# Patient Record
Sex: Female | Born: 1990
Health system: Southern US, Community
[De-identification: ages and names within clinical notes are randomized; demographics above are authoritative.]

## PROBLEM LIST (undated history)

## (undated) ENCOUNTER — Inpatient Hospital Stay (HOSPITAL_COMMUNITY): Payer: Self-pay

## (undated) DIAGNOSIS — T7801XA Anaphylactic reaction due to peanuts, initial encounter: Secondary | ICD-10-CM

## (undated) DIAGNOSIS — F329 Major depressive disorder, single episode, unspecified: Secondary | ICD-10-CM

## (undated) DIAGNOSIS — F32A Depression, unspecified: Secondary | ICD-10-CM

## (undated) DIAGNOSIS — J45909 Unspecified asthma, uncomplicated: Secondary | ICD-10-CM

## (undated) DIAGNOSIS — R51 Headache: Secondary | ICD-10-CM

## (undated) HISTORY — PX: NO PAST SURGERIES: SHX2092

---
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
## 2011-02-06 LAB — URINALYSIS, ROUTINE W REFLEX MICROSCOPIC
Bilirubin Urine: NEGATIVE
Glucose, UA: NEGATIVE mg/dL
Glucose, UA: NEGATIVE mg/dL
Hgb urine dipstick: NEGATIVE
Protein, ur: NEGATIVE mg/dL
Urobilinogen, UA: 1 mg/dL (ref 0.0–1.0)
pH: 6 (ref 5.0–8.0)

## 2011-02-06 LAB — URINE MICROSCOPIC-ADD ON

## 2011-02-06 LAB — POCT PREGNANCY, URINE: Preg Test, Ur: NEGATIVE

## 2011-11-27 NOTE — L&D Delivery Note (Signed)
Delivery Note At 6:56 AM a viable and healthy female was delivered via Vaginal, Vacuum (Extractor) due to decreased fetal heart tones not returning to baseline, no pop offs (Presentation: ; Occiput Anterior).  Spontaneous cry at delivery;  APGAR: 8, 9; weight .   Placenta status: Intact, Spontaneous.  Cord: 3 vessels with the following complications: None.  Cord pH: n/a  Anesthesia: Epidural  Episiotomy: None Lacerations: 2nd degree Suture Repair: 3.0; repaired with Monocryl Est. Blood Loss (mL): 425  Mom to postpartum.  Baby to nursery-stable.  [facility] 05/15/2012, 7:19 AM

## 2012-02-02 ENCOUNTER — Encounter (HOSPITAL_COMMUNITY): Payer: Self-pay | Admitting: *Deleted

## 2012-02-02 ENCOUNTER — Inpatient Hospital Stay (HOSPITAL_COMMUNITY)
Admission: AD | Admit: 2012-02-02 | Discharge: 2012-02-02 | Disposition: A | Discharge status: Home Health | Payer: Medicaid Other | Source: Ambulatory Visit | Attending: Obstetrics & Gynecology | Admitting: Obstetrics & Gynecology

## 2012-02-02 DIAGNOSIS — M545 Low back pain, unspecified: Secondary | ICD-10-CM

## 2012-02-02 DIAGNOSIS — R1084 Generalized abdominal pain: Secondary | ICD-10-CM

## 2012-02-02 DIAGNOSIS — O2390 Unspecified genitourinary tract infection in pregnancy, unspecified trimester: Secondary | ICD-10-CM | POA: Insufficient documentation

## 2012-02-02 DIAGNOSIS — N949 Unspecified condition associated with female genital organs and menstrual cycle: Secondary | ICD-10-CM | POA: Insufficient documentation

## 2012-02-02 DIAGNOSIS — N760 Acute vaginitis: Secondary | ICD-10-CM | POA: Insufficient documentation

## 2012-02-02 DIAGNOSIS — Z3480 Encounter for supervision of other normal pregnancy, unspecified trimester: Secondary | ICD-10-CM

## 2012-02-02 DIAGNOSIS — B9689 Other specified bacterial agents as the cause of diseases classified elsewhere: Secondary | ICD-10-CM | POA: Insufficient documentation

## 2012-02-02 DIAGNOSIS — A499 Bacterial infection, unspecified: Secondary | ICD-10-CM | POA: Insufficient documentation

## 2012-02-02 DIAGNOSIS — R109 Unspecified abdominal pain: Secondary | ICD-10-CM | POA: Insufficient documentation

## 2012-02-02 LAB — URINALYSIS, ROUTINE W REFLEX MICROSCOPIC
Glucose, UA: NEGATIVE mg/dL
Hgb urine dipstick: NEGATIVE
Leukocytes, UA: NEGATIVE
Nitrite: NEGATIVE
pH: 6 (ref 5.0–8.0)

## 2012-02-02 MED ORDER — METRONIDAZOLE 500 MG PO TABS: 500.0000 mg | ORAL_TABLET | Freq: Two times a day (BID) | ORAL | Status: AC

## 2012-02-02 NOTE — Progress Notes (Signed)
Pt states, " My last normal period was in September and I had spotting Oct, Nov, and Dec. I had a positive pregnancy test in Jan. I've been having worse low back pain and pelvic pain for the past two week."
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
## 2012-02-05 NOTE — ED Provider Notes (Signed)
Attestation of Attending Supervision of Advanced Practitioner: Evaluation and management procedures were performed by the PA/NP/CNM/OB Fellow under my supervision/collaboration. Chart reviewed, and agree with management and plan.  Jaynie Collins, M.D. 02/05/2012 10:20 AM

## 2012-03-26 ENCOUNTER — Other Ambulatory Visit (HOSPITAL_COMMUNITY): Payer: Self-pay | Admitting: Nurse Practitioner

## 2012-03-26 DIAGNOSIS — Z3689 Encounter for other specified antenatal screening: Secondary | ICD-10-CM

## 2012-03-26 LAB — OB RESULTS CONSOLE HIV ANTIBODY (ROUTINE TESTING): HIV: NONREACTIVE

## 2012-03-26 LAB — OB RESULTS CONSOLE RPR: RPR: NONREACTIVE

## 2012-03-26 LAB — OB RESULTS CONSOLE GC/CHLAMYDIA: Chlamydia: NEGATIVE

## 2012-03-27 ENCOUNTER — Ambulatory Visit (HOSPITAL_COMMUNITY)
Admission: RE | Admit: 2012-03-27 | Discharge: 2012-03-27 | Disposition: A | Discharge status: Home / Self Care | Payer: Medicaid Other | Source: Ambulatory Visit | Attending: Nurse Practitioner | Admitting: Nurse Practitioner

## 2012-03-27 DIAGNOSIS — Z363 Encounter for antenatal screening for malformations: Secondary | ICD-10-CM | POA: Insufficient documentation

## 2012-03-27 DIAGNOSIS — Z3689 Encounter for other specified antenatal screening: Secondary | ICD-10-CM

## 2012-03-27 DIAGNOSIS — O358XX0 Maternal care for other (suspected) fetal abnormality and damage, not applicable or unspecified: Secondary | ICD-10-CM | POA: Insufficient documentation

## 2012-03-27 DIAGNOSIS — Z1389 Encounter for screening for other disorder: Secondary | ICD-10-CM | POA: Insufficient documentation

## 2012-03-27 DIAGNOSIS — O0930 Supervision of pregnancy with insufficient antenatal care, unspecified trimester: Secondary | ICD-10-CM | POA: Insufficient documentation

## 2012-04-25 LAB — OB RESULTS CONSOLE GBS: GBS: NEGATIVE

## 2012-05-14 ENCOUNTER — Inpatient Hospital Stay (HOSPITAL_COMMUNITY)
Admission: AD | Admit: 2012-05-14 | Discharge: 2012-05-17 | DRG: 775 | Disposition: A | Discharge status: Home / Self Care | Payer: Medicaid Other | Source: Ambulatory Visit | Attending: Obstetrics & Gynecology | Admitting: Obstetrics & Gynecology

## 2012-05-14 ENCOUNTER — Encounter (HOSPITAL_COMMUNITY): Payer: Self-pay | Admitting: *Deleted

## 2012-05-14 DIAGNOSIS — O4290 Premature rupture of membranes, unspecified as to length of time between rupture and onset of labor, unspecified weeks of gestation: Secondary | ICD-10-CM | POA: Diagnosis present

## 2012-05-14 HISTORY — DX: Unspecified asthma, uncomplicated: J45.909

## 2012-05-14 HISTORY — DX: Headache: R51

## 2012-05-14 LAB — CBC
HCT: 36.1 % (ref 36.0–46.0)
Hemoglobin: 12 g/dL (ref 12.0–15.0)
MCHC: 33.2 g/dL (ref 30.0–36.0)
RBC: 4.4 MIL/uL (ref 3.87–5.11)

## 2012-05-14 LAB — POCT FERN TEST: Fern Test: POSITIVE

## 2012-05-14 MED ORDER — ONDANSETRON HCL 4 MG/2ML IJ SOLN
4.0000 mg | Freq: Four times a day (QID) | INTRAMUSCULAR | Status: DC | PRN
  Administered 2012-05-15: 4 mg via INTRAVENOUS
  Filled 2012-05-14: qty 2

## 2012-05-14 MED ORDER — LACTATED RINGERS IV SOLN
500.0000 mL | INTRAVENOUS | Status: DC | PRN
  Administered 2012-05-15: 300 mL via INTRAVENOUS

## 2012-05-14 MED ORDER — BUTORPHANOL TARTRATE 2 MG/ML IJ SOLN: 1.0000 mg | Freq: Once | INTRAMUSCULAR | Status: DC

## 2012-05-14 MED ORDER — LIDOCAINE HCL (PF) 1 % IJ SOLN
30.0000 mL | INTRAMUSCULAR | Status: DC | PRN
  Filled 2012-05-14: qty 30

## 2012-05-14 MED ORDER — OXYTOCIN 40 UNITS IN LACTATED RINGERS INFUSION - SIMPLE MED
62.5000 mL/h | Freq: Once | INTRAVENOUS | Status: AC
  Administered 2012-05-15: 999 mL/h via INTRAVENOUS
  Filled 2012-05-14: qty 1000

## 2012-05-14 MED ORDER — LACTATED RINGERS IV SOLN
INTRAVENOUS | Status: DC
  Administered 2012-05-14 – 2012-05-15 (×3): via INTRAVENOUS

## 2012-05-14 MED ORDER — CITRIC ACID-SODIUM CITRATE 334-500 MG/5ML PO SOLN: 30.0000 mL | ORAL | Status: DC | PRN

## 2012-05-14 MED ORDER — OXYTOCIN BOLUS FROM INFUSION
250.0000 mL | Freq: Once | INTRAVENOUS | Status: DC
  Filled 2012-05-14: qty 500

## 2012-05-14 MED ORDER — FLEET ENEMA 7-19 GM/118ML RE ENEM: 1.0000 | ENEMA | RECTAL | Status: DC | PRN

## 2012-05-14 MED ORDER — OXYCODONE-ACETAMINOPHEN 5-325 MG PO TABS
1.0000 | ORAL_TABLET | ORAL | Status: DC | PRN
Start: 2012-05-14 — End: 2012-05-15

## 2012-05-14 MED ORDER — ZOLPIDEM TARTRATE 10 MG PO TABS
10.0000 mg | ORAL_TABLET | Freq: Every evening | ORAL | Status: DC | PRN
  Administered 2012-05-14: 10 mg via ORAL
  Filled 2012-05-14: qty 1

## 2012-05-14 MED ORDER — MISOPROSTOL 25 MCG QUARTER TABLET
25.0000 ug | ORAL_TABLET | ORAL | Status: DC | PRN
  Administered 2012-05-14: 25 ug via VAGINAL
  Filled 2012-05-14: qty 0.25

## 2012-05-14 MED ORDER — BUTORPHANOL TARTRATE 2 MG/ML IJ SOLN
1.0000 mg | Freq: Once | INTRAMUSCULAR | Status: AC
  Administered 2012-05-14: 1 mg via INTRAVENOUS
  Filled 2012-05-14: qty 1

## 2012-05-14 MED ORDER — TERBUTALINE SULFATE 1 MG/ML IJ SOLN: 0.2500 mg | Freq: Once | INTRAMUSCULAR | Status: AC | PRN

## 2012-05-14 MED ORDER — ACETAMINOPHEN 325 MG PO TABS: 650.0000 mg | ORAL_TABLET | ORAL | Status: DC | PRN

## 2012-05-14 MED ORDER — IBUPROFEN 600 MG PO TABS: 600.0000 mg | ORAL_TABLET | Freq: Four times a day (QID) | ORAL | Status: DC | PRN

## 2012-05-14 NOTE — MAU Note (Signed)
Pt had large amt of pink tinged fluid @ 1445.  Has had low backache all week.

## 2012-05-14 NOTE — Progress Notes (Signed)
   Subjective: Pt requesting IV pain meds.  Objective: BP 124/86  Pulse 63  Temp 98 F (36.7 C) (Oral)  Resp 18  Ht 5\' 2"  (1.575 m)  Wt 60.328 kg (133 lb)  BMI 24.33 kg/m2  LMP 08/13/2011      FHT:  FHR: 130's bpm, variability: moderate,  accelerations:  Present,  decelerations:  Absent UC:   irregular, every 2-6 minutes SVE:   1-2  Labs: Lab Results  Component Value Date   WBC 7.2 05/14/2012   HGB 12.0 05/14/2012   HCT 36.1 05/14/2012   MCV 82.0 05/14/2012   PLT 217 05/14/2012    Assessment / Plan: Augmentation of labor, progressing well  Labor: Progressing normally Preeclampsia:  n/a Fetal Wellbeing:  Category I Pain Control:  Stadol I/D:  n/a Anticipated MOD:  NSVD  [facility] 05/14/2012, 11:39 PM

## 2012-05-14 NOTE — Progress Notes (Signed)
Patient ID: [name] [name], female   DOB: 12-Oct-1991, 21 y.o.   MRN: [id number] [name] [name] is a 21 y.o. G1P0 at [redacted]w[redacted]d admitted for PROM  Subjective: Comfortable. Unaware of UCs. Still leaking clear fluids. Hungry.  Objective: BP 127/89  Pulse 68  Temp 97.9 F (36.6 C) (Oral)  Resp 16  Ht 5\' 2"  (1.575 m)  Wt 60.328 kg (133 lb)  BMI 24.33 kg/m2  LMP 08/13/2011  Fetal Heart FHR: 125-130 bpm, variability: moderate,  accelerations:  Present,  decelerations:  Absent   Contractions: q 6 min x 60 sec  SVE:   Dilation: Closed Exam by:: Dr. Jacqulyn Bath  Assessment / Plan:  Labor: Cx unfavorable. Discussed options with pt: will eat and then proceed with cytotec for cx ripening Fetal Wellbeing: Category 1 FHR Pain Control:  N/A Expected mode of delivery: NSVD  Hamp Moreland 05/14/2012, 7:02 PM
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
## 2012-05-15 ENCOUNTER — Encounter (HOSPITAL_COMMUNITY): Payer: Self-pay | Admitting: *Deleted

## 2012-05-15 ENCOUNTER — Encounter (HOSPITAL_COMMUNITY): Payer: Self-pay | Admitting: Anesthesiology

## 2012-05-15 ENCOUNTER — Inpatient Hospital Stay (HOSPITAL_COMMUNITY): Payer: Medicaid Other | Admitting: Anesthesiology

## 2012-05-15 DIAGNOSIS — O4290 Premature rupture of membranes, unspecified as to length of time between rupture and onset of labor, unspecified weeks of gestation: Secondary | ICD-10-CM

## 2012-05-15 LAB — COMPREHENSIVE METABOLIC PANEL
ALT: 9 U/L (ref 0–35)
AST: 16 U/L (ref 0–37)
Albumin: 2.6 g/dL — ABNORMAL LOW (ref 3.5–5.2)
Alkaline Phosphatase: 146 U/L — ABNORMAL HIGH (ref 39–117)
CO2: 25 mEq/L (ref 19–32)
Chloride: 102 mEq/L (ref 96–112)
Creatinine, Ser: 0.66 mg/dL (ref 0.50–1.10)
GFR calc non Af Amer: >90 mL/min (ref >90)
Potassium: 3.8 mEq/L (ref 3.5–5.1)
Total Bilirubin: 0.3 mg/dL (ref 0.3–1.2)

## 2012-05-15 LAB — TYPE AND SCREEN: Antibody Screen: NEGATIVE

## 2012-05-15 LAB — RPR: RPR Ser Ql: NONREACTIVE

## 2012-05-15 MED ORDER — PRENATAL MULTIVITAMIN CH
1.0000 | ORAL_TABLET | Freq: Every day | ORAL | Status: DC
  Administered 2012-05-16 – 2012-05-17 (×2): 1 via ORAL
  Filled 2012-05-15 (×2): qty 1

## 2012-05-15 MED ORDER — PHENYLEPHRINE 40 MCG/ML (10ML) SYRINGE FOR IV PUSH (FOR BLOOD PRESSURE SUPPORT): 80.0000 ug | PREFILLED_SYRINGE | INTRAVENOUS | Status: DC | PRN

## 2012-05-15 MED ORDER — DIBUCAINE 1 % RE OINT: 1.0000 "application " | TOPICAL_OINTMENT | RECTAL | Status: DC | PRN

## 2012-05-15 MED ORDER — EPHEDRINE 5 MG/ML INJ
10.0000 mg | INTRAVENOUS | Status: DC | PRN
  Filled 2012-05-15: qty 4

## 2012-05-15 MED ORDER — LACTATED RINGERS IV SOLN: 500.0000 mL | Freq: Once | INTRAVENOUS | Status: DC

## 2012-05-15 MED ORDER — EPHEDRINE 5 MG/ML INJ: 10.0000 mg | INTRAVENOUS | Status: DC | PRN

## 2012-05-15 MED ORDER — BENZOCAINE-MENTHOL 20-0.5 % EX AERO
1.0000 "application " | INHALATION_SPRAY | CUTANEOUS | Status: DC | PRN
  Administered 2012-05-15: 1 via TOPICAL
  Filled 2012-05-15: qty 56

## 2012-05-15 MED ORDER — BUTORPHANOL TARTRATE 2 MG/ML IJ SOLN: 1.0000 mg | Freq: Once | INTRAMUSCULAR | Status: DC

## 2012-05-15 MED ORDER — LANOLIN HYDROUS EX OINT: TOPICAL_OINTMENT | CUTANEOUS | Status: DC | PRN

## 2012-05-15 MED ORDER — PHENYLEPHRINE 40 MCG/ML (10ML) SYRINGE FOR IV PUSH (FOR BLOOD PRESSURE SUPPORT)
80.0000 ug | PREFILLED_SYRINGE | INTRAVENOUS | Status: DC | PRN
  Filled 2012-05-15: qty 5

## 2012-05-15 MED ORDER — ZOLPIDEM TARTRATE 5 MG PO TABS: 5.0000 mg | ORAL_TABLET | Freq: Every evening | ORAL | Status: DC | PRN

## 2012-05-15 MED ORDER — ONDANSETRON HCL 4 MG PO TABS: 4.0000 mg | ORAL_TABLET | ORAL | Status: DC | PRN

## 2012-05-15 MED ORDER — DIPHENHYDRAMINE HCL 25 MG PO CAPS: 25.0000 mg | ORAL_CAPSULE | Freq: Four times a day (QID) | ORAL | Status: DC | PRN

## 2012-05-15 MED ORDER — ONDANSETRON HCL 4 MG/2ML IJ SOLN: 4.0000 mg | INTRAMUSCULAR | Status: DC | PRN

## 2012-05-15 MED ORDER — FENTANYL 2.5 MCG/ML BUPIVACAINE 1/10 % EPIDURAL INFUSION (WH - ANES)
INTRAMUSCULAR | Status: DC | PRN
  Administered 2012-05-15: 14 mL/h via EPIDURAL

## 2012-05-15 MED ORDER — IBUPROFEN 600 MG PO TABS
600.0000 mg | ORAL_TABLET | Freq: Four times a day (QID) | ORAL | Status: DC
  Administered 2012-05-15 – 2012-05-17 (×8): 600 mg via ORAL
  Filled 2012-05-15 (×8): qty 1

## 2012-05-15 MED ORDER — FENTANYL 2.5 MCG/ML BUPIVACAINE 1/10 % EPIDURAL INFUSION (WH - ANES)
14.0000 mL/h | INTRAMUSCULAR | Status: DC
  Administered 2012-05-15: 14 mL/h via EPIDURAL
  Filled 2012-05-15 (×2): qty 60

## 2012-05-15 MED ORDER — TETANUS-DIPHTH-ACELL PERTUSSIS 5-2.5-18.5 LF-MCG/0.5 IM SUSP: 0.5000 mL | Freq: Once | INTRAMUSCULAR | Status: DC

## 2012-05-15 MED ORDER — WITCH HAZEL-GLYCERIN EX PADS: 1.0000 "application " | MEDICATED_PAD | CUTANEOUS | Status: DC | PRN

## 2012-05-15 MED ORDER — OXYCODONE-ACETAMINOPHEN 5-325 MG PO TABS
1.0000 | ORAL_TABLET | ORAL | Status: DC | PRN
  Filled 2012-05-15: qty 1

## 2012-05-15 MED ORDER — SENNOSIDES-DOCUSATE SODIUM 8.6-50 MG PO TABS
2.0000 | ORAL_TABLET | Freq: Every day | ORAL | Status: DC
  Administered 2012-05-15 – 2012-05-16 (×2): 2 via ORAL

## 2012-05-15 MED ORDER — SIMETHICONE 80 MG PO CHEW: 80.0000 mg | CHEWABLE_TABLET | ORAL | Status: DC | PRN

## 2012-05-15 MED ORDER — DIPHENHYDRAMINE HCL 50 MG/ML IJ SOLN: 12.5000 mg | INTRAMUSCULAR | Status: DC | PRN

## 2012-05-15 MED ORDER — LIDOCAINE HCL (PF) 1 % IJ SOLN
INTRAMUSCULAR | Status: DC | PRN
  Administered 2012-05-15 (×2): 4 mL

## 2012-05-15 NOTE — Anesthesia Postprocedure Evaluation (Signed)
  Anesthesia Post-op Note  Patient: [name] [name]  Procedure(s) Performed: * No procedures listed *  Patient Location: Mother/Baby  Anesthesia Type: Epidural  Level of Consciousness: awake, alert  and oriented  Airway and Oxygen Therapy: Patient Spontanous Breathing  Post-op Pain: mild  Post-op Assessment: Post-op Vital signs reviewed, Patient's Cardiovascular Status Stable, No headache, No backache, No residual numbness and No residual motor weakness  Post-op Vital Signs: Reviewed and stable  Complications: No apparent anesthesia complications

## 2012-05-15 NOTE — Progress Notes (Signed)
UR chart review completed.  

## 2012-05-15 NOTE — Anesthesia Procedure Notes (Signed)
Epidural Patient location during procedure: OB Start time: 05/15/2012 2:05 AM  Staffing Anesthesiologist: Jaena Brocato A.  Preanesthetic Checklist Completed: patient identified, site marked, surgical consent, pre-op evaluation, timeout performed, IV checked, risks and benefits discussed and monitors and equipment checked  Epidural Patient position: sitting Prep: site prepped and draped and DuraPrep Patient monitoring: continuous pulse ox and blood pressure Approach: midline Injection technique: LOR air  Needle:  Needle type: Tuohy  Needle gauge: 17 G Needle length: 9 cm Needle insertion depth: 4 cm Catheter type: closed end flexible Catheter size: 19 Gauge Catheter at skin depth: 9 cm Test dose: negative and Other  Assessment Events: blood not aspirated, injection not painful, no injection resistance, negative IV test and no paresthesia  Additional Notes Patient identified. Risks and benefits discussed including failed block, incomplete  Pain control, post dural puncture headache, nerve damage, paralysis, blood pressure Changes, nausea, vomiting, reactions to medications-both toxic and allergic and post Partum back pain. All questions were answered. Patient expressed understanding and wished to proceed. Sterile technique was used throughout procedure. Epidural site was Dressed with sterile barrier dressing. No paresthesias, signs of intravascular injection Or signs of intrathecal spread were encountered.  Patient was more comfortable after the epidural was dosed. Please see RN's note for documentation of vital signs and FHR which are stable.

## 2012-05-15 NOTE — Anesthesia Preprocedure Evaluation (Signed)
Anesthesia Evaluation  Patient identified by MRN, date of birth, ID band Patient awake    Reviewed: Allergy & Precautions, H&P , Patient's Chart, lab work & pertinent test results  Airway Mallampati: III TM Distance: >3 FB Neck ROM: full    Dental No notable dental hx. (+) Teeth Intact   Pulmonary asthma ,  breath sounds clear to auscultation  Pulmonary exam normal       Cardiovascular negative cardio ROS  Rhythm:regular Rate:Normal     Neuro/Psych  Headaches, negative psych ROS   GI/Hepatic negative GI ROS, Neg liver ROS,   Endo/Other  negative endocrine ROS  Renal/GU negative Renal ROS  negative genitourinary   Musculoskeletal   Abdominal   Peds  Hematology negative hematology ROS (+)   Anesthesia Other Findings   Reproductive/Obstetrics (+) Pregnancy                           Anesthesia Physical Anesthesia Plan  ASA: II  Anesthesia Plan: Epidural   Post-op Pain Management:    Induction:   Airway Management Planned:   Additional Equipment:   Intra-op Plan:   Post-operative Plan:   Informed Consent: I have reviewed the patients History and Physical, chart, labs and discussed the procedure including the risks, benefits and alternatives for the proposed anesthesia with the patient or authorized representative who has indicated his/her understanding and acceptance.     Plan Discussed with: Anesthesiologist  Anesthesia Plan Comments:         Anesthesia Quick Evaluation

## 2012-05-15 NOTE — Progress Notes (Signed)
   Subjective: Pt sleeping.  Objective: BP 138/98  Pulse 62  Temp 97.8 F (36.6 C) (Axillary)  Resp 18  Ht 5\' 2"  (1.575 m)  Wt 60.328 kg (133 lb)  BMI 24.33 kg/m2  LMP 08/13/2011   Total I/O In: -  Out: 300 [Urine:300]  FHT:  FHR: 110's bpm, variability: minimal ,  accelerations:  Present,  decelerations:  Present early decel, intermittent decel UC:   irregular, every 1-7 minutes SVE:   Dilation: 4 Effacement (%): 90 Station: -1 Exam by:: A.Davis,RN  Labs: Lab Results  Component Value Date   WBC 7.2 05/14/2012   HGB 12.0 05/14/2012   HCT 36.1 05/14/2012   MCV 82.0 05/14/2012   PLT 217 05/14/2012    Assessment / Plan: Augmentation of labor, progressing well  Labor: Progressing normally Preeclampsia:  n/a Fetal Wellbeing:  Category II Pain Control:  Epidural I/D:  n/a Anticipated MOD:  NSVD  [facility] 05/15/2012, 4:50 AM

## 2012-05-15 NOTE — Progress Notes (Signed)
   Subjective: Pt reports increased pain, but declines epidural, wants to wait until "later".    Objective: BP 124/86  Pulse 63  Temp 98.2 F (36.8 C) (Oral)  Resp 20  Ht 5\' 2"  (1.575 m)  Wt 60.328 kg (133 lb)  BMI 24.33 kg/m2  LMP 08/13/2011      FHT:  FHR: 120's bpm, variability: moderate,  accelerations:  Present,  decelerations:  Absent UC:   irregular, every 1-7 minutes SVE:   Dilation: Closed Effacement (%): Thick Station: -2 Exam by:: A.Davis,RN  Labs: Lab Results  Component Value Date   WBC 7.2 05/14/2012   HGB 12.0 05/14/2012   HCT 36.1 05/14/2012   MCV 82.0 05/14/2012   PLT 217 05/14/2012    Assessment / Plan: Augmentation of labor, progressing well  Labor: Progressing normally Preeclampsia:  n/a Fetal Wellbeing:  Category I Pain Control:  Stadol I/D:  n/a Anticipated MOD:  NSVD  MUHAMMAD,Karin Griffith 05/15/2012, 1:07 AM

## 2012-05-16 LAB — CBC
HCT: 27.4 % — ABNORMAL LOW (ref 36.0–46.0)
Hemoglobin: 9.1 g/dL — ABNORMAL LOW (ref 12.0–15.0)
MCH: 27.7 pg (ref 26.0–34.0)
MCV: 83.3 fL (ref 78.0–100.0)
Platelets: 158 10*3/uL (ref 150–400)
RBC: 3.29 MIL/uL — ABNORMAL LOW (ref 3.87–5.11)
WBC: 12.4 10*3/uL — ABNORMAL HIGH (ref 4.0–10.5)

## 2012-05-16 NOTE — Progress Notes (Signed)
Patient ID: [name] [name], female   DOB: [birth year], 21 y.o.   MRN: [id number] Medical Student Daily Progress Note  Subjective: Patient is s/p VAVD PPD#1 and doing well. She slept well overnight with no complaints. Patient does still note some minor vaginal bleeding, but no increase in bleeding. She denies any fevers, chills, night sweats, N/V/D, or changes in bowel / bladder. She is resting comfortably in bed upon exam with baby bedside.   Objective: Vital signs in last 24 hours: Filed Vitals:   05/15/12 1115 05/15/12 1601 05/15/12 2112 05/16/12 0533  BP: 115/75 114/73 115/76 108/65  Pulse: 64 77 102 65  Temp: 98 F (36.7 C) 98.3 F (36.8 C) 98.2 F (36.8 C) 98.4 F (36.9 C)  TempSrc: Oral Oral Oral Oral  Resp: 18 18 18 18   Height:      Weight:      SpO2:  99%     Weight change:   Intake/Output Summary (Last 24 hours) at 05/16/12 0726 Last data filed at 05/15/12 0831  Gross per 24 hour  Intake      0 ml  Output    725 ml  Net   -725 ml   Physical Exam: BP 108/65  Pulse 65  Temp 98.4 F (36.9 C) (Oral)  Resp 18  Ht 5\' 2"  (1.575 m)  Wt 60.328 kg (133 lb)  BMI 24.33 kg/m2  SpO2 99%  LMP 08/13/2011  Breastfeeding? Unknown General appearance: alert, cooperative and no distress Head: Normocephalic, without obvious abnormality, atraumatic Eyes: conjunctiva/cornea clear Back: symmetric, no curvature. ROM normal. No CVA tenderness. Lungs: clear to auscultation bilaterally Heart: regular rate and rhythm, S1, S2 normal, no murmur, click, rub or gallop Abdomen: soft, non-tender; bowel sounds normal; no masses,  no organomegaly Extremities: extremities normal, atraumatic, no cyanosis or edema Skin: Skin color, texture, turgor normal. No rashes or lesions Lab Results: Basic Metabolic Panel:  Lab 05/15/12 4098  NA 135  K 3.8  CL 102  CO2 25  GLUCOSE 108*  BUN 5*  CREATININE 0.66  CALCIUM 9.2  MG --  PHOS --   Liver Function Tests:  Lab 05/15/12 0609  AST 16    ALT 9  ALKPHOS 146*  BILITOT 0.3  PROT 6.1  ALBUMIN 2.6*   No results found for this basename: LIPASE:2,AMYLASE:2 in the last 168 hours No results found for this basename: AMMONIA:2 in the last 168 hours CBC:  Lab 05/16/12 0607 05/14/12 1630  WBC 12.4* 7.2  NEUTROABS -- --  HGB 9.1* 12.0  HCT 27.4* 36.1  MCV 83.3 82.0  PLT 158 217   Medications:  I have reviewed the patient's current medications . Prior to Admission:  Prescriptions prior to admission  Medication Sig Dispense Refill  . Prenatal Vit-Fe Fumarate-FA (PRENATAL MULTIVITAMIN) TABS Take 1 tablet by mouth daily.      Marland Kitchen DISCONTD: Prenatal Vit-Fe Sulfate-FA (PRENATAL VITAMIN PO) Take by mouth.       Scheduled Meds:   . ibuprofen  600 mg Oral Q6H  . prenatal multivitamin  1 tablet Oral Daily  . senna-docusate  2 tablet Oral QHS  . TDaP  0.5 mL Intramuscular Once  . DISCONTD: butorphanol  1 mg Intravenous Once  . DISCONTD: lactated ringers  500 mL Intravenous Once  . DISCONTD: oxytocin 40 units in LR 1000 mL  250 mL Intravenous Once   Continuous Infusions:   . DISCONTD: fentaNYL 2.5 mcg/ml/ bupivacaine 1/10% 14 mL/hr (05/15/12 0550)  . DISCONTD: lactated ringers 125 mL/hr  at 05/15/12 0420   PRN Meds:.benzocaine-Menthol, dibucaine, diphenhydrAMINE, lanolin, ondansetron (ZOFRAN) IV, ondansetron, oxyCODONE-acetaminophen, simethicone, witch hazel-glycerin, zolpidem, DISCONTD: acetaminophen, DISCONTD: citric acid-sodium citrate, DISCONTD: diphenhydrAMINE, DISCONTD: ePHEDrine, DISCONTD: ePHEDrine, DISCONTD: ibuprofen, DISCONTD: lactated ringers, DISCONTD: lidocaine, DISCONTD: misoprostol, DISCONTD: ondansetron, DISCONTD: oxyCODONE-acetaminophen DISCONTD: phenylephrine, DISCONTD: phenylephrine, DISCONTD: sodium phosphate, DISCONTD: zolpidem Assessment/Plan:  VAVD - Patient doing well with no complaints.  Plan: - Patient to bottle feed - Depo-Provera for contraception - d/c tomorrow (05/17/12) pending no  complaints    LOS: 2 days   This is a Psychologist, occupational Note.  The care of the patient was discussed with Maylon Cos, CNM and the assessment and plan formulated with their assistance.  Please see their attached note for official documentation of the daily encounter.  Lewie Chamber 05/16/2012, 7:26 AM  I have seen/examined this patient and agree with the above assessment and plan. Disney Ruggiero E. 10:53 AM
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
## 2012-05-17 MED ORDER — IBUPROFEN 600 MG PO TABS: 600.0000 mg | ORAL_TABLET | Freq: Four times a day (QID) | ORAL | Status: AC

## 2012-05-17 NOTE — Discharge Summary (Signed)
Obstetric Discharge Summary Reason for Admission: onset of labor and rupture of membranes Prenatal Procedures: ultrasound Intrapartum Procedures: vacuum assisted vaginal delivery for FHR decelerations Postpartum Procedures: none Complications-Operative and Postpartum: none Hemoglobin  Date Value Range Status  05/16/2012 9.1* 12.0 - 15.0 g/dL Final     DELTA CHECK NOTED     REPEATED TO VERIFY     HCT  Date Value Range Status  05/16/2012 27.4* 36.0 - 46.0 % Final    Physical Exam:  General: alert, cooperative and no distress Lochia: appropriate Uterine Fundus: firm Incision: N/A DVT Evaluation: No evidence of DVT seen on physical exam. Negative Homan's sign. No cords or calf tenderness. No significant calf/ankle edema.  Discharge Diagnoses: Term Pregnancy-delivered  Discharge Information: Date: 05/17/2012 Activity: pelvic rest Diet: routine Medications: Ibuprofen Condition: stable Instructions: refer to practice specific booklet Discharge to: home   Newborn Data: Live born female  Birth Weight: 6 lb 6 oz (2892 g) APGAR: 8, 9  Home with mother.  [name] [name] 05/17/2012, 6:55 AM

## 2012-05-17 NOTE — Discharge Instructions (Signed)

## 2012-06-15 ENCOUNTER — Emergency Department (INDEPENDENT_AMBULATORY_CARE_PROVIDER_SITE_OTHER): Payer: Medicaid Other

## 2012-06-15 ENCOUNTER — Encounter (HOSPITAL_COMMUNITY): Payer: Self-pay | Admitting: *Deleted

## 2012-06-15 ENCOUNTER — Emergency Department (INDEPENDENT_AMBULATORY_CARE_PROVIDER_SITE_OTHER)
Admission: EM | Admit: 2012-06-15 | Discharge: 2012-06-15 | Disposition: A | Discharge status: Home / Self Care | Payer: Medicaid Other | Source: Home / Self Care | Attending: Emergency Medicine | Admitting: Emergency Medicine

## 2012-06-15 DIAGNOSIS — S161XXA Strain of muscle, fascia and tendon at neck level, initial encounter: Secondary | ICD-10-CM

## 2012-06-15 DIAGNOSIS — G43909 Migraine, unspecified, not intractable, without status migrainosus: Secondary | ICD-10-CM

## 2012-06-15 DIAGNOSIS — S335XXA Sprain of ligaments of lumbar spine, initial encounter: Secondary | ICD-10-CM

## 2012-06-15 DIAGNOSIS — T148XXA Other injury of unspecified body region, initial encounter: Secondary | ICD-10-CM

## 2012-06-15 DIAGNOSIS — S139XXA Sprain of joints and ligaments of unspecified parts of neck, initial encounter: Secondary | ICD-10-CM

## 2012-06-15 DIAGNOSIS — S39012A Strain of muscle, fascia and tendon of lower back, initial encounter: Secondary | ICD-10-CM

## 2012-06-15 MED ORDER — TRAMADOL HCL 50 MG PO TABS: 100.0000 mg | ORAL_TABLET | Freq: Three times a day (TID) | ORAL | Status: AC | PRN

## 2012-06-15 MED ORDER — METHOCARBAMOL 500 MG PO TABS: 500.0000 mg | ORAL_TABLET | Freq: Three times a day (TID) | ORAL | Status: AC

## 2012-06-15 MED ORDER — SUMATRIPTAN SUCCINATE 50 MG PO TABS: 50.0000 mg | ORAL_TABLET | ORAL | Status: DC | PRN
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
## 2014-09-27 ENCOUNTER — Encounter (HOSPITAL_COMMUNITY): Payer: Self-pay | Admitting: *Deleted

## 2015-03-03 ENCOUNTER — Emergency Department (HOSPITAL_COMMUNITY)
Admission: EM | Admit: 2015-03-03 | Discharge: 2015-03-03 | Disposition: A | Discharge status: Home / Self Care | Payer: Medicaid Other | Attending: Emergency Medicine | Admitting: Emergency Medicine

## 2015-03-03 ENCOUNTER — Emergency Department (HOSPITAL_COMMUNITY): Payer: Medicaid Other

## 2015-03-03 ENCOUNTER — Encounter (HOSPITAL_COMMUNITY): Payer: Self-pay | Admitting: Emergency Medicine

## 2015-03-03 DIAGNOSIS — Y9289 Other specified places as the place of occurrence of the external cause: Secondary | ICD-10-CM | POA: Insufficient documentation

## 2015-03-03 DIAGNOSIS — Y998 Other external cause status: Secondary | ICD-10-CM | POA: Insufficient documentation

## 2015-03-03 DIAGNOSIS — W010XXA Fall on same level from slipping, tripping and stumbling without subsequent striking against object, initial encounter: Secondary | ICD-10-CM | POA: Insufficient documentation

## 2015-03-03 DIAGNOSIS — S299XXA Unspecified injury of thorax, initial encounter: Secondary | ICD-10-CM | POA: Insufficient documentation

## 2015-03-03 DIAGNOSIS — Z79899 Other long term (current) drug therapy: Secondary | ICD-10-CM | POA: Insufficient documentation

## 2015-03-03 DIAGNOSIS — J45909 Unspecified asthma, uncomplicated: Secondary | ICD-10-CM | POA: Diagnosis not present

## 2015-03-03 DIAGNOSIS — Y9389 Activity, other specified: Secondary | ICD-10-CM | POA: Insufficient documentation

## 2015-03-03 DIAGNOSIS — R0781 Pleurodynia: Secondary | ICD-10-CM

## 2015-03-03 MED ORDER — IBUPROFEN 400 MG PO TABS
600.0000 mg | ORAL_TABLET | Freq: Once | ORAL | Status: AC
  Administered 2015-03-03: 600 mg via ORAL
  Filled 2015-03-03: qty 2

## 2015-03-03 MED ORDER — TRAMADOL HCL 50 MG PO TABS
50.0000 mg | ORAL_TABLET | Freq: Once | ORAL | Status: AC
  Administered 2015-03-03: 50 mg via ORAL
  Filled 2015-03-03: qty 1

## 2015-03-03 MED ORDER — IBUPROFEN 600 MG PO TABS: 600.0000 mg | ORAL_TABLET | Freq: Four times a day (QID) | ORAL | Status: DC | PRN

## 2015-03-03 MED ORDER — TRAMADOL HCL 50 MG PO TABS: 50.0000 mg | ORAL_TABLET | Freq: Four times a day (QID) | ORAL | Status: DC | PRN

## 2015-03-03 MED ORDER — CYCLOBENZAPRINE HCL 10 MG PO TABS: 10.0000 mg | ORAL_TABLET | Freq: Two times a day (BID) | ORAL | Status: DC | PRN
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
## 2015-03-03 NOTE — ED Notes (Signed)
Pt reports slipping in bath tub 3 days ago- pt has had constant right rib pain since falling- lung sounds clear.  Pt reports waking up this morning with right sided neck pain- denies numbness or tingling to extremities.

## 2015-03-03 NOTE — ED Notes (Signed)
ccollar in triage

## 2015-06-07 ENCOUNTER — Encounter (HOSPITAL_COMMUNITY): Payer: Self-pay | Admitting: Emergency Medicine

## 2015-06-07 ENCOUNTER — Emergency Department (HOSPITAL_COMMUNITY)
Admission: EM | Admit: 2015-06-07 | Discharge: 2015-06-07 | Disposition: A | Discharge status: Home / Self Care | Payer: Medicaid Other | Attending: Emergency Medicine | Admitting: Emergency Medicine

## 2015-06-07 DIAGNOSIS — R1030 Lower abdominal pain, unspecified: Secondary | ICD-10-CM | POA: Diagnosis not present

## 2015-06-07 DIAGNOSIS — R42 Dizziness and giddiness: Secondary | ICD-10-CM | POA: Insufficient documentation

## 2015-06-07 DIAGNOSIS — R5383 Other fatigue: Secondary | ICD-10-CM | POA: Insufficient documentation

## 2015-06-07 DIAGNOSIS — R110 Nausea: Secondary | ICD-10-CM | POA: Diagnosis not present

## 2015-06-07 DIAGNOSIS — J45909 Unspecified asthma, uncomplicated: Secondary | ICD-10-CM | POA: Insufficient documentation

## 2015-06-07 DIAGNOSIS — H53143 Visual discomfort, bilateral: Secondary | ICD-10-CM | POA: Insufficient documentation

## 2015-06-07 DIAGNOSIS — R1084 Generalized abdominal pain: Secondary | ICD-10-CM | POA: Diagnosis present

## 2015-06-07 DIAGNOSIS — R51 Headache: Secondary | ICD-10-CM | POA: Insufficient documentation

## 2015-06-07 DIAGNOSIS — G8929 Other chronic pain: Secondary | ICD-10-CM

## 2015-06-07 DIAGNOSIS — Z79899 Other long term (current) drug therapy: Secondary | ICD-10-CM | POA: Insufficient documentation

## 2015-06-07 LAB — URINE MICROSCOPIC-ADD ON

## 2015-06-07 LAB — CBC WITH DIFFERENTIAL/PLATELET
BASOS ABS: 0 10*3/uL (ref 0.0–0.1)
Basophils Relative: 1 % (ref 0–1)
Eosinophils Absolute: 0.2 10*3/uL (ref 0.0–0.7)
Eosinophils Relative: 3 % (ref 0–5)
HEMATOCRIT: 35.2 % — AB (ref 36.0–46.0)
Hemoglobin: 11.2 g/dL — ABNORMAL LOW (ref 12.0–15.0)
Lymphocytes Relative: 29 % (ref 12–46)
Lymphs Abs: 1.7 10*3/uL (ref 0.7–4.0)
MCH: 24.9 pg — AB (ref 26.0–34.0)
MCHC: 31.8 g/dL (ref 30.0–36.0)
MCV: 78.2 fL (ref 78.0–100.0)
MONO ABS: 0.4 10*3/uL (ref 0.1–1.0)
MONOS PCT: 6 % (ref 3–12)
Neutro Abs: 3.7 10*3/uL (ref 1.7–7.7)
Neutrophils Relative %: 61 % (ref 43–77)
PLATELETS: 244 10*3/uL (ref 150–400)
RBC: 4.5 MIL/uL (ref 3.87–5.11)
RDW: 14.9 % (ref 11.5–15.5)
WBC: 6 10*3/uL (ref 4.0–10.5)

## 2015-06-07 LAB — URINALYSIS, ROUTINE W REFLEX MICROSCOPIC
Bilirubin Urine: NEGATIVE
Glucose, UA: NEGATIVE mg/dL
KETONES UR: NEGATIVE mg/dL
Nitrite: NEGATIVE
PROTEIN: NEGATIVE mg/dL
SPECIFIC GRAVITY, URINE: 1.011 (ref 1.005–1.030)
Urobilinogen, UA: 0.2 mg/dL (ref 0.0–1.0)
pH: 6 (ref 5.0–8.0)

## 2015-06-07 LAB — COMPREHENSIVE METABOLIC PANEL
ALBUMIN: 3.7 g/dL (ref 3.5–5.0)
ALK PHOS: 71 U/L (ref 38–126)
ALT: 14 U/L (ref 14–54)
ANION GAP: 5 (ref 5–15)
AST: 18 U/L (ref 15–41)
BILIRUBIN TOTAL: 0.6 mg/dL (ref 0.3–1.2)
BUN: 12 mg/dL (ref 6–20)
CO2: 27 mmol/L (ref 22–32)
Calcium: 8.9 mg/dL (ref 8.9–10.3)
Chloride: 106 mmol/L (ref 101–111)
Creatinine, Ser: 0.73 mg/dL (ref 0.44–1.00)
Glucose, Bld: 83 mg/dL (ref 65–99)
Potassium: 3.9 mmol/L (ref 3.5–5.1)
SODIUM: 138 mmol/L (ref 135–145)
Total Protein: 6.7 g/dL (ref 6.5–8.1)

## 2015-06-07 LAB — RAPID URINE DRUG SCREEN, HOSP PERFORMED
AMPHETAMINES: NOT DETECTED
BENZODIAZEPINES: NOT DETECTED
Barbiturates: NOT DETECTED
COCAINE: NOT DETECTED
Opiates: NOT DETECTED
Tetrahydrocannabinol: NOT DETECTED

## 2015-06-07 LAB — I-STAT BETA HCG BLOOD, ED (MC, WL, AP ONLY)

## 2015-06-07 LAB — ETHANOL: Alcohol, Ethyl (B): <5 mg/dL (ref <5)

## 2015-06-07 LAB — LIPASE, BLOOD: LIPASE: 15 U/L — AB (ref 22–51)

## 2015-06-07 MED ORDER — METOCLOPRAMIDE HCL 10 MG PO TABS: 10.0000 mg | ORAL_TABLET | Freq: Four times a day (QID) | ORAL | Status: DC | PRN

## 2015-06-07 MED ORDER — METOCLOPRAMIDE HCL 5 MG/ML IJ SOLN
10.0000 mg | Freq: Once | INTRAMUSCULAR | Status: AC
  Administered 2015-06-07: 10 mg via INTRAVENOUS
  Filled 2015-06-07: qty 2

## 2015-06-07 MED ORDER — SODIUM CHLORIDE 0.9 % IV BOLUS (SEPSIS)
1000.0000 mL | Freq: Once | INTRAVENOUS | Status: AC
  Administered 2015-06-07: 1000 mL via INTRAVENOUS

## 2015-06-07 MED ORDER — KETOROLAC TROMETHAMINE 30 MG/ML IJ SOLN
30.0000 mg | Freq: Once | INTRAMUSCULAR | Status: AC
  Administered 2015-06-07: 30 mg via INTRAVENOUS
  Filled 2015-06-07: qty 1

## 2015-06-07 MED ORDER — NAPROXEN 500 MG PO TABS: 500.0000 mg | ORAL_TABLET | Freq: Two times a day (BID) | ORAL | Status: DC | PRN

## 2015-06-07 NOTE — ED Notes (Signed)
Pt ambulated with PA and RN in room, steady gait, NAD.  Pt back in bed resting. VSS.

## 2015-06-07 NOTE — ED Notes (Signed)
Was picking up son from child care center; 3 days of headache; migraine medicine not helping. Diffuse lower Q belly pain; hx of hernia; upon standing she became dizzy. Decreased urinary output; ortho static per EMS. Pregnancy test negative.

## 2015-06-07 NOTE — ED Notes (Signed)
Patient denies pain and is resting comfortably.  

## 2015-06-07 NOTE — Discharge Instructions (Signed)
Your migraine was likely related to the fact that you hadn't slept well recently, and your lightheadedness was likely from dehydration and fatigue. Use tylenol or naprosyn as needed for headaches, and use your home sumatriptan as needed for migraines. Use reglan as needed for nausea and headache, stay well hydrated with plenty of fluids. It's important to get plenty of rest. Follow up with your regular doctor listed on your medicaid card in 1 week for recheck of symptoms. Return to the ER for changes or worsening symptoms.   Migraine Headache A migraine headache is very bad, throbbing pain on one or both sides of your head. Talk to your doctor about what things may bring on (trigger) your migraine headaches. HOME CARE  Only take medicines as told by your doctor.  Lie down in a dark, quiet room when you have a migraine.  Keep a journal to find out if certain things bring on migraine headaches. For example, write down:  What you eat and drink.  How much sleep you get.  Any change to your diet or medicines.  Lessen how much alcohol you drink.  Quit smoking if you smoke.  Get enough sleep.  Lessen any stress in your life.  Keep lights dim if bright lights bother you or make your migraines worse. GET HELP RIGHT AWAY IF:   Your migraine becomes really bad.  You have a fever.  You have a stiff neck.  You have trouble seeing.  Your muscles are weak, or you lose muscle control.  You lose your balance or have trouble walking.  You feel like you will pass out (faint), or you pass out.  You have really bad symptoms that are different than your first symptoms. MAKE SURE YOU:   Understand these instructions.  Will watch your condition.  Will get help right away if you are not doing well or get worse. Document Released: 08/21/2008 Document Revised: 02/04/2012 Document Reviewed: 07/20/2013 [facility] Patient Information 2015 [place], [place]. This information is not intended to  replace advice given to you by your health care provider. Make sure you discuss any questions you have with your health care provider.  Nausea, Adult Nausea is the feeling that you have an upset stomach or have to vomit. Nausea by itself is not likely a serious concern, but it may be an early sign of more serious medical problems. As nausea gets worse, it can lead to vomiting. If vomiting develops, there is the risk of dehydration.  CAUSES   Viral infections.  Food poisoning.  Medicines.  Pregnancy.  Motion sickness.  Migraine headaches.  Emotional distress.  Severe pain from any source.  Alcohol intoxication. HOME CARE INSTRUCTIONS  Get plenty of rest.  Ask your caregiver about specific rehydration instructions.  Eat small amounts of food and sip liquids more often.  Take all medicines as told by your caregiver. SEEK MEDICAL CARE IF:  You have not improved after 2 days, or you get worse.  You have a headache. SEEK IMMEDIATE MEDICAL CARE IF:   You have a fever.  You faint.  You keep vomiting or have blood in your vomit.  You are extremely weak or dehydrated.  You have dark or bloody stools.  You have severe chest or abdominal pain. MAKE SURE YOU:  Understand these instructions.  Will watch your condition.  Will get help right away if you are not doing well or get worse. Document Released: 12/20/2004 Document Revised: 08/06/2012 Document Reviewed: 07/25/2011 Exit[employer] Patient Information 2015 [place],  [place]. This information is not intended to replace advice given to you by your health care provider. Make sure you discuss any questions you have with your health care provider.  Orthostatic Hypotension Orthostatic hypotension is a sudden drop in blood pressure. It happens when you quickly stand up from a seated or lying position. You may feel dizzy or light-headed. This can last for just a few seconds or for up to a few minutes. It is usually not a serious  problem. However, if this happens frequently or gets worse, it can be a sign of something more serious. CAUSES  Different things can cause orthostatic hypotension, including:   Loss of body fluids (dehydration).  Medicines that lower blood pressure.  Sudden changes in posture, such as standing up quickly after you have been sitting or lying down.  Taking too much of your medicine. SIGNS AND SYMPTOMS   Light-headedness or dizziness.   Fainting or near-fainting.   A fast heart rate.   Weakness.   Feeling tired (fatigue).  DIAGNOSIS  Your health care provider may do several things to help diagnose your condition and identify the cause. These may include:   Taking a medical history and doing a physical exam.  Checking your blood pressure. Your health care provider will check your blood pressure when you are:  Lying down.  Sitting.  Standing.  Using tilt table testing. In this test, you lie down on a table that moves from a lying position to a standing position. You will be strapped onto the table. This test monitors your blood pressure and heart rate when you are in different positions. TREATMENT  Treatment will vary depending on the cause. Possible treatments include:   Changing the dosage of your medicines.  Wearing compression stockings on your lower legs.  Standing up slowly after sitting or lying down.  Eating more salt.  Eating frequent, small meals.  In some cases, getting IV fluids.  Taking medicine to enhance fluid retention. HOME CARE INSTRUCTIONS  Only take over-the-counter or prescription medicines as directed by your health care provider.  Follow your health care provider's instructions for changing the dosage of your current medicines.  Do not stop or adjust your medicine on your own.  Stand up slowly after sitting or lying down. This allows your body to adjust to the different position.  Wear compression stockings as directed.  Eat  extra salt as directed.  Do not add extra salt to your diet unless directed to by your health care provider.  Eat frequent, small meals.  Avoid standing suddenly after eating.  Avoid hot showers or excessive heat as directed by your health care provider.  Keep all follow-up appointments. SEEK MEDICAL CARE IF:  You continue to feel dizzy or light-headed after standing.  You feel groggy or confused.  You feel cold, clammy, or sick to your stomach (nauseous).  You have blurred vision.  You feel short of breath. SEEK IMMEDIATE MEDICAL CARE IF:   You faint after standing.  You have chest pain.  You have difficulty breathing.   You lose feeling or movement in your arms or legs.   You have slurred speech or difficulty talking, or you are unable to talk.  MAKE SURE YOU:   Understand these instructions.  Will watch your condition.  Will get help right away if you are not doing well or get worse. Document Released: 11/02/2002 Document Revised: 11/17/2013 Document Reviewed: 09/04/2013 [facility] Patient Information 2015 [place]. This information is not intended  to replace advice given to you by your health care provider. Make sure you discuss any questions you have with your health care provider. ° °
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
## 2015-06-14 ENCOUNTER — Encounter (HOSPITAL_COMMUNITY): Payer: Self-pay | Admitting: *Deleted

## 2015-06-14 DIAGNOSIS — J45909 Unspecified asthma, uncomplicated: Secondary | ICD-10-CM | POA: Diagnosis not present

## 2015-06-14 DIAGNOSIS — Z720 Tobacco use: Secondary | ICD-10-CM | POA: Insufficient documentation

## 2015-06-14 DIAGNOSIS — R51 Headache: Secondary | ICD-10-CM | POA: Insufficient documentation

## 2015-06-14 NOTE — ED Notes (Signed)
Patient presents stating her migraine started last night.

## 2015-06-14 NOTE — ED Notes (Signed)
EMS reports BP 104/62, P 108, R 19, Sats 100% RA, CO 3

## 2015-06-15 ENCOUNTER — Emergency Department (HOSPITAL_COMMUNITY)
Admission: EM | Admit: 2015-06-15 | Discharge: 2015-06-15 | Discharge status: Against Medical Advice | Payer: Medicaid Other | Attending: Emergency Medicine | Admitting: Emergency Medicine

## 2015-06-15 NOTE — ED Notes (Signed)
Pt stated she was leaving AMA.  Her baby sitter had to leave and she had to go home to her child.

## 2015-09-16 ENCOUNTER — Encounter (HOSPITAL_COMMUNITY): Payer: Self-pay | Admitting: Cardiology

## 2015-09-16 ENCOUNTER — Emergency Department (HOSPITAL_COMMUNITY)
Admission: EM | Admit: 2015-09-16 | Discharge: 2015-09-16 | Disposition: A | Discharge status: Home / Self Care | Payer: Medicaid Other | Attending: Emergency Medicine | Admitting: Emergency Medicine

## 2015-09-16 DIAGNOSIS — Z87891 Personal history of nicotine dependence: Secondary | ICD-10-CM | POA: Insufficient documentation

## 2015-09-16 DIAGNOSIS — K439 Ventral hernia without obstruction or gangrene: Secondary | ICD-10-CM

## 2015-09-16 DIAGNOSIS — K4091 Unilateral inguinal hernia, without obstruction or gangrene, recurrent: Secondary | ICD-10-CM

## 2015-09-16 DIAGNOSIS — R102 Pelvic and perineal pain: Secondary | ICD-10-CM | POA: Diagnosis present

## 2015-09-16 DIAGNOSIS — J45909 Unspecified asthma, uncomplicated: Secondary | ICD-10-CM | POA: Insufficient documentation

## 2015-09-16 DIAGNOSIS — Z3202 Encounter for pregnancy test, result negative: Secondary | ICD-10-CM | POA: Insufficient documentation

## 2015-09-16 LAB — URINE MICROSCOPIC-ADD ON

## 2015-09-16 LAB — URINALYSIS, ROUTINE W REFLEX MICROSCOPIC
BILIRUBIN URINE: NEGATIVE
Glucose, UA: NEGATIVE mg/dL
Ketones, ur: NEGATIVE mg/dL
NITRITE: NEGATIVE
PROTEIN: NEGATIVE mg/dL
SPECIFIC GRAVITY, URINE: 1.021 (ref 1.005–1.030)
UROBILINOGEN UA: 0.2 mg/dL (ref 0.0–1.0)
pH: 6 (ref 5.0–8.0)

## 2015-09-16 LAB — COMPREHENSIVE METABOLIC PANEL
ALK PHOS: 67 U/L (ref 38–126)
ALT: 17 U/L (ref 14–54)
ANION GAP: 6 (ref 5–15)
AST: 19 U/L (ref 15–41)
Albumin: 3.7 g/dL (ref 3.5–5.0)
BILIRUBIN TOTAL: 0.3 mg/dL (ref 0.3–1.2)
BUN: 11 mg/dL (ref 6–20)
CALCIUM: 8.9 mg/dL (ref 8.9–10.3)
CO2: 28 mmol/L (ref 22–32)
Chloride: 104 mmol/L (ref 101–111)
Creatinine, Ser: 0.75 mg/dL (ref 0.44–1.00)
Glucose, Bld: 89 mg/dL (ref 65–99)
POTASSIUM: 4 mmol/L (ref 3.5–5.1)
Sodium: 138 mmol/L (ref 135–145)
TOTAL PROTEIN: 6.6 g/dL (ref 6.5–8.1)

## 2015-09-16 LAB — CBC
HEMATOCRIT: 35.6 % — AB (ref 36.0–46.0)
HEMOGLOBIN: 11.1 g/dL — AB (ref 12.0–15.0)
MCH: 25.1 pg — ABNORMAL LOW (ref 26.0–34.0)
MCHC: 31.2 g/dL (ref 30.0–36.0)
MCV: 80.4 fL (ref 78.0–100.0)
Platelets: 319 10*3/uL (ref 150–400)
RBC: 4.43 MIL/uL (ref 3.87–5.11)
RDW: 15.2 % (ref 11.5–15.5)
WBC: 4.9 10*3/uL (ref 4.0–10.5)

## 2015-09-16 LAB — LIPASE, BLOOD: Lipase: 23 U/L (ref 11–51)

## 2015-09-16 LAB — POC URINE PREG, ED: PREG TEST UR: NEGATIVE

## 2015-09-16 MED ORDER — ACETAMINOPHEN 500 MG PO TABS
500.0000 mg | ORAL_TABLET | Freq: Once | ORAL | Status: AC
  Administered 2015-09-16: 500 mg via ORAL
  Filled 2015-09-16: qty 1

## 2015-09-16 MED ORDER — NAPROXEN 500 MG PO TABS: 500.0000 mg | ORAL_TABLET | Freq: Two times a day (BID) | ORAL | Status: DC | PRN

## 2015-09-16 NOTE — Discharge Instructions (Signed)
Hernia, Adult °A hernia is the bulging of an organ or tissue through a weak spot in the muscles of the abdomen (abdominal wall). Hernias develop most often near the navel or groin. °There are many kinds of hernias. Common kinds include: °· Femoral hernia. This kind of hernia develops under the groin in the upper thigh area. °· Inguinal hernia. This kind of hernia develops in the groin or scrotum. °· Umbilical hernia. This kind of hernia develops near the navel. °· Hiatal hernia. This kind of hernia causes part of the stomach to be pushed up into the chest. °· Incisional hernia. This kind of hernia bulges through a scar from an abdominal surgery. °CAUSES °This condition may be caused by: °· Heavy lifting. °· Coughing over a long period of time. °· Straining to have a bowel movement. °· An incision made during an abdominal surgery. °· A birth defect (congenital defect). °· Excess weight or obesity. °· Smoking. °· Poor nutrition. °· Cystic fibrosis. °· Excess fluid in the abdomen. °· Undescended testicles. °SYMPTOMS °Symptoms of a hernia include: °· A lump on the abdomen. This is the first sign of a hernia. The lump may become more obvious with standing, straining, or coughing. It may get bigger over time if it is not treated or if the condition causing it is not treated. °· Pain. A hernia is usually painless, but it may become painful over time if treatment is delayed. The pain is usually dull and may get worse with standing or lifting heavy objects. °Sometimes a hernia gets tightly squeezed in the weak spot (strangulated) or stuck there (incarcerated) and causes additional symptoms. These symptoms may include: °· Vomiting. °· Nausea. °· Constipation. °· Irritability. °DIAGNOSIS °A hernia may be diagnosed with: °· A physical exam. During the exam your health care provider may ask you to cough or to make a specific movement, because a hernia is usually more visible when you move. °· Imaging tests. These can  include: °¨ X-rays. °¨ Ultrasound. °¨ CT scan. °TREATMENT °A hernia that is small and painless may not need to be treated. A hernia that is large or painful may be treated with surgery. Inguinal hernias may be treated with surgery to prevent incarceration or strangulation. Strangulated hernias are always treated with surgery, because lack of blood to the trapped organ or tissue can cause it to die. °Surgery to treat a hernia involves pushing the bulge back into place and repairing the weak part of the abdomen. °HOME CARE INSTRUCTIONS °· Avoid straining. °· Do not lift anything heavier than 10 lb (4.5 kg). °· Lift with your leg muscles, not your back muscles. This helps avoid strain. °· When coughing, try to cough gently. °· Prevent constipation. Constipation leads to straining with bowel movements, which can make a hernia worse or cause a hernia repair to break down. You can prevent constipation by: °· Eating a high-fiber diet that includes plenty of fruits and vegetables. °· Drinking enough fluids to keep your urine clear or pale yellow. Aim to drink 6-8 glasses of water per day. °· Using a stool softener as directed by your health care provider. °· Lose weight, if you are overweight. °· Do not use any tobacco products, including cigarettes, chewing tobacco, or electronic cigarettes. If you need help quitting, ask your health care provider. °· Keep all follow-up visits as directed by your health care provider. This is important. Your health care provider may need to monitor your condition. °SEEK MEDICAL CARE IF: °· You have   swelling, redness, and pain in the affected area.  Your bowel habits change. SEEK IMMEDIATE MEDICAL CARE IF:  You have a fever.  You have abdominal pain that is getting worse.  You feel nauseous or you vomit.  You cannot push the hernia back in place by gently pressing on it while you are lying down.  The hernia:  Changes in shape or size.  Is stuck outside the  abdomen.  Becomes discolored.  Feels hard or tender.   This information is not intended to replace advice given to you by your health care provider. Make sure you discuss any questions you have with your health care provider.   Document Released: 11/12/2005 Document Revised: 12/03/2014 Document Reviewed: 09/22/2014 Else[employer] Interactive Patient Education 2016 [employer]  Inguinal Hernia, Adult Muscles help keep everything in the body in its proper place. But if a weak spot in the muscles develops, something can poke through. That is called a hernia. When this happens in the lower part of the belly (abdomen), it is called an inguinal hernia. (It takes its name from a part of the body in this region called the inguinal canal.) A weak spot in the wall of muscles lets some fat or part of the small intestine bulge through. An inguinal hernia can develop at any age. Men get them more often than women. CAUSES  In adults, an inguinal hernia develops over time.  It can be triggered by:  Suddenly straining the muscles of the lower abdomen.  Lifting heavy objects.  Straining to have a bowel movement. Difficult bowel movements (constipation) can lead to this.  Constant coughing. This may be caused by smoking or lung disease.  Being overweight.  Being pregnant.  Working at a job that requires long periods of standing or heavy lifting.  Having had an inguinal hernia before. One type can be an emergency situation. It is called a strangulated inguinal hernia. It develops if part of the small intestine slips through the weak spot and cannot get back into the abdomen. The blood supply can be cut off. If that happens, part of the intestine may die. This situation requires emergency surgery. SYMPTOMS  Often, a small inguinal hernia has no symptoms. It is found when a healthcare provider does a physical exam. Larger hernias usually have symptoms.   In adults, symptoms may include:  A lump in the  groin. This is easier to see when the person is standing. It might disappear when lying down.  In men, a lump in the scrotum.  Pain or burning in the groin. This occurs especially when lifting, straining or coughing.  A dull ache or feeling of pressure in the groin.  Signs of a strangulated hernia can include:  A bulge in the groin that becomes very painful and tender to the touch.  A bulge that turns red or purple.  Fever, nausea and vomiting.  Inability to have a bowel movement or to pass gas. DIAGNOSIS  To decide if you have an inguinal hernia, a healthcare provider will probably do a physical examination.  This will include asking questions about any symptoms you have noticed.  The healthcare provider might feel the groin area and ask you to cough. If an inguinal hernia is felt, the healthcare provider may try to slide it back into the abdomen.  Usually no other tests are needed. TREATMENT  Treatments can vary. The size of the hernia makes a difference. Options include:  Watchful waiting. This is often suggested if  the hernia is small and you have had no symptoms.  No medical procedure will be done unless symptoms develop.  You will need to watch closely for symptoms. If any occur, contact your healthcare provider right away.  Surgery. This is used if the hernia is larger or you have symptoms.  Open surgery. This is usually an outpatient procedure (you will not stay overnight in a hospital). An cut (incision) is made through the skin in the groin. The hernia is put back inside the abdomen. The weak area in the muscles is then repaired by herniorrhaphy or hernioplasty. Herniorrhaphy: in this type of surgery, the weak muscles are sewn back together. Hernioplasty: a patch or mesh is used to close the weak area in the abdominal wall.  Laparoscopy. In this procedure, a surgeon makes small incisions. A thin tube with a tiny video camera (called a laparoscope) is put into the  abdomen. The surgeon repairs the hernia with mesh by looking with the video camera and using two long instruments. HOME CARE INSTRUCTIONS   After surgery to repair an inguinal hernia:  You will need to take pain medicine prescribed by your healthcare provider. Follow all directions carefully.  You will need to take care of the wound from the incision.  Your activity will be restricted for awhile. This will probably include no heavy lifting for several weeks. You also should not do anything too active for a few weeks. When you can return to work will depend on the type of job that you have.  During "watchful waiting" periods, you should:  Maintain a healthy weight.  Eat a diet high in fiber (fruits, vegetables and whole grains).  Drink plenty of fluids to avoid constipation. This means drinking enough water and other liquids to keep your urine clear or pale yellow.  Do not lift heavy objects.  Do not stand for long periods of time.  Quit smoking. This should keep you from developing a frequent cough. SEEK MEDICAL CARE IF:   A bulge develops in your groin area.  You feel pain, a burning sensation or pressure in the groin. This might be worse if you are lifting or straining.  You develop a fever of more than 100.5 F (38.1 C). SEEK IMMEDIATE MEDICAL CARE IF:   Pain in the groin increases suddenly.  A bulge in the groin gets bigger suddenly and does not go down.  For men, there is sudden pain in the scrotum. Or, the size of the scrotum increases.  A bulge in the groin area becomes red or purple and is painful to touch.  You have nausea or vomiting that does not go away.  You feel your heart beating much faster than normal.  You cannot have a bowel movement or pass gas.  You develop a fever of more than 102.0 F (38.9 C).   This information is not intended to replace advice given to you by your health care provider. Make sure you discuss any questions you have with your  health care provider.   Follow-up with General surgery outpatient for evaluation of hernia. See above for symptoms that indicate immediate return to the emergency department.

## 2015-09-16 NOTE — ED Notes (Signed)
Pt ambulatory to restroom

## 2015-09-16 NOTE — ED Notes (Addendum)
Reports right lower pelvic pain that started a couple of days ago. Also reports pain where she has a dx hernia. Increased urination. States she is currently on her period
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
## 2015-10-04 ENCOUNTER — Other Ambulatory Visit: Payer: Self-pay | Admitting: Surgery

## 2015-10-07 ENCOUNTER — Other Ambulatory Visit: Payer: Self-pay | Admitting: Surgery

## 2015-10-07 DIAGNOSIS — R1905 Periumbilic swelling, mass or lump: Secondary | ICD-10-CM

## 2015-10-17 ENCOUNTER — Other Ambulatory Visit: Payer: Self-pay | Admitting: Surgery

## 2015-10-17 ENCOUNTER — Ambulatory Visit
Admission: RE | Admit: 2015-10-17 | Discharge: 2015-10-17 | Disposition: A | Discharge status: Home / Self Care | Payer: Medicaid Other | Source: Ambulatory Visit | Attending: Surgery | Admitting: Surgery

## 2015-10-17 DIAGNOSIS — K439 Ventral hernia without obstruction or gangrene: Secondary | ICD-10-CM

## 2015-10-17 DIAGNOSIS — R1905 Periumbilic swelling, mass or lump: Secondary | ICD-10-CM

## 2015-10-18 ENCOUNTER — Other Ambulatory Visit: Payer: Self-pay

## 2015-10-24 ENCOUNTER — Encounter (HOSPITAL_COMMUNITY): Payer: Self-pay | Admitting: *Deleted

## 2015-10-24 ENCOUNTER — Emergency Department (HOSPITAL_COMMUNITY)
Admission: EM | Admit: 2015-10-24 | Discharge: 2015-10-24 | Disposition: A | Discharge status: Home / Self Care | Payer: Medicaid Other | Attending: Emergency Medicine | Admitting: Emergency Medicine

## 2015-10-24 DIAGNOSIS — S0083XA Contusion of other part of head, initial encounter: Secondary | ICD-10-CM | POA: Diagnosis not present

## 2015-10-24 DIAGNOSIS — F329 Major depressive disorder, single episode, unspecified: Secondary | ICD-10-CM | POA: Diagnosis not present

## 2015-10-24 DIAGNOSIS — J45909 Unspecified asthma, uncomplicated: Secondary | ICD-10-CM | POA: Insufficient documentation

## 2015-10-24 DIAGNOSIS — Y998 Other external cause status: Secondary | ICD-10-CM | POA: Insufficient documentation

## 2015-10-24 DIAGNOSIS — W228XXA Striking against or struck by other objects, initial encounter: Secondary | ICD-10-CM | POA: Insufficient documentation

## 2015-10-24 DIAGNOSIS — S060X0A Concussion without loss of consciousness, initial encounter: Secondary | ICD-10-CM | POA: Diagnosis not present

## 2015-10-24 DIAGNOSIS — S0990XA Unspecified injury of head, initial encounter: Secondary | ICD-10-CM | POA: Diagnosis present

## 2015-10-24 DIAGNOSIS — Z87891 Personal history of nicotine dependence: Secondary | ICD-10-CM | POA: Insufficient documentation

## 2015-10-24 DIAGNOSIS — Y9289 Other specified places as the place of occurrence of the external cause: Secondary | ICD-10-CM | POA: Insufficient documentation

## 2015-10-24 DIAGNOSIS — Y9389 Activity, other specified: Secondary | ICD-10-CM | POA: Insufficient documentation

## 2015-10-24 HISTORY — DX: Depression, unspecified: F32.A

## 2015-10-24 HISTORY — DX: Major depressive disorder, single episode, unspecified: F32.9

## 2015-10-24 MED ORDER — SODIUM CHLORIDE 0.9 % IV BOLUS (SEPSIS)
500.0000 mL | Freq: Once | INTRAVENOUS | Status: AC
  Administered 2015-10-24: 500 mL via INTRAVENOUS

## 2015-10-24 MED ORDER — OXYCODONE-ACETAMINOPHEN 5-325 MG PO TABS
ORAL_TABLET | ORAL | Status: AC
  Filled 2015-10-24: qty 1

## 2015-10-24 MED ORDER — HYDROCODONE-ACETAMINOPHEN 5-325 MG PO TABS: 2.0000 | ORAL_TABLET | ORAL | Status: DC | PRN

## 2015-10-24 MED ORDER — KETOROLAC TROMETHAMINE 30 MG/ML IJ SOLN
30.0000 mg | Freq: Once | INTRAMUSCULAR | Status: AC
  Administered 2015-10-24: 30 mg via INTRAVENOUS
  Filled 2015-10-24: qty 1

## 2015-10-24 MED ORDER — METOCLOPRAMIDE HCL 10 MG PO TABS: 10.0000 mg | ORAL_TABLET | Freq: Once | ORAL | Status: DC

## 2015-10-24 MED ORDER — DIPHENHYDRAMINE HCL 50 MG/ML IJ SOLN
25.0000 mg | Freq: Once | INTRAMUSCULAR | Status: AC
  Administered 2015-10-24: 25 mg via INTRAVENOUS
  Filled 2015-10-24: qty 1

## 2015-10-24 MED ORDER — OXYCODONE-ACETAMINOPHEN 5-325 MG PO TABS
1.0000 | ORAL_TABLET | Freq: Once | ORAL | Status: AC
  Administered 2015-10-24: 1 via ORAL

## 2015-10-24 MED ORDER — DIPHENHYDRAMINE HCL 25 MG PO CAPS: 25.0000 mg | ORAL_CAPSULE | Freq: Once | ORAL | Status: DC

## 2015-10-24 MED ORDER — METOCLOPRAMIDE HCL 5 MG/ML IJ SOLN
10.0000 mg | Freq: Once | INTRAMUSCULAR | Status: AC
  Administered 2015-10-24: 10 mg via INTRAVENOUS
  Filled 2015-10-24: qty 2

## 2015-10-24 NOTE — Discharge Instructions (Signed)
[name],  Nice meeting you! Return to the emergency department if your headache worsens, you become confused, increasingly dizzy, or if you become extremely tired. Feel better soon!  S. Lane HackerNicole Lezlie Ritchey, PA-C  Concussion, Adult A concussion, or closed-head injury, is a brain injury caused by a direct blow to the head or by a quick and sudden movement (jolt) of the head or neck. Concussions are usually not life-threatening. Even so, the effects of a concussion can be serious. If you have had a concussion before, you are more likely to experience concussion-like symptoms after a direct blow to the head.  CAUSES  Direct blow to the head, such as from running into another player during a soccer game, being hit in a fight, or hitting your head on a hard surface.  A jolt of the head or neck that causes the brain to move back and forth inside the skull, such as in a car crash. SIGNS AND SYMPTOMS The signs of a concussion can be hard to notice. Early on, they may be missed by you, family members, and health care providers. You may look fine but act or feel differently. Symptoms are usually temporary, but they may last for days, weeks, or even longer. Some symptoms may appear right away while others may not show up for hours or days. Every head injury is different. Symptoms include:  Mild to moderate headaches that will not go away.  A feeling of pressure inside your head.  Having more trouble than usual:  Learning or remembering things you have heard.  Answering questions.  Paying attention or concentrating.  Organizing daily tasks.  Making decisions and solving problems.  Slowness in thinking, acting or reacting, speaking, or reading.  Getting lost or being easily confused.  Feeling tired all the time or lacking energy (fatigued).  Feeling drowsy.  Sleep disturbances.  Sleeping more than usual.  Sleeping less than usual.  Trouble falling asleep.  Trouble sleeping  (insomnia).  Loss of balance or feeling lightheaded or dizzy.  Nausea or vomiting.  Numbness or tingling.  Increased sensitivity to:  Sounds.  Lights.  Distractions.  Vision problems or eyes that tire easily.  Diminished sense of taste or smell.  Ringing in the ears.  Mood changes such as feeling sad or anxious.  Becoming easily irritated or angry for little or no reason.  Lack of motivation.  Seeing or hearing things other people do not see or hear (hallucinations). DIAGNOSIS Your health care provider can usually diagnose a concussion based on a description of your injury and symptoms. He or she will ask whether you passed out (lost consciousness) and whether you are having trouble remembering events that happened right before and during your injury. Your evaluation might include:  A brain scan to look for signs of injury to the brain. Even if the test shows no injury, you may still have a concussion.  Blood tests to be sure other problems are not present. TREATMENT  Concussions are usually treated in an emergency department, in urgent care, or at a clinic. You may need to stay in the hospital overnight for further treatment.  Tell your health care provider if you are taking any medicines, including prescription medicines, over-the-counter medicines, and natural remedies. Some medicines, such as blood thinners (anticoagulants) and aspirin, may increase the chance of complications. Also tell your health care provider whether you have had alcohol or are taking illegal drugs. This information may affect treatment.  Your health care provider will send  you home with important instructions to follow.  How fast you will recover from a concussion depends on many factors. These factors include how severe your concussion is, what part of your brain was injured, your age, and how healthy you were before the concussion.  Most people with mild injuries recover fully. Recovery can  take time. In general, recovery is slower in older persons. Also, persons who have had a concussion in the past or have other medical problems may find that it takes longer to recover from their current injury. HOME CARE INSTRUCTIONS General Instructions  Carefully follow the directions your health care provider gave you.  Only take over-the-counter or prescription medicines for pain, discomfort, or fever as directed by your health care provider.  Take only those medicines that your health care provider has approved.  Do not drink alcohol until your health care provider says you are well enough to do so. Alcohol and certain other drugs may slow your recovery and can put you at risk of further injury.  If it is harder than usual to remember things, write them down.  If you are easily distracted, try to do one thing at a time. For example, do not try to watch TV while fixing dinner.  Talk with family members or close friends when making important decisions.  Keep all follow-up appointments. Repeated evaluation of your symptoms is recommended for your recovery.  Watch your symptoms and tell others to do the same. Complications sometimes occur after a concussion. Older adults with a brain injury may have a higher risk of serious complications, such as a blood clot on the brain.  Tell your teachers, school nurse, school counselor, coach, athletic trainer, or work Production designer, theatre/television/film about your injury, symptoms, and restrictions. Tell them about what you can or cannot do. They should watch for:  Increased problems with attention or concentration.  Increased difficulty remembering or learning new information.  Increased time needed to complete tasks or assignments.  Increased irritability or decreased ability to cope with stress.  Increased symptoms.  Rest. Rest helps the brain to heal. Make sure you:  Get plenty of sleep at night. Avoid staying up late at night.  Keep the same bedtime hours on  weekends and weekdays.  Rest during the day. Take daytime naps or rest breaks when you feel tired.  Limit activities that require a lot of thought or concentration. These include:  Doing homework or job-related work.  Watching TV.  Working on the computer.  Avoid any situation where there is potential for another head injury (football, hockey, soccer, basketball, martial arts, downhill snow sports and horseback riding). Your condition will get worse every time you experience a concussion. You should avoid these activities until you are evaluated by the appropriate follow-up health care providers. Returning To Your Regular Activities You will need to return to your normal activities slowly, not all at once. You must give your body and brain enough time for recovery.  Do not return to sports or other athletic activities until your health care provider tells you it is safe to do so.  Ask your health care provider when you can drive, ride a bicycle, or operate heavy machinery. Your ability to react may be slower after a brain injury. Never do these activities if you are dizzy.  Ask your health care provider about when you can return to work or school. Preventing Another Concussion It is very important to avoid another brain injury, especially before you have recovered. In  rare cases, another injury can lead to permanent brain damage, brain swelling, or death. The risk of this is greatest during the first 7-10 days after a head injury. Avoid injuries by:  Wearing a seat belt when riding in a car.  Drinking alcohol only in moderation.  Wearing a helmet when biking, skiing, skateboarding, skating, or doing similar activities.  Avoiding activities that could lead to a second concussion, such as contact or recreational sports, until your health care provider says it is okay.  Taking safety measures in your home.  Remove clutter and tripping hazards from floors and stairways.  Use grab bars  in bathrooms and handrails by stairs.  Place non-slip mats on floors and in bathtubs.  Improve lighting in dim areas. SEEK MEDICAL CARE IF:  You have increased problems paying attention or concentrating.  You have increased difficulty remembering or learning new information.  You need more time to complete tasks or assignments than before.  You have increased irritability or decreased ability to cope with stress.  You have more symptoms than before. Seek medical care if you have any of the following symptoms for more than 2 weeks after your injury:  Lasting (chronic) headaches.  Dizziness or balance problems.  Nausea.  Vision problems.  Increased sensitivity to noise or light.  Depression or mood swings.  Anxiety or irritability.  Memory problems.  Difficulty concentrating or paying attention.  Sleep problems.  Feeling tired all the time. SEEK IMMEDIATE MEDICAL CARE IF:  You have severe or worsening headaches. These may be a sign of a blood clot in the brain.  You have weakness (even if only in one hand, leg, or part of the face).  You have numbness.  You have decreased coordination.  You vomit repeatedly.  You have increased sleepiness.  One pupil is larger than the other.  You have convulsions.  You have slurred speech.  You have increased confusion. This may be a sign of a blood clot in the brain.  You have increased restlessness, agitation, or irritability.  You are unable to recognize people or places.  You have neck pain.  It is difficult to wake you up.  You have unusual behavior changes.  You lose consciousness. MAKE SURE YOU:  Understand these instructions.  Will watch your condition.  Will get help right away if you are not doing well or get worse.   This information is not intended to replace advice given to you by your health care provider. Make sure you discuss any questions you have with your health care provider.     Document Released: 02/02/2004 Document Revised: 12/03/2014 Document Reviewed: 06/04/2013 Elsevier Interactive Patient Education [employer]

## 2015-10-24 NOTE — ED Notes (Signed)
PA at bedside.
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
## 2015-12-24 ENCOUNTER — Emergency Department (HOSPITAL_COMMUNITY)
Admission: EM | Admit: 2015-12-24 | Discharge: 2015-12-24 | Disposition: A | Discharge status: Home / Self Care | Payer: Medicaid Other | Attending: Emergency Medicine | Admitting: Emergency Medicine

## 2015-12-24 ENCOUNTER — Encounter (HOSPITAL_COMMUNITY): Payer: Self-pay | Admitting: Emergency Medicine

## 2015-12-24 ENCOUNTER — Emergency Department (HOSPITAL_COMMUNITY): Payer: Medicaid Other

## 2015-12-24 DIAGNOSIS — F329 Major depressive disorder, single episode, unspecified: Secondary | ICD-10-CM | POA: Insufficient documentation

## 2015-12-24 DIAGNOSIS — J45909 Unspecified asthma, uncomplicated: Secondary | ICD-10-CM | POA: Diagnosis not present

## 2015-12-24 DIAGNOSIS — Y9289 Other specified places as the place of occurrence of the external cause: Secondary | ICD-10-CM | POA: Insufficient documentation

## 2015-12-24 DIAGNOSIS — M79641 Pain in right hand: Secondary | ICD-10-CM

## 2015-12-24 DIAGNOSIS — Z79899 Other long term (current) drug therapy: Secondary | ICD-10-CM | POA: Diagnosis not present

## 2015-12-24 DIAGNOSIS — Z87891 Personal history of nicotine dependence: Secondary | ICD-10-CM | POA: Insufficient documentation

## 2015-12-24 DIAGNOSIS — S6991XA Unspecified injury of right wrist, hand and finger(s), initial encounter: Secondary | ICD-10-CM | POA: Diagnosis present

## 2015-12-24 DIAGNOSIS — Y9389 Activity, other specified: Secondary | ICD-10-CM | POA: Diagnosis not present

## 2015-12-24 DIAGNOSIS — Y998 Other external cause status: Secondary | ICD-10-CM | POA: Insufficient documentation

## 2015-12-24 MED ORDER — HYDROCODONE-ACETAMINOPHEN 5-325 MG PO TABS: 2.0000 | ORAL_TABLET | Freq: Once | ORAL | Status: DC

## 2015-12-24 MED ORDER — HYDROCODONE-ACETAMINOPHEN 5-325 MG PO TABS
1.0000 | ORAL_TABLET | Freq: Once | ORAL | Status: AC
  Administered 2015-12-24: 1 via ORAL
  Filled 2015-12-24: qty 1

## 2015-12-24 MED ORDER — ONDANSETRON HCL 4 MG PO TABS: 4.0000 mg | ORAL_TABLET | Freq: Four times a day (QID) | ORAL | Status: DC

## 2015-12-24 MED ORDER — OXYCODONE-ACETAMINOPHEN 5-325 MG PO TABS: 1.0000 | ORAL_TABLET | Freq: Four times a day (QID) | ORAL | Status: DC | PRN

## 2015-12-24 MED ORDER — ONDANSETRON 4 MG PO TBDP
4.0000 mg | ORAL_TABLET | Freq: Once | ORAL | Status: AC
  Administered 2015-12-24: 4 mg via ORAL
  Filled 2015-12-24: qty 1

## 2015-12-24 MED ORDER — IBUPROFEN 800 MG PO TABS: 800.0000 mg | ORAL_TABLET | Freq: Three times a day (TID) | ORAL | Status: DC
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
## 2015-12-24 NOTE — Discharge Instructions (Signed)
Call Monday morning to make an appointment with the orthopedic clinic listed above.  Take ibuprofen as directed. Take pain medication as needed - This can make you very drowsy - please do not drink or drive on this medication Return to ER for new or worsening symptoms, any additional concerns.
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
## 2015-12-29 DIAGNOSIS — M79641 Pain in right hand: Secondary | ICD-10-CM | POA: Insufficient documentation

## 2015-12-29 DIAGNOSIS — S60221A Contusion of right hand, initial encounter: Secondary | ICD-10-CM | POA: Insufficient documentation

## 2015-12-29 DIAGNOSIS — S6000XA Contusion of unspecified finger without damage to nail, initial encounter: Secondary | ICD-10-CM | POA: Insufficient documentation

## 2016-02-12 ENCOUNTER — Encounter (HOSPITAL_COMMUNITY): Payer: Self-pay | Admitting: *Deleted

## 2016-02-12 ENCOUNTER — Emergency Department (HOSPITAL_COMMUNITY): Payer: Medicaid Other

## 2016-02-12 ENCOUNTER — Emergency Department (HOSPITAL_COMMUNITY)
Admission: EM | Admit: 2016-02-12 | Discharge: 2016-02-12 | Disposition: A | Discharge status: Home / Self Care | Payer: Medicaid Other | Attending: Physician Assistant | Admitting: Physician Assistant

## 2016-02-12 DIAGNOSIS — Z87828 Personal history of other (healed) physical injury and trauma: Secondary | ICD-10-CM | POA: Insufficient documentation

## 2016-02-12 DIAGNOSIS — Z79899 Other long term (current) drug therapy: Secondary | ICD-10-CM | POA: Diagnosis not present

## 2016-02-12 DIAGNOSIS — Z87891 Personal history of nicotine dependence: Secondary | ICD-10-CM | POA: Diagnosis not present

## 2016-02-12 DIAGNOSIS — H9209 Otalgia, unspecified ear: Secondary | ICD-10-CM | POA: Diagnosis not present

## 2016-02-12 DIAGNOSIS — J45909 Unspecified asthma, uncomplicated: Secondary | ICD-10-CM | POA: Diagnosis not present

## 2016-02-12 DIAGNOSIS — R509 Fever, unspecified: Secondary | ICD-10-CM | POA: Diagnosis present

## 2016-02-12 DIAGNOSIS — Z791 Long term (current) use of non-steroidal anti-inflammatories (NSAID): Secondary | ICD-10-CM | POA: Insufficient documentation

## 2016-02-12 DIAGNOSIS — J111 Influenza due to unidentified influenza virus with other respiratory manifestations: Secondary | ICD-10-CM | POA: Diagnosis not present

## 2016-02-12 DIAGNOSIS — R000 Tachycardia, unspecified: Secondary | ICD-10-CM | POA: Insufficient documentation

## 2016-02-12 DIAGNOSIS — F329 Major depressive disorder, single episode, unspecified: Secondary | ICD-10-CM | POA: Insufficient documentation

## 2016-02-12 LAB — RAPID STREP SCREEN (MED CTR MEBANE ONLY): Streptococcus, Group A Screen (Direct): NEGATIVE

## 2016-02-12 MED ORDER — OSELTAMIVIR PHOSPHATE 75 MG PO CAPS: 75.0000 mg | ORAL_CAPSULE | Freq: Two times a day (BID) | ORAL | Status: DC

## 2016-02-12 MED ORDER — HYDROCOD POLST-CPM POLST ER 10-8 MG/5ML PO SUER
5.0000 mL | Freq: Once | ORAL | Status: AC
  Administered 2016-02-12: 5 mL via ORAL
  Filled 2016-02-12: qty 5

## 2016-02-12 MED ORDER — IPRATROPIUM-ALBUTEROL 0.5-2.5 (3) MG/3ML IN SOLN
3.0000 mL | Freq: Once | RESPIRATORY_TRACT | Status: AC
  Administered 2016-02-12: 3 mL via RESPIRATORY_TRACT
  Filled 2016-02-12: qty 3

## 2016-02-12 MED ORDER — HYDROCOD POLST-CPM POLST ER 10-8 MG/5ML PO SUER: 5.0000 mL | Freq: Two times a day (BID) | ORAL | Status: DC | PRN

## 2016-02-12 MED ORDER — IBUPROFEN 400 MG PO TABS
600.0000 mg | ORAL_TABLET | Freq: Once | ORAL | Status: AC
  Administered 2016-02-12: 600 mg via ORAL
  Filled 2016-02-12: qty 1

## 2016-02-12 MED ORDER — ACETAMINOPHEN 500 MG PO TABS
1000.0000 mg | ORAL_TABLET | Freq: Once | ORAL | Status: AC
  Administered 2016-02-12: 1000 mg via ORAL
  Filled 2016-02-12: qty 2

## 2016-02-12 NOTE — Discharge Instructions (Signed)
Take tylenol and ibuprofen for fever and pain. Do not take the cough medication if driving as it will make you sleepy. Return as needed for worsening symptoms.
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
## 2016-02-12 NOTE — ED Notes (Signed)
Pt reports SX's started today . Pt reports fever and sore throat.

## 2016-02-15 LAB — CULTURE, GROUP A STREP (THRC)

## 2016-09-12 ENCOUNTER — Encounter (HOSPITAL_COMMUNITY): Payer: Self-pay | Admitting: Emergency Medicine

## 2016-09-12 DIAGNOSIS — K0889 Other specified disorders of teeth and supporting structures: Secondary | ICD-10-CM | POA: Diagnosis not present

## 2016-09-12 DIAGNOSIS — J45909 Unspecified asthma, uncomplicated: Secondary | ICD-10-CM | POA: Diagnosis not present

## 2016-09-12 DIAGNOSIS — Z91010 Allergy to peanuts: Secondary | ICD-10-CM | POA: Diagnosis not present

## 2016-09-12 DIAGNOSIS — R1011 Right upper quadrant pain: Secondary | ICD-10-CM | POA: Diagnosis present

## 2016-09-12 DIAGNOSIS — Z87891 Personal history of nicotine dependence: Secondary | ICD-10-CM | POA: Insufficient documentation

## 2016-09-12 NOTE — ED Triage Notes (Signed)
Pt presents to ED for assessment of abdominal pain related to a previously diagnosed hernia in the right lower abdomen.  A tender mass is noted to the right lower quadrant.  Pt also c/o swelling and pain to the left wisdom tooth and lower jaw.  Pt sts she was using home tooth remedy with relief until today.  Denies trouble swallowing or breathing, but states difficulty with chewing.

## 2016-09-13 ENCOUNTER — Emergency Department (HOSPITAL_COMMUNITY)
Admission: EM | Admit: 2016-09-13 | Discharge: 2016-09-13 | Disposition: A | Discharge status: Home / Self Care | Payer: Medicaid Other | Attending: Emergency Medicine | Admitting: Emergency Medicine

## 2016-09-13 DIAGNOSIS — R1084 Generalized abdominal pain: Secondary | ICD-10-CM

## 2016-09-13 DIAGNOSIS — K0889 Other specified disorders of teeth and supporting structures: Secondary | ICD-10-CM

## 2016-09-13 LAB — CBC
HCT: 36.6 % (ref 36.0–46.0)
HEMOGLOBIN: 11.7 g/dL — AB (ref 12.0–15.0)
MCH: 25.2 pg — ABNORMAL LOW (ref 26.0–34.0)
MCHC: 32 g/dL (ref 30.0–36.0)
MCV: 78.7 fL (ref 78.0–100.0)
Platelets: 290 10*3/uL (ref 150–400)
RBC: 4.65 MIL/uL (ref 3.87–5.11)
RDW: 14.8 % (ref 11.5–15.5)
WBC: 7 10*3/uL (ref 4.0–10.5)

## 2016-09-13 LAB — COMPREHENSIVE METABOLIC PANEL
ALK PHOS: 58 U/L (ref 38–126)
ALT: 16 U/L (ref 14–54)
ANION GAP: 5 (ref 5–15)
AST: 22 U/L (ref 15–41)
Albumin: 3.9 g/dL (ref 3.5–5.0)
BUN: 12 mg/dL (ref 6–20)
CALCIUM: 9.5 mg/dL (ref 8.9–10.3)
CO2: 27 mmol/L (ref 22–32)
Chloride: 106 mmol/L (ref 101–111)
Creatinine, Ser: 0.77 mg/dL (ref 0.44–1.00)
GFR calc Af Amer: >60 mL/min (ref >60)
GFR calc non Af Amer: >60 mL/min (ref >60)
Glucose, Bld: 92 mg/dL (ref 65–99)
POTASSIUM: 3.5 mmol/L (ref 3.5–5.1)
SODIUM: 138 mmol/L (ref 135–145)
TOTAL PROTEIN: 7.4 g/dL (ref 6.5–8.1)
Total Bilirubin: 0.3 mg/dL (ref 0.3–1.2)

## 2016-09-13 LAB — URINE MICROSCOPIC-ADD ON

## 2016-09-13 LAB — URINALYSIS, ROUTINE W REFLEX MICROSCOPIC
Bilirubin Urine: NEGATIVE
Glucose, UA: NEGATIVE mg/dL
Hgb urine dipstick: NEGATIVE
Ketones, ur: NEGATIVE mg/dL
NITRITE: NEGATIVE
Protein, ur: NEGATIVE mg/dL
SPECIFIC GRAVITY, URINE: 1.018 (ref 1.005–1.030)
pH: 6.5 (ref 5.0–8.0)

## 2016-09-13 LAB — I-STAT BETA HCG BLOOD, ED (MC, WL, AP ONLY)

## 2016-09-13 LAB — LIPASE, BLOOD: Lipase: 21 U/L (ref 11–51)

## 2016-09-13 MED ORDER — HYDROCODONE-ACETAMINOPHEN 5-325 MG PO TABS
1.0000 | ORAL_TABLET | Freq: Once | ORAL | Status: AC
  Administered 2016-09-13: 1 via ORAL
  Filled 2016-09-13: qty 1

## 2016-09-13 MED ORDER — HYDROCODONE-ACETAMINOPHEN 5-325 MG PO TABS: 1.0000 | ORAL_TABLET | ORAL | 0 refills | Status: DC | PRN

## 2016-09-13 MED ORDER — PENICILLIN V POTASSIUM 500 MG PO TABS: 500.0000 mg | ORAL_TABLET | Freq: Three times a day (TID) | ORAL | 0 refills | Status: AC

## 2016-09-13 NOTE — Discharge Instructions (Signed)
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
## 2016-09-13 NOTE — ED Notes (Signed)
Dr. Wickline at bedside at this time.  

## 2016-11-01 ENCOUNTER — Other Ambulatory Visit: Payer: Self-pay | Admitting: Surgery

## 2016-11-01 DIAGNOSIS — R1905 Periumbilic swelling, mass or lump: Secondary | ICD-10-CM

## 2016-11-02 ENCOUNTER — Other Ambulatory Visit: Payer: Self-pay | Admitting: Surgery

## 2016-11-02 DIAGNOSIS — R1905 Periumbilic swelling, mass or lump: Secondary | ICD-10-CM

## 2016-11-06 ENCOUNTER — Ambulatory Visit
Admission: RE | Admit: 2016-11-06 | Discharge: 2016-11-06 | Disposition: A | Discharge status: Home / Self Care | Payer: Medicaid Other | Source: Ambulatory Visit | Attending: Surgery | Admitting: Surgery

## 2016-11-06 VITALS — BP 112/71 | HR 86 | Resp 12

## 2016-11-06 DIAGNOSIS — R1905 Periumbilic swelling, mass or lump: Secondary | ICD-10-CM

## 2016-11-06 DIAGNOSIS — T508X5A Adverse effect of diagnostic agents, initial encounter: Secondary | ICD-10-CM

## 2016-11-06 MED ORDER — DIPHENHYDRAMINE HCL 50 MG/ML IJ SOLN
50.0000 mg | Freq: Once | INTRAMUSCULAR | Status: AC
  Administered 2016-11-06: 50 mg via INTRAVENOUS

## 2016-11-06 MED ORDER — IOPAMIDOL (ISOVUE-300) INJECTION 61%
100.0000 mL | Freq: Once | INTRAVENOUS | Status: DC | PRN
Start: 2016-11-06 — End: 2016-11-07

## 2016-11-06 NOTE — Discharge Instructions (Signed)
Hives Hives (urticaria) are itchy, red, swollen areas on your skin. Hives can appear on any part of your body and can vary in size. They can be as small as the tip of a pen or much larger. Hives often fade within 24 hours (acute hives). In other cases, new hives appear after old ones fade. This cycle can continue for several days or weeks (chronic hives). Hives result from your body's reaction to an irritant or to something that you are allergic to (trigger). When you are exposed to a trigger, your body releases a chemical (histamine) that causes redness, itching, and swelling. You can get hives immediately after being exposed to a trigger or hours later. Hives do not spread from person to person (are not contagious). Your hives may get worse with scratching, exercise, and emotional stress. What are the causes? Causes of this condition include:  Allergies to certain foods or ingredients.  Insect bites or stings.  Exposure to pollen or pet dander.  Contact with latex or chemicals.  Spending time in sunlight, heat, or cold (exposure).  Exercise.  Stress. You can also get hives from some medical conditions and treatments. These include:  Viruses, including the common cold.  Bacterial infections, such as urinary tract infections and strep throat.  Disorders such as vasculitis, lupus, or thyroid disease.  Certain medications.  Allergy shots.  Blood transfusions. Sometimes, the cause of hives is not known (idiopathic hives). What increases the risk? This condition is more likely to develop in:  Women.  People who have food allergies, especially to citrus fruits, milk, eggs, peanuts, tree nuts, or shellfish.  People who are allergic to:  Medicines.  Latex.  Insects.  Animals.  Pollen.  People who have certain medical conditions, includinglupus or thyroid disease. What are the signs or symptoms? The main symptom of this condition is raised, itchyred or white bumps or  patches on your skin. These areas may:  Become large and swollen (welts).  Change in shape and location, quickly and repeatedly.  Be separate hives or connect over a large area of skin.  Sting or become painful.  Turn white when pressed in the center (blanch). In severe cases, yourhands, feet, and face may also become swollen. This may occur if hives develop deeper in your skin. How is this diagnosed? This condition is diagnosed based on your symptoms, medical history, and physical exam. Your skin, urine, or blood may be tested to find out what is causing your hives and to rule out other health issues. Your health care provider may also remove a small sample of skin from the affected area and examine it under a microscope (biopsy). How is this treated? Treatment depends on the severity of your condition. Your health care provider may recommend using cool, wet cloths (cool compresses) or taking cool showers to relieve itching. Hives are sometimes treated with medicines, including:  Antihistamines.  Corticosteroids.  Antibiotics.  An injectable medicine (omalizumab). Your health care provider may prescribe this if you have chronic idiopathic hives and you continue to have symptoms even after treatment with antihistamines. Severe cases may require an emergency injection of adrenaline (epinephrine) to prevent a life-threatening allergic reaction (anaphylaxis). Follow these instructions at home: Medicines  Take or apply over-the-counter and prescription medicines only as told by your health care provider.  If you were prescribed an antibiotic medicine, use it as told by your health care provider. Do not stop taking the antibiotic even if you start to feel better. Skin Care    Apply cool compresses to the affected areas.  Do not scratch or rub your skin. General instructions  Do not take hot showers or baths. This can make itching worse.  Do not wear tight-fitting clothing.  Use  sunscreen and wear protective clothing when you are outside.  Avoid any substances that cause your hives. Keep a journal to help you track what causes your hives. Write down:  What medicines you take.  What you eat and drink.  What products you use on your skin.  Keep all follow-up visits as told by your health care provider. This is important. Contact a health care provider if:  Your symptoms are not controlled with medicine.  Your joints are painful or swollen. Get help right away if:  You have a fever.  You have pain in your abdomen.  Your tongue or lips are swollen.  Your eyelids are swollen.  Your chest or throat feels tight.  You have trouble breathing or swallowing. These symptoms may represent a serious problem that is an emergency. Do not wait to see if the symptoms will go away. Get medical help right away. Call your local emergency services (911 in the [place] Do not drive yourself to the hospital.  This information is not intended to replace advice given to you by your health care provider. Make sure you discuss any questions you have with your health care provider. Document Released: 11/12/2005 Document Revised: 04/11/2016 Document Reviewed: 08/31/2015 Elsevier Interactive Patient Education  2017 [employer] Inc.  

## 2016-12-07 ENCOUNTER — Encounter (HOSPITAL_COMMUNITY): Payer: Self-pay | Admitting: Emergency Medicine

## 2016-12-07 DIAGNOSIS — Z87891 Personal history of nicotine dependence: Secondary | ICD-10-CM | POA: Insufficient documentation

## 2016-12-07 DIAGNOSIS — J45909 Unspecified asthma, uncomplicated: Secondary | ICD-10-CM | POA: Diagnosis not present

## 2016-12-07 DIAGNOSIS — R109 Unspecified abdominal pain: Secondary | ICD-10-CM | POA: Insufficient documentation

## 2016-12-07 DIAGNOSIS — Z91010 Allergy to peanuts: Secondary | ICD-10-CM | POA: Diagnosis not present

## 2016-12-07 LAB — URINALYSIS, ROUTINE W REFLEX MICROSCOPIC
BILIRUBIN URINE: NEGATIVE
Glucose, UA: NEGATIVE mg/dL
Hgb urine dipstick: NEGATIVE
KETONES UR: 5 mg/dL — AB
Leukocytes, UA: NEGATIVE
NITRITE: NEGATIVE
PH: 6 (ref 5.0–8.0)
PROTEIN: NEGATIVE mg/dL
Specific Gravity, Urine: 1.019 (ref 1.005–1.030)

## 2016-12-07 LAB — CBC
HEMATOCRIT: 38 % (ref 36.0–46.0)
Hemoglobin: 12.3 g/dL (ref 12.0–15.0)
MCH: 26.1 pg (ref 26.0–34.0)
MCHC: 32.4 g/dL (ref 30.0–36.0)
MCV: 80.7 fL (ref 78.0–100.0)
PLATELETS: 277 10*3/uL (ref 150–400)
RBC: 4.71 MIL/uL (ref 3.87–5.11)
RDW: 14.6 % (ref 11.5–15.5)
WBC: 6.2 10*3/uL (ref 4.0–10.5)

## 2016-12-07 LAB — COMPREHENSIVE METABOLIC PANEL
ALBUMIN: 4.3 g/dL (ref 3.5–5.0)
ALK PHOS: 50 U/L (ref 38–126)
ALT: 15 U/L (ref 14–54)
AST: 19 U/L (ref 15–41)
Anion gap: 9 (ref 5–15)
BILIRUBIN TOTAL: 0.6 mg/dL (ref 0.3–1.2)
BUN: 9 mg/dL (ref 6–20)
CALCIUM: 9.6 mg/dL (ref 8.9–10.3)
CO2: 26 mmol/L (ref 22–32)
CREATININE: 0.73 mg/dL (ref 0.44–1.00)
Chloride: 102 mmol/L (ref 101–111)
GFR calc Af Amer: >60 mL/min (ref >60)
GLUCOSE: 89 mg/dL (ref 65–99)
Potassium: 3.3 mmol/L — ABNORMAL LOW (ref 3.5–5.1)
Sodium: 137 mmol/L (ref 135–145)
TOTAL PROTEIN: 7.4 g/dL (ref 6.5–8.1)

## 2016-12-07 LAB — LIPASE, BLOOD: Lipase: 12 U/L (ref 11–51)

## 2016-12-07 LAB — POC URINE PREG, ED: Preg Test, Ur: NEGATIVE

## 2016-12-07 NOTE — ED Triage Notes (Signed)
Patient here with abdominal pain.  Patient states she was told she had a hernia, but then told it was not.  She has a lump on the lower right abdomen.  She has nausea but no vomiting at this time.  Patient states she has had this problem for over 2 years.

## 2016-12-08 ENCOUNTER — Emergency Department (HOSPITAL_COMMUNITY)
Admission: EM | Admit: 2016-12-08 | Discharge: 2016-12-08 | Disposition: A | Discharge status: Home / Self Care | Payer: Medicaid Other | Attending: Emergency Medicine | Admitting: Emergency Medicine

## 2016-12-08 ENCOUNTER — Encounter (HOSPITAL_COMMUNITY): Payer: Self-pay | Admitting: Emergency Medicine

## 2016-12-08 DIAGNOSIS — R109 Unspecified abdominal pain: Secondary | ICD-10-CM

## 2016-12-08 MED ORDER — IBUPROFEN 800 MG PO TABS
800.0000 mg | ORAL_TABLET | Freq: Once | ORAL | Status: AC
  Administered 2016-12-08: 800 mg via ORAL
  Filled 2016-12-08: qty 1

## 2016-12-08 MED ORDER — CYCLOBENZAPRINE HCL 5 MG PO TABS: 5.0000 mg | ORAL_TABLET | Freq: Two times a day (BID) | ORAL | 0 refills | Status: DC | PRN

## 2016-12-08 MED ORDER — IBUPROFEN 400 MG PO TABS: 400.0000 mg | ORAL_TABLET | Freq: Four times a day (QID) | ORAL | 0 refills | Status: DC | PRN

## 2016-12-08 NOTE — ED Notes (Signed)
ED Provider at bedside. 

## 2016-12-08 NOTE — Discharge Instructions (Signed)
Please follow up with Dr. Magnus IvanBlackman as previously scheduled for further evaluation of your abdominal pain.
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
## 2016-12-18 ENCOUNTER — Other Ambulatory Visit: Payer: Self-pay | Admitting: Surgery

## 2016-12-21 NOTE — Progress Notes (Addendum)
POC URINE PREG, UA, CBC, CMET, LIPASE 12-07-16 EPIC CHEST XRAY 02-12-16 EPIC

## 2016-12-21 NOTE — Patient Instructions (Addendum)
[name] [name]  12/21/2016   Your procedure is scheduled on: 12-26-16  [facility]  [facility] 3rd floor to  Short Stay Center at 1245PM   Call this number if you have problems the morning of surgery [phone]   Remember: ONLY 1 PERSON MAY GO WITH YOU TO SHORT STAY TO GET  READY MORNING OF YOUR SURGERY.  Do not eat food After Midnight on 12-25-16. YOU MAY HAVE CLEAR LIQUIDS DAY OF SURGERY UNTIL 8:45 AM . NOTHING BY MOUTH AFTER  8:45 Am     Take these medicines the morning of surgery with A SIP OF WATER: NONE              You may not have any metal on your body including hair pins and              piercings  Do not wear jewelry, make-up, lotions, powders or perfumes, deodorant             Do not wear nail polish.  Do not shave  48 hours prior to surgery.              Men may shave face and neck.   Do not bring valuables to the hospital. [facility] IS NOT             RESPONSIBLE   FOR VALUABLES.  Contacts, dentures or bridgework may not be worn into surgery.  Leave suitcase in the car. After surgery it may be brought to your room.                Please read over the following fact sheets you were given: _____________________________________________________________________                CLEAR LIQUID DIET   Foods Allowed                                                                     Foods Excluded  Coffee and tea, regular and decaf                             liquids that you cannot  Plain Jell-O in any flavor                                             see through such as: Fruit ices (not with fruit pulp)                                     milk, soups, orange juice  Iced Popsicles                                    All solid food Carbonated beverages, regular and diet  Cranberry, grape and apple juices Sports drinks like Gatorade Lightly seasoned clear broth or  consume(fat free) Sugar, honey syrup  Sample Menu Breakfast                                Lunch                                     Supper Cranberry juice                    Beef broth                            Chicken broth Jell-O                                     Grape juice                           Apple juice Coffee or tea                        Jell-O                                      Popsicle                                                Coffee or tea                        Coffee or tea  _____________________________________________________________________  [facility] - Preparing for Surgery Before surgery, you can play an important role.  Because skin is not sterile, your skin needs to be as free of germs as possible.  You can reduce the number of germs on your skin by washing with CHG (chlorahexidine gluconate) soap before surgery.  CHG is an antiseptic cleaner which kills germs and bonds with the skin to continue killing germs even after washing. Please DO NOT use if you have an allergy to CHG or antibacterial soaps.  If your skin becomes reddened/irritated stop using the CHG and inform your nurse when you arrive at Short Stay. Do not shave (including legs and underarms) for at least 48 hours prior to the first CHG shower.  You may shave your face/neck. Please follow these instructions carefully:  1.  Shower with CHG Soap the night before surgery and the  morning of Surgery.  2.  If you choose to wash your hair, wash your hair first as usual with your  normal  shampoo.  3.  After you shampoo, rinse your hair and body thoroughly to remove the  shampoo.                           4.  Use CHG as you would any other liquid soap.  You can apply chg directly  to the skin and wash  Gently with a scrungie or clean washcloth.  5.  Apply the CHG Soap to your body ONLY FROM THE NECK DOWN.   Do not use on face/ open                           Wound or open sores.  Avoid contact with eyes, ears mouth and genitals (private parts).                       Wash face,  Genitals (private parts) with your normal soap.             6.  Wash thoroughly, paying special attention to the area where your surgery  will be performed.  7.  Thoroughly rinse your body with warm water from the neck down.  8.  DO NOT shower/wash with your normal soap after using and rinsing off  the CHG Soap.                9.  Pat yourself dry with a clean towel.            10.  Wear clean pajamas.            11.  Place clean sheets on your bed the night of your first shower and do not  sleep with pets. Day of Surgery : Do not apply any lotions/deodorants the morning of surgery.  Please wear clean clothes to the hospital/surgery center.  FAILURE TO FOLLOW THESE INSTRUCTIONS MAY RESULT IN THE CANCELLATION OF YOUR SURGERY PATIENT SIGNATURE_________________________________  NURSE SIGNATURE__________________________________  ________________________________________________________________________

## 2016-12-25 ENCOUNTER — Encounter (HOSPITAL_COMMUNITY): Payer: Self-pay

## 2016-12-25 ENCOUNTER — Encounter (HOSPITAL_COMMUNITY)
Admission: RE | Admit: 2016-12-25 | Discharge: 2016-12-25 | Disposition: A | Discharge status: Home / Self Care | Payer: Medicaid Other | Source: Ambulatory Visit | Attending: Surgery | Admitting: Surgery

## 2016-12-25 DIAGNOSIS — Z01818 Encounter for other preprocedural examination: Secondary | ICD-10-CM | POA: Diagnosis present

## 2016-12-25 DIAGNOSIS — K429 Umbilical hernia without obstruction or gangrene: Secondary | ICD-10-CM | POA: Diagnosis not present

## 2016-12-25 NOTE — H&P (Signed)
[name]  [facility] Patient #: [id number] DOB: 08/16/1991 Single / Language: [place] / Race: Black or African American Female   History of Present Illness ([name] Zellars A. Magnus IvanBlackman MD;  Patient words: Hernia??.  The patient is a 26 year old female who presents with abdominal pain. This is a patient that I saw last year with right-sided abdominal pain. At that time, she had a fullness in the right side of the abdomen but I cannot feel a hernia defect. There was a question of a left inguinal hernia. We ordered a CAT scan of the abdomen and pelvis. This did not show any hernia but did show suggestion of a possible intussusception and inflammation in the bowel. She never came back to us for follow-up. She now reports she is still having right sided abdominal pain and swelling. She has not moved her bowels in 2 weeks but denies nausea or emesis. Again, she has no prior history of Crohn's disease or any other abnormality.   Allergies  Peanuts   Medication History Depo-Medrol (PF) (80MG /ML Suspension, Injection) Active. Medications Reconciled  Vitals   Weight: 135 lb Height: 62in Body Surface Area: 1.62 m Body Mass Index: 24.69 kg/m  Pulse: 90 (Regular)  BP: 122/80 (Sitting, Left Arm, Standard)    Physical Exam   The physical exam findings are as follows: Note:On exam, she is well appearance. Her abdomen is soft but there is tenderness to the right of the umbilicus and again a questionable fullness. I cannot feel a fascial defect at this area or at the umbilicus or inguinal areas. Lungs clear CV RRR Ext without edema Skin without rash    Assessment & Plan  ABDOMINAL PAIN, PERIUMBILICAL (R10.33) Impression: I am uncertain of the etiology of her pain. The CT scan from last year is concerning. I wonder whether she might have some type of inflammatory bowel disease like Crohn's. I believe she needs a CAT scan of her abdomen and pelvis  to reevaluate the abdominal wall as well as the inflammatory process in the intestines. I told her to start taking MiraLAX. I may need to refer her to a gastroenterologist depending on the CAT scan findings. Today, I cannot feel a hernia defect. There is no frank peritonitis. Labs done in the emergency department in October of this year are normal. I will see her back after the CT scan. I will try to call her with the results. Current Plans Instructed to make follow-up appointment for office visit following completion of diagnostic tests  Addendum:  The patient is a 26 year old female who presents with abdominal pain. She is here today for for a follow-up from her most recent CT scan of the abdomen and pelvis. She continues to have persistent periumbilical abdominal pain. It is sharp and cramping. She has decreased appetite. She still moves her bowels daily. She has to pull her legs up most the time to get comfortable. Previous CAT scan in 2016 suggested a possible intussusception. CT scan in 2017 was unremarkable per the radiologist but there is still a an evident umbilical hernia. ABDOMINAL PAIN, PERIUMBILICAL (R10.33) UMBILICAL HERNIA (K42.9) Impression: I discussed this with her in detail. I believe she needs a diagnostic laparoscopy as well as repair of the umbilical hernia. I'm suspicious that she may intermittently have some kind of intussusception or internal hernia that is causing discomfort that she describes. Still, the hernia may be the source of her discomfort. I discussed risks of surgery which includes but  is not limited to bleeding, infection, the need to convert to an open procedure, injury to his phrenic structures, the need for bowel resection, use of mesh, the chances may not resolve her symptoms, etc. She understands and agrees to proceed with surgery which will be scheduled urgently

## 2016-12-26 ENCOUNTER — Encounter (HOSPITAL_COMMUNITY)
Admission: RE | Disposition: A | Discharge status: Home / Self Care | Payer: Self-pay | Source: Ambulatory Visit | Attending: Surgery

## 2016-12-26 ENCOUNTER — Ambulatory Visit (HOSPITAL_COMMUNITY): Payer: Medicaid Other | Admitting: Certified Registered Nurse Anesthetist

## 2016-12-26 ENCOUNTER — Encounter (HOSPITAL_COMMUNITY): Payer: Self-pay | Admitting: Certified Registered Nurse Anesthetist

## 2016-12-26 ENCOUNTER — Observation Stay (HOSPITAL_COMMUNITY)
Admission: RE | Admit: 2016-12-26 | Discharge: 2016-12-28 | Disposition: A | Discharge status: Home / Self Care | Payer: Medicaid Other | Source: Ambulatory Visit | Attending: Surgery | Admitting: Surgery

## 2016-12-26 DIAGNOSIS — R339 Retention of urine, unspecified: Secondary | ICD-10-CM | POA: Insufficient documentation

## 2016-12-26 DIAGNOSIS — Z793 Long term (current) use of hormonal contraceptives: Secondary | ICD-10-CM | POA: Diagnosis not present

## 2016-12-26 DIAGNOSIS — K429 Umbilical hernia without obstruction or gangrene: Secondary | ICD-10-CM | POA: Diagnosis present

## 2016-12-26 DIAGNOSIS — Z87891 Personal history of nicotine dependence: Secondary | ICD-10-CM | POA: Diagnosis not present

## 2016-12-26 DIAGNOSIS — R1033 Periumbilical pain: Secondary | ICD-10-CM | POA: Diagnosis not present

## 2016-12-26 HISTORY — PX: LAPAROSCOPY: SHX197

## 2016-12-26 HISTORY — PX: INSERTION OF MESH: SHX5868

## 2016-12-26 HISTORY — PX: UMBILICAL HERNIA REPAIR: SHX196

## 2016-12-26 SURGERY — LAPAROSCOPY, DIAGNOSTIC
Anesthesia: General

## 2016-12-26 MED ORDER — CHLORHEXIDINE GLUCONATE CLOTH 2 % EX PADS: 6.0000 | MEDICATED_PAD | Freq: Once | CUTANEOUS | Status: DC

## 2016-12-26 MED ORDER — ONDANSETRON HCL 4 MG/2ML IJ SOLN
INTRAMUSCULAR | Status: DC | PRN
  Administered 2016-12-26: 4 mg via INTRAVENOUS

## 2016-12-26 MED ORDER — CEFAZOLIN SODIUM-DEXTROSE 2-4 GM/100ML-% IV SOLN
2.0000 g | INTRAVENOUS | Status: AC
  Administered 2016-12-26: 2 g via INTRAVENOUS
  Filled 2016-12-26: qty 100

## 2016-12-26 MED ORDER — 0.9 % SODIUM CHLORIDE (POUR BTL) OPTIME
TOPICAL | Status: DC | PRN
  Administered 2016-12-26: 1000 mL

## 2016-12-26 MED ORDER — ONDANSETRON 4 MG PO TBDP: 4.0000 mg | ORAL_TABLET | Freq: Four times a day (QID) | ORAL | Status: DC | PRN

## 2016-12-26 MED ORDER — PROPOFOL 10 MG/ML IV BOLUS
INTRAVENOUS | Status: DC | PRN
  Administered 2016-12-26: 150 mg via INTRAVENOUS

## 2016-12-26 MED ORDER — SODIUM CHLORIDE 0.9 % IV SOLN
INTRAVENOUS | Status: DC
  Administered 2016-12-26 – 2016-12-27 (×2): via INTRAVENOUS
  Filled 2016-12-26 (×8): qty 1000

## 2016-12-26 MED ORDER — SUGAMMADEX SODIUM 200 MG/2ML IV SOLN
INTRAVENOUS | Status: DC | PRN
  Administered 2016-12-26: 300 mg via INTRAVENOUS

## 2016-12-26 MED ORDER — FENTANYL CITRATE (PF) 100 MCG/2ML IJ SOLN
INTRAMUSCULAR | Status: AC
  Filled 2016-12-26: qty 4

## 2016-12-26 MED ORDER — ONDANSETRON HCL 4 MG/2ML IJ SOLN
INTRAMUSCULAR | Status: AC
  Filled 2016-12-26: qty 2

## 2016-12-26 MED ORDER — POTASSIUM CHLORIDE IN NACL 20-0.9 MEQ/L-% IV SOLN
INTRAVENOUS | Status: DC
  Filled 2016-12-26: qty 1000

## 2016-12-26 MED ORDER — DEXAMETHASONE SODIUM PHOSPHATE 10 MG/ML IJ SOLN
INTRAMUSCULAR | Status: DC | PRN
  Administered 2016-12-26: 10 mg via INTRAVENOUS

## 2016-12-26 MED ORDER — FENTANYL CITRATE (PF) 100 MCG/2ML IJ SOLN
INTRAMUSCULAR | Status: AC
  Filled 2016-12-26: qty 2

## 2016-12-26 MED ORDER — ACETAMINOPHEN 325 MG PO TABS: 650.0000 mg | ORAL_TABLET | Freq: Four times a day (QID) | ORAL | Status: DC | PRN

## 2016-12-26 MED ORDER — ROCURONIUM BROMIDE 100 MG/10ML IV SOLN
INTRAVENOUS | Status: DC | PRN
  Administered 2016-12-26: 50 mg via INTRAVENOUS

## 2016-12-26 MED ORDER — BUPIVACAINE HCL (PF) 0.5 % IJ SOLN
INTRAMUSCULAR | Status: DC | PRN
  Administered 2016-12-26: 20 mL

## 2016-12-26 MED ORDER — PROMETHAZINE HCL 25 MG/ML IJ SOLN: 6.2500 mg | INTRAMUSCULAR | Status: DC | PRN

## 2016-12-26 MED ORDER — LIDOCAINE HCL (CARDIAC) 20 MG/ML IV SOLN
INTRAVENOUS | Status: DC | PRN
  Administered 2016-12-26: 100 mg via INTRAVENOUS

## 2016-12-26 MED ORDER — HYDROMORPHONE HCL 2 MG/ML IJ SOLN
INTRAMUSCULAR | Status: AC
  Filled 2016-12-26: qty 1

## 2016-12-26 MED ORDER — DIPHENHYDRAMINE HCL 50 MG/ML IJ SOLN
12.5000 mg | INTRAMUSCULAR | Status: AC | PRN
  Administered 2016-12-26 (×2): 12.5 mg via INTRAVENOUS

## 2016-12-26 MED ORDER — ONDANSETRON HCL 4 MG/2ML IJ SOLN: 4.0000 mg | Freq: Four times a day (QID) | INTRAMUSCULAR | Status: DC | PRN

## 2016-12-26 MED ORDER — BUPIVACAINE HCL (PF) 0.5 % IJ SOLN
INTRAMUSCULAR | Status: AC
  Filled 2016-12-26: qty 30

## 2016-12-26 MED ORDER — HYDROMORPHONE HCL 2 MG/ML IJ SOLN: 1.0000 mg | INTRAMUSCULAR | Status: DC | PRN

## 2016-12-26 MED ORDER — ROCURONIUM BROMIDE 50 MG/5ML IV SOSY
PREFILLED_SYRINGE | INTRAVENOUS | Status: AC
  Filled 2016-12-26: qty 5

## 2016-12-26 MED ORDER — HYDROMORPHONE HCL 1 MG/ML IJ SOLN
INTRAMUSCULAR | Status: DC | PRN
  Administered 2016-12-26 (×2): 1 mg via INTRAVENOUS

## 2016-12-26 MED ORDER — SCOPOLAMINE 1 MG/3DAYS TD PT72
MEDICATED_PATCH | TRANSDERMAL | Status: AC
  Filled 2016-12-26: qty 1

## 2016-12-26 MED ORDER — DEXAMETHASONE SODIUM PHOSPHATE 10 MG/ML IJ SOLN
INTRAMUSCULAR | Status: AC
  Filled 2016-12-26: qty 1

## 2016-12-26 MED ORDER — ACETAMINOPHEN 500 MG PO TABS
1000.0000 mg | ORAL_TABLET | Freq: Four times a day (QID) | ORAL | Status: DC
  Administered 2016-12-26 – 2016-12-28 (×5): 1000 mg via ORAL
  Filled 2016-12-26 (×6): qty 2

## 2016-12-26 MED ORDER — FENTANYL CITRATE (PF) 250 MCG/5ML IJ SOLN
INTRAMUSCULAR | Status: DC | PRN
  Administered 2016-12-26 (×3): 100 ug via INTRAVENOUS

## 2016-12-26 MED ORDER — OXYCODONE HCL 5 MG PO TABS
5.0000 mg | ORAL_TABLET | ORAL | Status: DC | PRN
  Administered 2016-12-26 – 2016-12-28 (×8): 10 mg via ORAL
  Filled 2016-12-26 (×8): qty 2

## 2016-12-26 MED ORDER — DIPHENHYDRAMINE HCL 50 MG/ML IJ SOLN
INTRAMUSCULAR | Status: AC
  Administered 2016-12-26: 12.5 mg via INTRAVENOUS
  Filled 2016-12-26: qty 1

## 2016-12-26 MED ORDER — MIDAZOLAM HCL 5 MG/5ML IJ SOLN
INTRAMUSCULAR | Status: DC | PRN
  Administered 2016-12-26: 2 mg via INTRAVENOUS

## 2016-12-26 MED ORDER — FENTANYL CITRATE (PF) 100 MCG/2ML IJ SOLN
25.0000 ug | INTRAMUSCULAR | Status: DC | PRN
  Administered 2016-12-26: 25 ug via INTRAVENOUS

## 2016-12-26 MED ORDER — HYDROMORPHONE HCL 1 MG/ML IJ SOLN: 0.2500 mg | INTRAMUSCULAR | Status: DC | PRN

## 2016-12-26 MED ORDER — SCOPOLAMINE 1 MG/3DAYS TD PT72
1.0000 | MEDICATED_PATCH | TRANSDERMAL | Status: DC
  Administered 2016-12-26: 1.5 mg via TRANSDERMAL

## 2016-12-26 MED ORDER — FENTANYL CITRATE (PF) 100 MCG/2ML IJ SOLN
INTRAMUSCULAR | Status: AC
  Administered 2016-12-26: 25 ug via INTRAVENOUS
  Filled 2016-12-26: qty 2

## 2016-12-26 MED ORDER — ENOXAPARIN SODIUM 40 MG/0.4ML ~~LOC~~ SOLN
40.0000 mg | SUBCUTANEOUS | Status: DC
  Administered 2016-12-27 – 2016-12-28 (×2): 40 mg via SUBCUTANEOUS
  Filled 2016-12-26 (×2): qty 0.4

## 2016-12-26 MED ORDER — PROPOFOL 10 MG/ML IV BOLUS
INTRAVENOUS | Status: AC
  Filled 2016-12-26: qty 20

## 2016-12-26 MED ORDER — KETOROLAC TROMETHAMINE 30 MG/ML IJ SOLN
INTRAMUSCULAR | Status: DC | PRN
Start: 2016-12-26 — End: 2016-12-26
  Administered 2016-12-26: 30 mg via INTRAVENOUS

## 2016-12-26 MED ORDER — MIDAZOLAM HCL 2 MG/2ML IJ SOLN
INTRAMUSCULAR | Status: AC
  Filled 2016-12-26: qty 2

## 2016-12-26 MED ORDER — LACTATED RINGERS IV SOLN
INTRAVENOUS | Status: DC
  Administered 2016-12-26: 15:00:00 via INTRAVENOUS
  Administered 2016-12-26: 1000 mL via INTRAVENOUS

## 2016-12-26 MED ORDER — SUGAMMADEX SODIUM 200 MG/2ML IV SOLN
INTRAVENOUS | Status: AC
  Filled 2016-12-26: qty 4

## 2016-12-26 MED ORDER — ACETAMINOPHEN 650 MG RE SUPP: 650.0000 mg | Freq: Four times a day (QID) | RECTAL | Status: DC | PRN

## 2016-12-26 MED ORDER — LIDOCAINE 2% (20 MG/ML) 5 ML SYRINGE
INTRAMUSCULAR | Status: AC
  Filled 2016-12-26: qty 5

## 2016-12-26 SURGICAL SUPPLY — 79 items
APPLIER CLIP 5 13 M/L LIGAMAX5 (MISCELLANEOUS)
APPLIER CLIP ROT 10 11.4 M/L (STAPLE)
BINDER ABDOMINAL 12 ML 46-62 (SOFTGOODS) IMPLANT
BLADE EXTENDED COATED 6.5IN (ELECTRODE) IMPLANT
BLADE HEX COATED 2.75 (ELECTRODE) IMPLANT
BLADE SURG 15 STRL LF DISP TIS (BLADE) ×1 IMPLANT
BLADE SURG 15 STRL SS (BLADE) ×2
BLADE SURG SZ10 CARB STEEL (BLADE) IMPLANT
CHLORAPREP W/TINT 26ML (MISCELLANEOUS) ×3 IMPLANT
CLIP APPLIE 5 13 M/L LIGAMAX5 (MISCELLANEOUS) IMPLANT
CLIP APPLIE ROT 10 11.4 M/L (STAPLE) IMPLANT
CLOSURE WOUND 1/2 X4 (GAUZE/BANDAGES/DRESSINGS)
COVER MAYO STAND STRL (DRAPES) ×3 IMPLANT
COVER SURGICAL LIGHT HANDLE (MISCELLANEOUS) ×3 IMPLANT
DECANTER SPIKE VIAL GLASS SM (MISCELLANEOUS) ×3 IMPLANT
DERMABOND ADVANCED (GAUZE/BANDAGES/DRESSINGS) ×2
DERMABOND ADVANCED .7 DNX12 (GAUZE/BANDAGES/DRESSINGS) ×1 IMPLANT
DRAPE LAPAROSCOPIC ABDOMINAL (DRAPES) ×3 IMPLANT
DRAPE WARM FLUID 44X44 (DRAPE) IMPLANT
ELECT PENCIL ROCKER SW 15FT (MISCELLANEOUS) ×3 IMPLANT
ELECT REM PT RETURN 9FT ADLT (ELECTROSURGICAL) ×3
ELECTRODE REM PT RTRN 9FT ADLT (ELECTROSURGICAL) ×1 IMPLANT
FILTER SMOKE EVAC LAPAROSHD (FILTER) IMPLANT
GAUZE SPONGE 4X4 12PLY STRL (GAUZE/BANDAGES/DRESSINGS) ×3 IMPLANT
GLOVE BIO SURGEON STRL SZ7 (GLOVE) ×12 IMPLANT
GLOVE BIOGEL M 7.0 STRL (GLOVE) ×3 IMPLANT
GLOVE BIOGEL PI IND STRL 7.0 (GLOVE) ×1 IMPLANT
GLOVE BIOGEL PI IND STRL 7.5 (GLOVE) ×3 IMPLANT
GLOVE BIOGEL PI INDICATOR 7.0 (GLOVE) ×2
GLOVE BIOGEL PI INDICATOR 7.5 (GLOVE) ×6
GLOVE SURG SIGNA 7.5 PF LTX (GLOVE) ×3 IMPLANT
GOWN STRL REUS W/ TWL XL LVL3 (GOWN DISPOSABLE) ×1 IMPLANT
GOWN STRL REUS W/TWL LRG LVL3 (GOWN DISPOSABLE) ×6 IMPLANT
GOWN STRL REUS W/TWL XL LVL3 (GOWN DISPOSABLE) ×8 IMPLANT
HANDLE SUCTION POOLE (INSTRUMENTS) IMPLANT
IRRIG SUCT STRYKERFLOW 2 WTIP (MISCELLANEOUS)
IRRIGATION SUCT STRKRFLW 2 WTP (MISCELLANEOUS) IMPLANT
KIT BASIN OR (CUSTOM PROCEDURE TRAY) ×3 IMPLANT
MESH VENTRALEX ST 1-7/10 CRC S (Mesh General) ×3 IMPLANT
NEEDLE HYPO 22GX1.5 SAFETY (NEEDLE) ×3 IMPLANT
NS IRRIG 1000ML POUR BTL (IV SOLUTION) ×3 IMPLANT
PACK BASIC (CUSTOM PROCEDURE TRAY) ×3 IMPLANT
SEALER TISSUE X1 CVD JAW (INSTRUMENTS) IMPLANT
SHEARS HARMONIC ACE PLUS 36CM (ENDOMECHANICALS) IMPLANT
SLEEVE XCEL OPT CAN 5 100 (ENDOMECHANICALS) ×6 IMPLANT
SOLUTION ANTI FOG 6CC (MISCELLANEOUS) ×3 IMPLANT
SPONGE LAP 18X18 X RAY DECT (DISPOSABLE) ×3 IMPLANT
STAPLER VISISTAT 35W (STAPLE) IMPLANT
STRIP CLOSURE SKIN 1/2X4 (GAUZE/BANDAGES/DRESSINGS) IMPLANT
SUCTION POOLE HANDLE (INSTRUMENTS)
SUT MNCRL AB 4-0 PS2 18 (SUTURE) ×3 IMPLANT
SUT NOVA NAB DX-16 0-1 5-0 T12 (SUTURE) ×3 IMPLANT
SUT PDS AB 1 TP1 96 (SUTURE) IMPLANT
SUT PROLENE 2 0 KS (SUTURE) IMPLANT
SUT PROLENE 2 0 SH DA (SUTURE) IMPLANT
SUT SILK 2 0 (SUTURE)
SUT SILK 2 0 SH CR/8 (SUTURE) IMPLANT
SUT SILK 2-0 18XBRD TIE 12 (SUTURE) IMPLANT
SUT SILK 3 0 (SUTURE)
SUT SILK 3 0 SH CR/8 (SUTURE) IMPLANT
SUT SILK 3-0 18XBRD TIE 12 (SUTURE) IMPLANT
SUT VIC AB 3-0 SH 27 (SUTURE) ×4
SUT VIC AB 3-0 SH 27X BRD (SUTURE) ×2 IMPLANT
SUT VICRYL 2 0 18  UND BR (SUTURE)
SUT VICRYL 2 0 18 UND BR (SUTURE) IMPLANT
SYR 20CC LL (SYRINGE) ×3 IMPLANT
SYS LAPSCP GELPORT 120MM (MISCELLANEOUS)
SYSTEM LAPSCP GELPORT 120MM (MISCELLANEOUS) IMPLANT
TOWEL OR 17X26 10 PK STRL BLUE (TOWEL DISPOSABLE) ×3 IMPLANT
TOWEL OR NON WOVEN STRL DISP B (DISPOSABLE) ×3 IMPLANT
TRAY FOLEY W/METER SILVER 16FR (SET/KITS/TRAYS/PACK) ×3 IMPLANT
TRAY LAPAROSCOPIC (CUSTOM PROCEDURE TRAY) ×3 IMPLANT
TROCAR XCEL 12X100 BLDLESS (ENDOMECHANICALS) IMPLANT
TROCAR XCEL BLUNT TIP 100MML (ENDOMECHANICALS) IMPLANT
TROCAR XCEL NON-BLD 11X100MML (ENDOMECHANICALS) IMPLANT
TROCAR XCEL UNIV SLVE 11M 100M (ENDOMECHANICALS) IMPLANT
TUBING INSUF HEATED (TUBING) ×3 IMPLANT
YANKAUER SUCT BULB TIP 10FT TU (MISCELLANEOUS) IMPLANT
YANKAUER SUCT BULB TIP NO VENT (SUCTIONS) IMPLANT

## 2016-12-26 NOTE — Anesthesia Preprocedure Evaluation (Addendum)
Anesthesia Evaluation  Patient identified by MRN, date of birth, ID band Patient awake    Reviewed: Allergy & Precautions, NPO status , Patient's Chart, lab work & pertinent test results  History of Anesthesia Complications Negative for: history of anesthetic complications  Airway Mallampati: II  TM Distance: >3 FB Neck ROM: Full    Dental  (+) Chipped, Dental Advisory Given   Pulmonary asthma , former smoker,    Pulmonary exam normal        Cardiovascular negative cardio ROS Normal cardiovascular exam     Neuro/Psych  Headaches, PSYCHIATRIC DISORDERS Depression    GI/Hepatic Neg liver ROS,   Endo/Other  negative endocrine ROS  Renal/GU negative Renal ROS     Musculoskeletal   Abdominal   Peds  Hematology   Anesthesia Other Findings   Reproductive/Obstetrics                            Anesthesia Physical Anesthesia Plan  ASA: II  Anesthesia Plan: General   Post-op Pain Management:    Induction: Intravenous  Airway Management Planned: Oral ETT  Additional Equipment:   Intra-op Plan:   Post-operative Plan: Extubation in OR  Informed Consent: I have reviewed the patients History and Physical, chart, labs and discussed the procedure including the risks, benefits and alternatives for the proposed anesthesia with the patient or authorized representative who has indicated his/her understanding and acceptance.   Dental advisory given  Plan Discussed with: CRNA, Anesthesiologist and Surgeon  Anesthesia Plan Comments:        Anesthesia Quick Evaluation

## 2016-12-26 NOTE — Transfer of Care (Signed)
Immediate Anesthesia Transfer of Care Note  Patient: [name] EisenmengerAmber Po  Procedure(s) Performed: Procedure(s): LAPAROSCOPY DIAGNOSTIC (N/A) HERNIA REPAIR UMBILICAL ADULT (N/A) INSERTION OF MESH (N/A)  Patient Location: PACU  Anesthesia Type:General  Level of Consciousness: awake, alert  and oriented  Airway & Oxygen Therapy: Patient Spontanous Breathing and Patient connected to nasal cannula oxygen  Post-op Assessment: Report given to RN and Post -op Vital signs reviewed and stable  Post vital signs: Reviewed and stable  Last Vitals:  Vitals:   12/26/16 1232 12/26/16 1540  BP: 111/77   Pulse: 79   Resp: 16   Temp: 37 C 36.3 C    Last Pain:  Vitals:   12/26/16 1406  TempSrc:   PainSc: 3       Patients Stated Pain Goal: 3 (12/26/16 1406)  Complications: No apparent anesthesia complications

## 2016-12-26 NOTE — Anesthesia Postprocedure Evaluation (Addendum)
Anesthesia Post Note  Patient: Chemical engineerAmber [name]  Procedure(s) Performed: Procedure(s) (LRB): LAPAROSCOPY DIAGNOSTIC (N/A) HERNIA REPAIR UMBILICAL ADULT (N/A) INSERTION OF MESH (N/A)  Patient location during evaluation: PACU Anesthesia Type: General Level of consciousness: sedated Pain management: pain level controlled Vital Signs Assessment: post-procedure vital signs reviewed and stable Respiratory status: spontaneous breathing and respiratory function stable Cardiovascular status: stable Anesthetic complications: no       Last Vitals:  Vitals:   12/26/16 1615 12/26/16 1630  BP: 101/72 105/69  Pulse: 72 67  Resp: 17 16  Temp: 36.7 C 36.8 C                 Eli Pattillo DANIEL

## 2016-12-26 NOTE — Anesthesia Procedure Notes (Signed)
Date/Time: 12/26/2016 2:33 PM Performed by: Chandra Batch A Pre-anesthesia Checklist: Patient identified, Timeout performed, Emergency Drugs available, Suction available and Patient being monitored Patient Re-evaluated:Patient Re-evaluated prior to inductionOxygen Delivery Method: Circle system utilized Preoxygenation: Pre-oxygenation with 100% oxygen Intubation Type: IV induction Ventilation: Mask ventilation without difficulty Laryngoscope Size: Mac and 3 Grade View: Grade I Tube type: Oral Tube size: 7.5 mm Number of attempts: 1 Airway Equipment and Method: Stylet Placement Confirmation: ETT inserted through vocal cords under direct vision,  positive ETCO2 and breath sounds checked- equal and bilateral Secured at: 21 cm Tube secured with: Tape Dental Injury: Teeth and Oropharynx as per pre-operative assessment

## 2016-12-26 NOTE — Interval H&P Note (Signed)
History and Physical Interval Note: no change in H and P  12/26/2016 2:02 PM  [name] [name]  has presented today for surgery, with the diagnosis of umbilical hernia, chronic abdominal pain  The various methods of treatment have been discussed with the patient and family. After consideration of risks, benefits and other options for treatment, the patient has consented to  Procedure(s): LAPAROSCOPY DIAGNOSTIC (N/A) HERNIA REPAIR UMBILICAL ADULT (N/A) INSERTION OF MESH (N/A) as a surgical intervention .  The patient's history has been reviewed, patient examined, no change in status, stable for surgery.  I have reviewed the patient's chart and labs.  Questions were answered to the patient's satisfaction.     Onesimo Lingard A

## 2016-12-26 NOTE — Op Note (Signed)
LAPAROSCOPY DIAGNOSTIC, HERNIA REPAIR UMBILICAL ADULT, INSERTION OF MESH  Procedure Note  [name] [name] 12/26/2016   Pre-op Diagnosis: umbilical hernia, chronic abdominal pain     Post-op Diagnosis: same  Procedure(s): LAPAROSCOPY DIAGNOSTIC HERNIA REPAIR UMBILICAL ADULT INSERTION OF MESH (4.3 cm round from Bard)  Surgeon(s): [name] Miyamotoouglas Everette Mall, MD  Anesthesia: General  Staff:  Circulator: [name] MccreedyJulie Jones, RN Relief Scrub: [name] [name], CST Scrub Person: [name] [name]; [name] [name], CST  Estimated Blood Loss: Minimal               Indications: This is [name] 26 year old female with more than one year history of chronic abdominal pain with decreased appetite. Over [name] year ago, she had [name] CT scan which suggested an intussusception. Her last CT scan in December 2017 was normal. She does have [name] small under local hernia. Because of the severity of her symptoms, the decision was made to proceed with [name] diagnostic laparoscopy as well as the umbilical hernia repair.  Findings: The patient's visible large intestines, small intestines, stomach, first portion of the duodenum, liver, gallbladder, and pelvic organs appeared normal. There was only [name] small fascial defect at the umbilicus.  Procedure: The patient was brought to the operating room and identified the correct patient. She was placed supine on the operating table in generalized seizure was induced. Her abdomen was then prepped and draped in the usual sterile fashion. I made [name] small incision with [name] scalpel in the patient's left upper quadrant. I then used the 5 mm trocar and Optiview camera to slowly traverse all levels of the abdominal wall under direct vision and gain entrance in the perineal cavity. Insufflation of the abdomen was then begun. I examined the introduction site laparoscopic [name] AlconLeighton saw no evidence of bowel [name]. I then placed 2 more 5 mm trochars in the patient's left mid abdomen and left lower quadrant both under direct  vision. I then ran the small bowel from the terminal ileum to the ligament of Treitz and then back again from ligament of Treitz to the terminal ileum and saw no evidence of any abnormality, diverticulum, internal hernia, or intussusception. The visible right colon, transverse colon, and left colon appeared normal. The appendix was normal. The gallbladder was normal. The liver was normal. With the patient in Trendelenburg, I could easily visualize the pelvic organs which appear normal as well. I then placed the patient in the reverse Trendelenburg position. The stomach and first portion of the duodenum also appeared normal. The patient had [name] small fascial defect at the umbilicus consistent with an umbilical hernia. At this point, with no clear other intra-abdominal pathology, I removed all trochars under direct vision and deflated the abdomen. I then made [name] semicircular incision in the lower edge of the umbilicus with scalpel. I took this down to the underlying hernia defect which I separated from the overlying umbilical skin. The fascial defect was approximately 1-1/2 cm in size. I brought [name] 4.3 cm round ventral patch from Bard onto the field. I placed into the fascial opening and then pulled up against the peritoneum with the stay ties. I then sewed the mesh and circumferentially with #1 Novafil sutures. I then cut the stay ties and closed the fascia over top of the mesh with [name] figure-of-eight #1 Novafil suture. I then anesthetized the incisions and fascia with Marcaine. I placed in the local scan back in the correct location with [name] 3-0 Vicryl suture. I then closed the subcutaneous tissue with 3-0 Vicryl sutures.  All skin incisions were then closed with 4-0 Monocryl and Dermabond. The patient tolerated procedure well. All the counts were correct at the end of the procedure. The patient was then extubated in the operating room and taken in [name] stable condition to the recovery room.          [name] [name]    Date: 12/26/2016  Time: 3:31 PM

## 2016-12-26 NOTE — Anesthesia Procedure Notes (Signed)
Procedure Name: Intubation Date/Time: 12/26/2016 2:33 PM Performed by: Chandra Batch A Pre-anesthesia Checklist: Patient identified, Timeout performed, Emergency Drugs available, Suction available and Patient being monitored Patient Re-evaluated:Patient Re-evaluated prior to inductionOxygen Delivery Method: Circle system utilized Preoxygenation: Pre-oxygenation with 100% oxygen Intubation Type: IV induction Ventilation: Mask ventilation without difficulty Laryngoscope Size: Mac and 3 Grade View: Grade I Tube size: 7.5 mm Number of attempts: 1 Airway Equipment and Method: Stylet Placement Confirmation: ETT inserted through vocal cords under direct vision,  positive ETCO2 and breath sounds checked- equal and bilateral Secured at: 21 cm Tube secured with: Tape Dental Injury: Teeth and Oropharynx as per pre-operative assessment

## 2016-12-27 ENCOUNTER — Encounter (HOSPITAL_COMMUNITY): Payer: Self-pay | Admitting: Surgery

## 2016-12-27 DIAGNOSIS — R1033 Periumbilical pain: Secondary | ICD-10-CM | POA: Diagnosis not present

## 2016-12-27 LAB — CBC
HCT: 35 % — ABNORMAL LOW (ref 36.0–46.0)
Hemoglobin: 11.3 g/dL — ABNORMAL LOW (ref 12.0–15.0)
MCH: 25.8 pg — ABNORMAL LOW (ref 26.0–34.0)
MCHC: 32.3 g/dL (ref 30.0–36.0)
MCV: 79.9 fL (ref 78.0–100.0)
PLATELETS: 307 10*3/uL (ref 150–400)
RBC: 4.38 MIL/uL (ref 3.87–5.11)
RDW: 14.1 % (ref 11.5–15.5)
WBC: 9.4 10*3/uL (ref 4.0–10.5)

## 2016-12-27 LAB — BASIC METABOLIC PANEL
Anion gap: 6 (ref 5–15)
BUN: 10 mg/dL (ref 6–20)
CO2: 26 mmol/L (ref 22–32)
CREATININE: 0.74 mg/dL (ref 0.44–1.00)
Calcium: 8.9 mg/dL (ref 8.9–10.3)
Chloride: 108 mmol/L (ref 101–111)
GFR calc Af Amer: >60 mL/min (ref >60)
GLUCOSE: 97 mg/dL (ref 65–99)
Potassium: 4 mmol/L (ref 3.5–5.1)
SODIUM: 140 mmol/L (ref 135–145)

## 2016-12-27 MED ORDER — OXYCODONE-ACETAMINOPHEN 5-325 MG PO TABS: 1.0000 | ORAL_TABLET | Freq: Four times a day (QID) | ORAL | 0 refills | Status: DC | PRN

## 2016-12-27 MED ORDER — SODIUM CHLORIDE 0.9 % IV BOLUS (SEPSIS)
1000.0000 mL | Freq: Once | INTRAVENOUS | Status: AC
  Administered 2016-12-27: 1000 mL via INTRAVENOUS

## 2016-12-27 NOTE — Discharge Summary (Signed)
Physician Discharge Summary  Patient ID: [name] [name] MRN: [id number] DOB/AGE: January 08, 1991 25 y.o.  Admit date: 12/26/2016 Discharge date: 12/27/2016  Admission Diagnoses:  Discharge Diagnoses:  Active Problems:   Umbilical hernia abdominal pain unknown etiology  Discharged Condition: good  Hospital Course: uneventful post op recovery s/p surgery.  Discharged POD#1  Consults: None  Significant Diagnostic Studies:   Treatments: surgery: diagnostic laparoscopy, umbilical hernia repair with lunch  Discharge Exam: Blood pressure 101/67, pulse (!) 53, temperature 98.3 F (36.8 C), temperature source Oral, resp. rate 14, height 5\' 2"  (1.575 m), weight 58.5 kg (129 lb), SpO2 100 %. General appearance: alert, cooperative and no distress Resp: clear to auscultation bilaterally Cardio: regular rate and rhythm, S1, S2 normal, no murmur, click, rub or gallop Incision/Wound:abdomen soft, incisions clean  Disposition: 01-Home or Self Care   Allergies as of 12/27/2016      Reactions   Iohexol Hives, Shortness Of Breath, Itching, Nausea And Vomiting, Swelling, Cough   PER DR BARRY, PRE MED PROTOCOL SHOULD BE ADMINISTERED PRIOR TO SCANS IN THE FUTURE.  PT HAD [name] SIGNIFICANT REACTION.  [facility]   Peanut-containing Drug Products Swelling   All nuts      Medication List    TAKE these medications   cyclobenzaprine 5 MG tablet Commonly known as:  FLEXERIL Take 1 tablet (5 mg total) by mouth 2 (two) times daily as needed for muscle spasms.   HYDROcodone-acetaminophen 5-325 MG tablet Commonly known as:  NORCO/VICODIN Take 1 tablet by mouth every 4 (four) hours as needed.   ibuprofen 400 MG tablet Commonly known as:  ADVIL,MOTRIN Take 1 tablet (400 mg total) by mouth every 6 (six) hours as needed.   medroxyPROGESTERone 150 MG/ML injection Commonly known as:  DEPO-PROVERA Inject 150 mg into the muscle every 3 (three) months.   oxyCODONE-acetaminophen 5-325 MG tablet Commonly known as:   ROXICET Take 1-2 tablets by mouth every 6 (six) hours as needed for severe pain.      Follow-up Information    [name] Manson A, MD. Schedule an appointment as soon as possible for [name] visit in 3 week(s).   Specialty:  General Surgery Contact information: [address] [place] [address] [phone]           Signed: [name],[name] [name] 12/27/2016, 6:40 AM

## 2016-12-27 NOTE — Discharge Instructions (Signed)
[facility] ______[facility] LAPAROSCOPIC SURGERY: POST OP INSTRUCTIONS Always review your discharge instruction sheet given to you by the facility where your surgery was performed. IF YOU HAVE DISABILITY OR FAMILY LEAVE FORMS, YOU MUST BRING THEM TO THE OFFICE FOR PROCESSING.   DO NOT GIVE THEM TO YOUR DOCTOR.  1. A prescription for pain medication may be given to you upon discharge.  Take your pain medication as prescribed, if needed.  If narcotic pain medicine is not needed, then you may take acetaminophen (Tylenol) or ibuprofen (Advil) as needed. 2. Take your usually prescribed medications unless otherwise directed. 3. If you need a refill on your pain medication, please contact your pharmacy.  They will contact our office to request authorization. Prescriptions will not be filled after 5pm or on week-ends. 4. You should follow a light diet the first few days after arrival home, such as soup and crackers, etc.  Be sure to include lots of fluids daily. 5. Most patients will experience some swelling and bruising in the area of the incisions.  Ice packs will help.  Swelling and bruising can take several days to resolve.  6. It is common to experience some constipation if taking pain medication after surgery.  Increasing fluid intake and taking a stool softener (such as Colace) will usually help or prevent this problem from occurring.  A mild laxative (Milk of Magnesia or Miralax) should be taken according to package instructions if there are no bowel movements after 48 hours. 7. Unless discharge instructions indicate otherwise, you may remove your bandages 24-48 hours after surgery, and you may shower at that time.  You may have steri-strips (small skin tapes) in place directly over the incision.  These strips should be left on the skin for 7-10 days.  If your surgeon used skin glue on the incision, you may shower in 24 hours.  The glue will flake off over the next 2-3 weeks.  Any sutures or  staples will be removed at the office during your follow-up visit. 8. ACTIVITIES:  You may resume regular (light) daily activities beginning the next day--such as daily self-care, walking, climbing stairs--gradually increasing activities as tolerated.  You may have sexual intercourse when it is comfortable.  Refrain from any heavy lifting or straining until approved by your doctor. a. You may drive when you are no longer taking prescription pain medication, you can comfortably wear a seatbelt, and you can safely maneuver your car and apply brakes. b. RETURN TO WORK:  __________________________________________________________ 9. You should see your doctor in the office for a follow-up appointment approximately 2-3 weeks after your surgery.  Make sure that you call for this appointment within a day or two after you arrive home to insure a convenient appointment time. 10. OTHER INSTRUCTIONS: 11. NO LIFTING MORE THAN 15 POUNDS FOR 4 WEEKS 12. OK TO SHOWER TODAY 13. NEED TO USE ICE PACK AND IBUPROFEN/ADVIL/MOTRIN ALSO FOR PAIN __________________________________________________________________________________________________________________________ __________________________________________________________________________________________________________________________ WHEN TO CALL YOUR DOCTOR: 1. Fever over 101.0 2. Inability to urinate 3. Continued bleeding from incision. 4. Increased pain, redness, or drainage from the incision. 5. Increasing abdominal pain  The clinic staff is available to answer your questions during regular business hours.  Please dont hesitate to call and ask to speak to one of the nurses for clinical concerns.  If you have a medical emergency, go to the nearest emergency room or call 911.  [facility] is always on call at the hospital. [address]  [address], [place], [place], [place]  [address] ? [address], [place], [place]   [address] [phone] ?  [phone] ? FAX [phone] Web site: [email or web address]

## 2016-12-27 NOTE — Progress Notes (Signed)
1 Day Post-Op  Subjective: Patient complains of incisional pain.  Having some difficulty voiding.  Objective: Vital signs in last 24 hours: Temp:  [97.4 F (36.3 C)-98.6 F (37 C)] 98.3 F (36.8 C) (02/01 0429) Pulse Rate:  [53-93] 53 (02/01 0429) Resp:  [14-19] 14 (02/01 0429) BP: (100-111)/(65-77) 101/67 (02/01 0429) SpO2:  [93 %-100 %] 100 % (02/01 0429) Weight:  [58.5 kg (129 lb)] 58.5 kg (129 lb) (01/31 1234) Last BM Date: 12/24/16  Intake/Output from previous day: 01/31 0701 - 02/01 0700 In: 3238.8 [P.O.:220; I.V.:3018.8] Out: 260 [Urine:250; Blood:10] Intake/Output this shift: Total I/O In: 1375 [I.V.:1375] Out: 250 [Urine:250]  Exam: Looks comfortable CV RRR Abdomen soft, non distended, appropriately tender  Lab Results:   Recent Labs  12/27/16 0436  WBC 9.4  HGB 11.3*  HCT 35.0*  PLT 307   BMET  Recent Labs  12/27/16 0436  NA 140  K 4.0  CL 108  CO2 26  GLUCOSE 97  BUN 10  CREATININE 0.74  CALCIUM 8.9   PT/INR No results for input(s): LABPROT, INR in the last 72 hours. ABG No results for input(s): PHART, HCO3 in the last 72 hours.  Invalid input(s): PCO2, PO2  Studies/Results: No results found.  Anti-infectives: Anti-infectives    Start     Dose/Rate Route Frequency Ordered Stop   12/26/16 1230  ceFAZolin (ANCEF) IVPB 2g/100 mL premix     2 g 200 mL/hr over 30 Minutes Intravenous On call to O.R. 12/26/16 1216 12/26/16 1435      Assessment/Plan: s/p Procedure(s): LAPAROSCOPY DIAGNOSTIC (N/A) HERNIA REPAIR UMBILICAL ADULT (N/A) INSERTION OF MESH (N/A)  Patient stable after surgery. Source of abdominal complaints not found.  Laparoscopy normal except for small umbilical hernia Plan discharge today after lunch if patient able to void.  LOS: 0 days    Margarette Vannatter A 12/27/2016

## 2016-12-27 NOTE — Progress Notes (Signed)
Pt c/o being unable to void.  Bladder scan results yielded 0 ml this am and 80 ml at 1400.  MD notified. Orders received for NS bolus and foley catheter if pt still unable to void. Will continue to monitor.

## 2016-12-27 NOTE — Progress Notes (Signed)
Pt still states she is unable to void. Foley placed per order, by 2 RNs. 300cc's of urine with sediment returned. MD on call notified. Orders to cancel DC for this afternoon

## 2016-12-28 DIAGNOSIS — R1033 Periumbilical pain: Secondary | ICD-10-CM | POA: Diagnosis not present

## 2016-12-28 LAB — URINALYSIS, ROUTINE W REFLEX MICROSCOPIC
BILIRUBIN URINE: NEGATIVE
Glucose, UA: NEGATIVE mg/dL
HGB URINE DIPSTICK: NEGATIVE
KETONES UR: NEGATIVE mg/dL
NITRITE: NEGATIVE
PROTEIN: NEGATIVE mg/dL
Specific Gravity, Urine: 1.011 (ref 1.005–1.030)
pH: 8 (ref 5.0–8.0)

## 2016-12-28 LAB — CBC
HCT: 30.8 % — ABNORMAL LOW (ref 36.0–46.0)
Hemoglobin: 10 g/dL — ABNORMAL LOW (ref 12.0–15.0)
MCH: 26.2 pg (ref 26.0–34.0)
MCHC: 32.5 g/dL (ref 30.0–36.0)
MCV: 80.8 fL (ref 78.0–100.0)
PLATELETS: 252 10*3/uL (ref 150–400)
RBC: 3.81 MIL/uL — ABNORMAL LOW (ref 3.87–5.11)
RDW: 14.4 % (ref 11.5–15.5)
WBC: 5.6 10*3/uL (ref 4.0–10.5)

## 2016-12-28 LAB — BASIC METABOLIC PANEL
ANION GAP: 6 (ref 5–15)
BUN: 8 mg/dL (ref 6–20)
CALCIUM: 8.6 mg/dL — AB (ref 8.9–10.3)
CO2: 25 mmol/L (ref 22–32)
Chloride: 106 mmol/L (ref 101–111)
Creatinine, Ser: 0.78 mg/dL (ref 0.44–1.00)
Glucose, Bld: 100 mg/dL — ABNORMAL HIGH (ref 65–99)
Potassium: 3.5 mmol/L (ref 3.5–5.1)
SODIUM: 137 mmol/L (ref 135–145)

## 2016-12-28 NOTE — Progress Notes (Signed)
Pt dc via wheelchair accompanied by nurse tech and sig other. Pt stable [name] [name], [name] [name]

## 2016-12-28 NOTE — Progress Notes (Signed)
2 Days Post-Op  Subjective: Good UOP since foley placed Pain about the same  Objective: Vital signs in last 24 hours: Temp:  [98.8 F (37.1 C)-99.2 F (37.3 C)] 99 F (37.2 C) (02/02 0510) Pulse Rate:  [61-90] 90 (02/02 0510) Resp:  [14-16] 16 (02/02 0510) BP: (102-114)/(65-83) 108/65 (02/02 0510) SpO2:  [98 %-100 %] 98 % (02/02 0510) Last BM Date: 12/24/16  Intake/Output from previous day: 02/01 0701 - 02/02 0700 In: 240 [P.O.:240] Out: 1500 [Urine:1500] Intake/Output this shift: Total I/O In: -  Out: 1200 [Urine:1200]  Exam: Appears comfortable Abdomen soft, appropriately tender  Lab Results:   Recent Labs  12/27/16 0436  WBC 9.4  HGB 11.3*  HCT 35.0*  PLT 307   BMET  Recent Labs  12/27/16 0436  NA 140  K 4.0  CL 108  CO2 26  GLUCOSE 97  BUN 10  CREATININE 0.74  CALCIUM 8.9   PT/INR No results for input(s): LABPROT, INR in the last 72 hours. ABG No results for input(s): PHART, HCO3 in the last 72 hours.  Invalid input(s): PCO2, PO2  Studies/Results: No results found.  Anti-infectives: Anti-infectives    Start     Dose/Rate Route Frequency Ordered Stop   12/26/16 1230  ceFAZolin (ANCEF) IVPB 2g/100 mL premix     2 g 200 mL/hr over 30 Minutes Intravenous On call to O.R. 12/26/16 1216 12/26/16 1435      Assessment/Plan: s/p Procedure(s): LAPAROSCOPY DIAGNOSTIC (N/[name]) HERNIA REPAIR UMBILICAL ADULT (N/[name]) INSERTION OF MESH (N/[name])  Post op urinary retention  Will check labs, UA this morning Remove foley Home after lunch if able to void  LOS: 0 days    [name] [name] 12/28/2016

## 2017-04-27 NOTE — Addendum Note (Signed)
Addendum  created 04/27/17 0857 by Cosette Prindle, MD   Sign clinical note    

## 2017-08-18 ENCOUNTER — Emergency Department (HOSPITAL_COMMUNITY): Payer: Medicaid Other

## 2017-08-18 ENCOUNTER — Emergency Department (HOSPITAL_COMMUNITY)
Admission: EM | Admit: 2017-08-18 | Discharge: 2017-08-19 | Disposition: A | Discharge status: Home / Self Care | Payer: Medicaid Other | Attending: Emergency Medicine | Admitting: Emergency Medicine

## 2017-08-18 ENCOUNTER — Encounter (HOSPITAL_COMMUNITY): Payer: Self-pay | Admitting: *Deleted

## 2017-08-18 DIAGNOSIS — R040 Epistaxis: Secondary | ICD-10-CM | POA: Insufficient documentation

## 2017-08-18 DIAGNOSIS — T887XXA Unspecified adverse effect of drug or medicament, initial encounter: Secondary | ICD-10-CM | POA: Diagnosis not present

## 2017-08-18 DIAGNOSIS — R109 Unspecified abdominal pain: Secondary | ICD-10-CM | POA: Diagnosis present

## 2017-08-18 DIAGNOSIS — R1110 Vomiting, unspecified: Secondary | ICD-10-CM | POA: Diagnosis not present

## 2017-08-18 DIAGNOSIS — R519 Headache, unspecified: Secondary | ICD-10-CM

## 2017-08-18 DIAGNOSIS — T508X5A Adverse effect of diagnostic agents, initial encounter: Secondary | ICD-10-CM | POA: Diagnosis not present

## 2017-08-18 DIAGNOSIS — Y69 Unspecified misadventure during surgical and medical care: Secondary | ICD-10-CM | POA: Diagnosis not present

## 2017-08-18 DIAGNOSIS — J45909 Unspecified asthma, uncomplicated: Secondary | ICD-10-CM | POA: Insufficient documentation

## 2017-08-18 DIAGNOSIS — R630 Anorexia: Secondary | ICD-10-CM | POA: Insufficient documentation

## 2017-08-18 DIAGNOSIS — R51 Headache: Secondary | ICD-10-CM | POA: Diagnosis not present

## 2017-08-18 LAB — COMPREHENSIVE METABOLIC PANEL
ALT: 17 U/L (ref 14–54)
ANION GAP: 6 (ref 5–15)
AST: 22 U/L (ref 15–41)
Albumin: 4.2 g/dL (ref 3.5–5.0)
Alkaline Phosphatase: 52 U/L (ref 38–126)
BILIRUBIN TOTAL: 0.9 mg/dL (ref 0.3–1.2)
BUN: 15 mg/dL (ref 6–20)
CALCIUM: 9.6 mg/dL (ref 8.9–10.3)
CO2: 25 mmol/L (ref 22–32)
CREATININE: 0.81 mg/dL (ref 0.44–1.00)
Chloride: 108 mmol/L (ref 101–111)
Glucose, Bld: 89 mg/dL (ref 65–99)
Potassium: 3.5 mmol/L (ref 3.5–5.1)
SODIUM: 139 mmol/L (ref 135–145)
TOTAL PROTEIN: 7.5 g/dL (ref 6.5–8.1)

## 2017-08-18 LAB — CBC
HCT: 39.2 % (ref 36.0–46.0)
HEMOGLOBIN: 12.5 g/dL (ref 12.0–15.0)
MCH: 26.2 pg (ref 26.0–34.0)
MCHC: 31.9 g/dL (ref 30.0–36.0)
MCV: 82.2 fL (ref 78.0–100.0)
PLATELETS: 258 10*3/uL (ref 150–400)
RBC: 4.77 MIL/uL (ref 3.87–5.11)
RDW: 14 % (ref 11.5–15.5)
WBC: 6.5 10*3/uL (ref 4.0–10.5)

## 2017-08-18 LAB — URINALYSIS, ROUTINE W REFLEX MICROSCOPIC
Bacteria, UA: NONE SEEN
Bilirubin Urine: NEGATIVE
GLUCOSE, UA: NEGATIVE mg/dL
HGB URINE DIPSTICK: NEGATIVE
Ketones, ur: NEGATIVE mg/dL
NITRITE: NEGATIVE
PH: 5 (ref 5.0–8.0)
Protein, ur: NEGATIVE mg/dL
Specific Gravity, Urine: 1.021 (ref 1.005–1.030)

## 2017-08-18 LAB — LIPASE, BLOOD: LIPASE: 24 U/L (ref 11–51)

## 2017-08-18 LAB — MAGNESIUM: Magnesium: 2.4 mg/dL (ref 1.7–2.4)

## 2017-08-18 LAB — PREGNANCY, URINE: Preg Test, Ur: NEGATIVE

## 2017-08-18 MED ORDER — MORPHINE SULFATE (PF) 4 MG/ML IV SOLN
4.0000 mg | Freq: Once | INTRAVENOUS | Status: AC
Start: 2017-08-18 — End: 2017-08-18
  Administered 2017-08-18: 4 mg via INTRAVENOUS
  Filled 2017-08-18: qty 1

## 2017-08-18 MED ORDER — IPRATROPIUM-ALBUTEROL 0.5-2.5 (3) MG/3ML IN SOLN
3.0000 mL | Freq: Once | RESPIRATORY_TRACT | Status: AC
  Administered 2017-08-18: 3 mL via RESPIRATORY_TRACT
  Filled 2017-08-18: qty 3

## 2017-08-18 MED ORDER — FAMOTIDINE IN NACL 20-0.9 MG/50ML-% IV SOLN
20.0000 mg | Freq: Once | INTRAVENOUS | Status: AC
  Administered 2017-08-18: 20 mg via INTRAVENOUS
  Filled 2017-08-18: qty 50

## 2017-08-18 MED ORDER — MORPHINE SULFATE (PF) 4 MG/ML IV SOLN
4.0000 mg | Freq: Once | INTRAVENOUS | Status: AC
  Administered 2017-08-18: 4 mg via INTRAVENOUS
  Filled 2017-08-18: qty 1

## 2017-08-18 MED ORDER — LORAZEPAM 2 MG/ML IJ SOLN
0.5000 mg | Freq: Once | INTRAMUSCULAR | Status: AC
  Administered 2017-08-18: 0.5 mg via INTRAVENOUS

## 2017-08-18 MED ORDER — KETOROLAC TROMETHAMINE 15 MG/ML IJ SOLN
30.0000 mg | Freq: Once | INTRAMUSCULAR | Status: AC
  Administered 2017-08-18: 30 mg via INTRAVENOUS
  Filled 2017-08-18: qty 2

## 2017-08-18 MED ORDER — IOPAMIDOL (ISOVUE-300) INJECTION 61%
100.0000 mL | Freq: Once | INTRAVENOUS | Status: AC | PRN
  Administered 2017-08-18: 100 mL via INTRAVENOUS

## 2017-08-18 MED ORDER — LORAZEPAM 2 MG/ML IJ SOLN
INTRAMUSCULAR | Status: AC
  Filled 2017-08-18: qty 1

## 2017-08-18 MED ORDER — METOCLOPRAMIDE HCL 5 MG/ML IJ SOLN
10.0000 mg | Freq: Once | INTRAMUSCULAR | Status: AC
  Administered 2017-08-18: 10 mg via INTRAVENOUS
  Filled 2017-08-18: qty 2

## 2017-08-18 MED ORDER — ONDANSETRON HCL 4 MG/2ML IJ SOLN
4.0000 mg | Freq: Once | INTRAMUSCULAR | Status: AC
  Administered 2017-08-18: 4 mg via INTRAVENOUS
  Filled 2017-08-18: qty 2

## 2017-08-18 MED ORDER — IOPAMIDOL (ISOVUE-300) INJECTION 61%
INTRAVENOUS | Status: AC
  Filled 2017-08-18: qty 30

## 2017-08-18 MED ORDER — HYDROCORTISONE NA SUCCINATE PF 250 MG IJ SOLR
200.0000 mg | Freq: Once | INTRAMUSCULAR | Status: AC
  Administered 2017-08-18: 200 mg via INTRAVENOUS
  Filled 2017-08-18: qty 200

## 2017-08-18 MED ORDER — DIPHENHYDRAMINE HCL 50 MG/ML IJ SOLN
50.0000 mg | Freq: Once | INTRAMUSCULAR | Status: AC
  Administered 2017-08-18: 50 mg via INTRAVENOUS
  Filled 2017-08-18: qty 1

## 2017-08-18 MED ORDER — MAGNESIUM SULFATE 2 GM/50ML IV SOLN
2.0000 g | Freq: Once | INTRAVENOUS | Status: AC
  Administered 2017-08-18: 2 g via INTRAVENOUS
  Filled 2017-08-18: qty 50

## 2017-08-18 MED ORDER — BARIUM SULFATE 2.1 % PO SUSP
ORAL | Status: AC
  Filled 2017-08-18: qty 2

## 2017-08-18 MED ORDER — SODIUM CHLORIDE 0.9 % IV BOLUS (SEPSIS)
1000.0000 mL | Freq: Once | INTRAVENOUS | Status: AC
  Administered 2017-08-18: 1000 mL via INTRAVENOUS

## 2017-08-18 MED ORDER — EPINEPHRINE 0.3 MG/0.3ML IJ SOAJ
INTRAMUSCULAR | Status: AC
  Filled 2017-08-18: qty 0.3

## 2017-08-18 MED ORDER — DIPHENHYDRAMINE HCL 50 MG/ML IJ SOLN
25.0000 mg | Freq: Once | INTRAMUSCULAR | Status: AC
  Administered 2017-08-18: 25 mg via INTRAVENOUS
  Filled 2017-08-18: qty 1

## 2017-08-18 MED ORDER — FENTANYL CITRATE (PF) 100 MCG/2ML IJ SOLN
50.0000 ug | Freq: Once | INTRAMUSCULAR | Status: AC
  Administered 2017-08-18: 50 ug via INTRAVENOUS
  Filled 2017-08-18: qty 2

## 2017-08-18 MED ORDER — DIPHENHYDRAMINE HCL 25 MG PO CAPS: 50.0000 mg | ORAL_CAPSULE | Freq: Once | ORAL | Status: AC
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
## 2017-08-18 NOTE — ED Triage Notes (Signed)
Pt states she had hernia surgery in May and for 2 weeks started having right sided abdominal swelling and cannot eat.  Pt sates that she has frontal headache since Thursday and has had 2 nosebleeds since Thursday.

## 2017-08-18 NOTE — ED Notes (Signed)
Benadryl to be administered at 2120. CT scan to be done at 2220
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
## 2017-08-18 NOTE — ED Notes (Signed)
Patient transported to CT 

## 2017-08-18 NOTE — ED Notes (Signed)
Pt had allergic reaction to CT contrast dye, despite pre-medicating. Pt presenting with hives, itching, and SOB. EDP & Rapid Response RN contacted.

## 2017-08-19 ENCOUNTER — Telehealth: Payer: Self-pay

## 2017-08-19 MED ORDER — BUTALBITAL-APAP-CAFFEINE 50-325-40 MG PO TABS
2.0000 | ORAL_TABLET | Freq: Once | ORAL | Status: AC
  Administered 2017-08-19: 2 via ORAL
  Filled 2017-08-19: qty 2

## 2017-08-19 MED ORDER — DIPHENHYDRAMINE HCL 25 MG PO CAPS: 25.0000 mg | ORAL_CAPSULE | Freq: Four times a day (QID) | ORAL | 0 refills | Status: DC

## 2017-08-19 MED ORDER — FAMOTIDINE 20 MG PO TABS: 20.0000 mg | ORAL_TABLET | Freq: Two times a day (BID) | ORAL | 0 refills | Status: DC

## 2017-08-19 MED ORDER — BUTALBITAL-APAP-CAFFEINE 50-325-40 MG PO TABS: 1.0000 | ORAL_TABLET | Freq: Four times a day (QID) | ORAL | 0 refills | Status: DC | PRN

## 2017-08-19 MED ORDER — PREDNISONE 10 MG (21) PO TBPK: ORAL_TABLET | ORAL | 0 refills | Status: DC

## 2017-08-19 MED ORDER — EPINEPHRINE 0.3 MG/0.3ML IJ SOAJ: 0.3000 mg | Freq: Once | INTRAMUSCULAR | 1 refills | Status: AC

## 2017-08-19 MED ORDER — METOCLOPRAMIDE HCL 10 MG PO TABS: 10.0000 mg | ORAL_TABLET | Freq: Four times a day (QID) | ORAL | 0 refills | Status: DC

## 2017-08-19 NOTE — ED Notes (Signed)
Pt departed in NAD.  

## 2017-08-19 NOTE — Progress Notes (Signed)
Rapid response call placed for CT room 1. On arrival pt sitting upright, respiratory distress, placed on 2L [place] PTA, redness to face, lips, nose and neck. Pt just finished a CT scan of her ABD. She had allergic reaction to IV contrast in December, card indicated to premedicate pt prior to any future scans. Pt had allergic reaction in spite of being pre-medicated. Ativan 0.5 mg IVP, Fentanyl 25 mcg IVP, Benadryl 25 mg IVP, and neb treatment given. EDP and EDPA at bedside on my arrival.

## 2017-08-19 NOTE — Telephone Encounter (Signed)
Spoke with patient. She called the Concussion Clinic Hotline as she is having migraines for the past 2 years. She was in the ED last night and was referred to clinic. Dr. Katrinka Blazing reviewed her chart and recommends that she be seen by neurology. Provided patient with phone number and address.

## 2017-08-19 NOTE — Discharge Instructions (Addendum)
You have been seen today for complaint of abdominal pain and headache. Abdominal pain: There were no acute abnormalities on the CT scan. Please follow-up with a primary care provider and the surgeon who performed the original surgery. Antiinflammatory medications: Take 600 mg of ibuprofen every 6 hours or 440 mg (over the counter dose) to 500 mg (prescription dose) of naproxen every 12 hours for the next 3 days. After this time, these medications may be used as needed for pain. Take these medications with food to avoid upset stomach. Choose only one of these medications, do not take them together. Tylenol: Should you continue to have additional pain while taking the ibuprofen or naproxen, you may add in tylenol as needed. Your daily total maximum amount of tylenol from all sources should be limited to /day for persons without liver problems, or /day for those with liver problems.  Headache: Be sure to stay well-hydrated. May use the above regimen for headache as well. May also try Fioricet instead of the Tylenol. May use Reglan as needed for nausea. May follow up with a primary care provider or with the concussion clinic on this matter.  Allergic reaction: Benadryl: Take 25 mg of Benadryl every 6 hours for the next 24 hours. He is cautioned as Benadryl can make you sleepy. Pepcid: Take the Pepcid as directed for the next 3 days. Prednisone: Take the prednisone, as directed. EpiPen: A prescription for an EpiPen and has been included. Please refer to the attached information regarding anaphylactic reaction to get a better understanding of when the EpiPen would be appropriate to use.

## 2017-08-19 NOTE — ED Notes (Signed)
ED Provider at bedside. 

## 2017-09-10 ENCOUNTER — Encounter (HOSPITAL_COMMUNITY): Payer: Self-pay | Admitting: *Deleted

## 2017-09-10 ENCOUNTER — Inpatient Hospital Stay (HOSPITAL_COMMUNITY)
Admission: EM | Admit: 2017-09-10 | Discharge: 2017-09-12 | DRG: 392 | Disposition: A | Discharge status: Home / Self Care | Payer: Medicaid Other | Attending: Internal Medicine | Admitting: Internal Medicine

## 2017-09-10 ENCOUNTER — Emergency Department (HOSPITAL_COMMUNITY): Payer: Medicaid Other

## 2017-09-10 DIAGNOSIS — F32A Depression, unspecified: Secondary | ICD-10-CM | POA: Insufficient documentation

## 2017-09-10 DIAGNOSIS — Z91018 Allergy to other foods: Secondary | ICD-10-CM | POA: Diagnosis not present

## 2017-09-10 DIAGNOSIS — K449 Diaphragmatic hernia without obstruction or gangrene: Secondary | ICD-10-CM | POA: Diagnosis present

## 2017-09-10 DIAGNOSIS — W19XXXA Unspecified fall, initial encounter: Secondary | ICD-10-CM | POA: Diagnosis present

## 2017-09-10 DIAGNOSIS — Z79899 Other long term (current) drug therapy: Secondary | ICD-10-CM

## 2017-09-10 DIAGNOSIS — Z91010 Allergy to peanuts: Secondary | ICD-10-CM

## 2017-09-10 DIAGNOSIS — R1011 Right upper quadrant pain: Secondary | ICD-10-CM | POA: Diagnosis present

## 2017-09-10 DIAGNOSIS — J45909 Unspecified asthma, uncomplicated: Secondary | ICD-10-CM | POA: Diagnosis present

## 2017-09-10 DIAGNOSIS — K920 Hematemesis: Secondary | ICD-10-CM | POA: Diagnosis present

## 2017-09-10 DIAGNOSIS — K2960 Other gastritis without bleeding: Secondary | ICD-10-CM | POA: Diagnosis present

## 2017-09-10 DIAGNOSIS — F4321 Adjustment disorder with depressed mood: Secondary | ICD-10-CM | POA: Diagnosis present

## 2017-09-10 DIAGNOSIS — K0889 Other specified disorders of teeth and supporting structures: Secondary | ICD-10-CM | POA: Diagnosis present

## 2017-09-10 DIAGNOSIS — Z91041 Radiographic dye allergy status: Secondary | ICD-10-CM

## 2017-09-10 DIAGNOSIS — Z87891 Personal history of nicotine dependence: Secondary | ICD-10-CM

## 2017-09-10 DIAGNOSIS — R519 Headache, unspecified: Secondary | ICD-10-CM | POA: Diagnosis present

## 2017-09-10 DIAGNOSIS — F329 Major depressive disorder, single episode, unspecified: Secondary | ICD-10-CM | POA: Insufficient documentation

## 2017-09-10 DIAGNOSIS — R1084 Generalized abdominal pain: Secondary | ICD-10-CM

## 2017-09-10 DIAGNOSIS — K2210 Ulcer of esophagus without bleeding: Secondary | ICD-10-CM | POA: Diagnosis present

## 2017-09-10 DIAGNOSIS — R51 Headache: Secondary | ICD-10-CM | POA: Diagnosis present

## 2017-09-10 DIAGNOSIS — K2900 Acute gastritis without bleeding: Secondary | ICD-10-CM | POA: Diagnosis present

## 2017-09-10 DIAGNOSIS — G43909 Migraine, unspecified, not intractable, without status migrainosus: Secondary | ICD-10-CM | POA: Diagnosis present

## 2017-09-10 LAB — COMPREHENSIVE METABOLIC PANEL
ALK PHOS: 60 U/L (ref 38–126)
ALT: 18 U/L (ref 14–54)
AST: 19 U/L (ref 15–41)
Albumin: 4.6 g/dL (ref 3.5–5.0)
Anion gap: 7 (ref 5–15)
BUN: 14 mg/dL (ref 6–20)
CALCIUM: 9.5 mg/dL (ref 8.9–10.3)
CO2: 27 mmol/L (ref 22–32)
CREATININE: 0.66 mg/dL (ref 0.44–1.00)
Chloride: 105 mmol/L (ref 101–111)
Glucose, Bld: 93 mg/dL (ref 65–99)
Potassium: 3.8 mmol/L (ref 3.5–5.1)
Sodium: 139 mmol/L (ref 135–145)
Total Bilirubin: 0.9 mg/dL (ref 0.3–1.2)
Total Protein: 8 g/dL (ref 6.5–8.1)

## 2017-09-10 LAB — I-STAT BETA HCG BLOOD, ED (MC, WL, AP ONLY)

## 2017-09-10 LAB — LIPASE, BLOOD: Lipase: 21 U/L (ref 11–51)

## 2017-09-10 LAB — URINALYSIS, ROUTINE W REFLEX MICROSCOPIC
BILIRUBIN URINE: NEGATIVE
GLUCOSE, UA: NEGATIVE mg/dL
HGB URINE DIPSTICK: NEGATIVE
Ketones, ur: NEGATIVE mg/dL
Leukocytes, UA: NEGATIVE
NITRITE: NEGATIVE
Protein, ur: NEGATIVE mg/dL
SPECIFIC GRAVITY, URINE: 1.008 (ref 1.005–1.030)
pH: 8 (ref 5.0–8.0)

## 2017-09-10 LAB — CBC
HCT: 37.7 % (ref 36.0–46.0)
Hemoglobin: 12.3 g/dL (ref 12.0–15.0)
MCH: 26.6 pg (ref 26.0–34.0)
MCHC: 32.6 g/dL (ref 30.0–36.0)
MCV: 81.4 fL (ref 78.0–100.0)
PLATELETS: 273 10*3/uL (ref 150–400)
RBC: 4.63 MIL/uL (ref 3.87–5.11)
RDW: 13.8 % (ref 11.5–15.5)
WBC: 7.6 10*3/uL (ref 4.0–10.5)

## 2017-09-10 LAB — POC OCCULT BLOOD, ED: Fecal Occult Bld: NEGATIVE

## 2017-09-10 LAB — HEMOGLOBIN AND HEMATOCRIT, BLOOD
HEMATOCRIT: 35.8 % — AB (ref 36.0–46.0)
HEMOGLOBIN: 11.6 g/dL — AB (ref 12.0–15.0)

## 2017-09-10 LAB — TROPONIN I: Troponin I: <0.03 ng/mL (ref <0.03)

## 2017-09-10 MED ORDER — PROMETHAZINE HCL 25 MG/ML IJ SOLN
12.5000 mg | Freq: Once | INTRAMUSCULAR | Status: AC
  Administered 2017-09-10: 12.5 mg via INTRAVENOUS
  Filled 2017-09-10: qty 1

## 2017-09-10 MED ORDER — SODIUM CHLORIDE 0.9 % IV BOLUS (SEPSIS)
1000.0000 mL | Freq: Once | INTRAVENOUS | Status: AC
  Administered 2017-09-10: 1000 mL via INTRAVENOUS

## 2017-09-10 MED ORDER — ACETAMINOPHEN 500 MG PO TABS
1000.0000 mg | ORAL_TABLET | Freq: Once | ORAL | Status: DC
  Filled 2017-09-10: qty 2

## 2017-09-10 MED ORDER — PROMETHAZINE HCL 25 MG/ML IJ SOLN
12.5000 mg | Freq: Once | INTRAMUSCULAR | Status: AC
  Administered 2017-09-11: 12.5 mg via INTRAVENOUS
  Filled 2017-09-10 (×2): qty 1

## 2017-09-10 MED ORDER — PANTOPRAZOLE SODIUM 40 MG IV SOLR
40.0000 mg | Freq: Once | INTRAVENOUS | Status: AC
  Administered 2017-09-10: 40 mg via INTRAVENOUS
  Filled 2017-09-10: qty 40

## 2017-09-10 MED ORDER — MORPHINE SULFATE (PF) 2 MG/ML IV SOLN
2.0000 mg | Freq: Once | INTRAVENOUS | Status: AC
  Administered 2017-09-10: 2 mg via INTRAVENOUS
  Filled 2017-09-10: qty 1

## 2017-09-10 MED ORDER — PANTOPRAZOLE SODIUM 40 MG PO TBEC: 40.0000 mg | DELAYED_RELEASE_TABLET | Freq: Every day | ORAL | 0 refills | Status: DC

## 2017-09-10 MED ORDER — GI COCKTAIL ~~LOC~~
30.0000 mL | Freq: Once | ORAL | Status: DC
  Filled 2017-09-10: qty 30

## 2017-09-10 MED ORDER — SUCRALFATE 1 G PO TABS: 1.0000 g | ORAL_TABLET | Freq: Three times a day (TID) | ORAL | 0 refills | Status: DC

## 2017-09-10 MED ORDER — PROCHLORPERAZINE EDISYLATE 5 MG/ML IJ SOLN: 10.0000 mg | Freq: Once | INTRAMUSCULAR | Status: DC

## 2017-09-10 MED ORDER — SODIUM CHLORIDE 0.9 % IV SOLN
INTRAVENOUS | Status: DC
  Administered 2017-09-11: 01:00:00 via INTRAVENOUS

## 2017-09-10 MED ORDER — MORPHINE SULFATE (PF) 4 MG/ML IV SOLN
3.0000 mg | Freq: Once | INTRAVENOUS | Status: AC
  Administered 2017-09-11: 3 mg via INTRAVENOUS
  Filled 2017-09-10: qty 1

## 2017-09-10 NOTE — Discharge Instructions (Signed)
Please do not take any ibuprofen, naproxen, Aleve, Advil, or other NSAIDS.  You may take tylenol for your pain.
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
## 2017-09-10 NOTE — ED Notes (Signed)
Patient transported to X-ray 

## 2017-09-10 NOTE — ED Triage Notes (Signed)
Pt complains of RUQ pain since hernia surgery earlier this year. Pt states pain has been getting worse and would like mesh removed. Pt has not seen her surgon since surgery. Pt was seen from same last month but states nothing was found to be wrong.  Pt is allergic to IV contrast

## 2017-09-10 NOTE — ED Notes (Addendum)
When RN and PA went in to discharge pt, pt put her mother on speakerphone. Mother was very upset stating it was "ridiculous that she was being discharged in all her pain".  Mother then proceeded to tell PA and RN that patient "passed out" today, which was new information to the PA and RN.  PA stated we would do a further workup
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
## 2017-09-11 ENCOUNTER — Encounter (HOSPITAL_COMMUNITY): Payer: Self-pay | Admitting: *Deleted

## 2017-09-11 ENCOUNTER — Inpatient Hospital Stay (HOSPITAL_COMMUNITY): Payer: Medicaid Other | Admitting: Anesthesiology

## 2017-09-11 ENCOUNTER — Encounter (HOSPITAL_COMMUNITY)
Admission: EM | Disposition: A | Discharge status: Home / Self Care | Payer: Self-pay | Source: Home / Self Care | Attending: Internal Medicine

## 2017-09-11 ENCOUNTER — Inpatient Hospital Stay (HOSPITAL_COMMUNITY): Payer: Medicaid Other

## 2017-09-11 DIAGNOSIS — R1084 Generalized abdominal pain: Secondary | ICD-10-CM

## 2017-09-11 DIAGNOSIS — K920 Hematemesis: Secondary | ICD-10-CM

## 2017-09-11 DIAGNOSIS — R51 Headache: Secondary | ICD-10-CM

## 2017-09-11 HISTORY — PX: ESOPHAGOGASTRODUODENOSCOPY (EGD) WITH PROPOFOL: SHX5813

## 2017-09-11 LAB — BASIC METABOLIC PANEL
ANION GAP: 5 (ref 5–15)
BUN: 10 mg/dL (ref 6–20)
CALCIUM: 8.8 mg/dL — AB (ref 8.9–10.3)
CO2: 23 mmol/L (ref 22–32)
CREATININE: 0.89 mg/dL (ref 0.44–1.00)
Chloride: 113 mmol/L — ABNORMAL HIGH (ref 101–111)
GLUCOSE: 83 mg/dL (ref 65–99)
Potassium: 3.7 mmol/L (ref 3.5–5.1)
Sodium: 141 mmol/L (ref 135–145)

## 2017-09-11 LAB — CBC
HCT: 34.6 % — ABNORMAL LOW (ref 36.0–46.0)
Hemoglobin: 11 g/dL — ABNORMAL LOW (ref 12.0–15.0)
MCH: 26.2 pg (ref 26.0–34.0)
MCHC: 31.8 g/dL (ref 30.0–36.0)
MCV: 82.4 fL (ref 78.0–100.0)
PLATELETS: 213 10*3/uL (ref 150–400)
RBC: 4.2 MIL/uL (ref 3.87–5.11)
RDW: 13.9 % (ref 11.5–15.5)
WBC: 5.3 10*3/uL (ref 4.0–10.5)

## 2017-09-11 LAB — HIV ANTIBODY (ROUTINE TESTING W REFLEX): HIV SCREEN 4TH GENERATION: NONREACTIVE

## 2017-09-11 SURGERY — ESOPHAGOGASTRODUODENOSCOPY (EGD) WITH PROPOFOL
Anesthesia: Monitor Anesthesia Care

## 2017-09-11 MED ORDER — LIDOCAINE 2% (20 MG/ML) 5 ML SYRINGE
INTRAMUSCULAR | Status: DC | PRN
  Administered 2017-09-11: 60 mg via INTRAVENOUS

## 2017-09-11 MED ORDER — PROPOFOL 500 MG/50ML IV EMUL
INTRAVENOUS | Status: DC | PRN
  Administered 2017-09-11: 150 ug/kg/min via INTRAVENOUS

## 2017-09-11 MED ORDER — LACTATED RINGERS IV SOLN
INTRAVENOUS | Status: DC
  Administered 2017-09-11: 10:00:00 via INTRAVENOUS

## 2017-09-11 MED ORDER — MORPHINE SULFATE (PF) 4 MG/ML IV SOLN
2.0000 mg | INTRAVENOUS | Status: DC | PRN
  Administered 2017-09-11 – 2017-09-12 (×5): 2 mg via INTRAVENOUS
  Filled 2017-09-11 (×5): qty 1

## 2017-09-11 MED ORDER — SODIUM CHLORIDE 0.9 % IV SOLN: INTRAVENOUS | Status: DC

## 2017-09-11 MED ORDER — PANTOPRAZOLE SODIUM 40 MG IV SOLR
40.0000 mg | INTRAVENOUS | Status: DC
  Administered 2017-09-12: 40 mg via INTRAVENOUS
  Filled 2017-09-11: qty 40

## 2017-09-11 MED ORDER — PROPOFOL 10 MG/ML IV BOLUS
INTRAVENOUS | Status: AC
  Filled 2017-09-11: qty 60

## 2017-09-11 MED ORDER — PROPOFOL 10 MG/ML IV BOLUS
INTRAVENOUS | Status: DC | PRN
  Administered 2017-09-11 (×2): 20 mg via INTRAVENOUS

## 2017-09-11 MED ORDER — PROMETHAZINE HCL 25 MG/ML IJ SOLN
12.5000 mg | INTRAMUSCULAR | Status: DC | PRN
  Administered 2017-09-12: 12.5 mg via INTRAVENOUS
  Filled 2017-09-11 (×2): qty 1

## 2017-09-11 MED ORDER — LIDOCAINE 2% (20 MG/ML) 5 ML SYRINGE
INTRAMUSCULAR | Status: AC
  Filled 2017-09-11: qty 5

## 2017-09-11 MED ORDER — PANTOPRAZOLE SODIUM 40 MG IV SOLR
40.0000 mg | Freq: Two times a day (BID) | INTRAVENOUS | Status: DC
  Administered 2017-09-11: 40 mg via INTRAVENOUS
  Filled 2017-09-11: qty 40

## 2017-09-11 MED ORDER — SODIUM CHLORIDE 0.9 % IV SOLN
INTRAVENOUS | Status: DC
  Administered 2017-09-11 (×2): via INTRAVENOUS

## 2017-09-11 SURGICAL SUPPLY — 14 items

## 2017-09-11 NOTE — Progress Notes (Signed)
Nutrition Brief Note  Patient identified on the Malnutrition Screening Tool (MST) Report  Wt Readings from Last 15 Encounters:  09/11/17 140 lb (63.5 kg)  12/26/16 129 lb (58.5 kg)  12/25/16 129 lb 6.4 oz (58.7 kg)  02/12/16 115 lb (52.2 kg)  12/24/15 115 lb (52.2 kg)  09/16/15 130 lb 6.4 oz (59.1 kg)  06/14/15 123 lb (55.8 kg)  03/03/15 123 lb (55.8 kg)  05/14/12 133 lb (60.3 kg) (60 %, Z= 0.25)*  02/02/12 131 lb 4 oz (59.5 kg) (58 %, Z= 0.20)*   * Growth percentiles are based on CDC 2-20 Years data.    Body mass index is 25.61 kg/m. Patient meets criteria for overweight based on current BMI. Pt reports a UBW of 140 lb and no unintentional wt loss. Records indicate pt has gained 11 lb since admission in January 2018.   Current diet order is clear liquid. Pt reports appetite was stable until 2 days ago when she began to experience abdominal pain, but has not had any significant changes in appetite for prolonged period. Pt typically consumes three meals with snacks daily.   Labs and medications reviewed.   No nutrition interventions warranted at this time. If nutrition issues arise, please consult RD.   Vanessa Kickarly Urban Naval RD, LDN Clinical Nutrition Pager # [phone]

## 2017-09-11 NOTE — Anesthesia Preprocedure Evaluation (Signed)
Anesthesia Evaluation  Patient identified by MRN, date of birth, ID band Patient awake    Reviewed: Allergy & Precautions, NPO status , Patient's Chart, lab work & pertinent test results  Airway Mallampati: II  TM Distance: >3 FB Neck ROM: Full    Dental  (+) Dental Advisory Given   Pulmonary asthma , former smoker,    breath sounds clear to auscultation       Cardiovascular negative cardio ROS   Rhythm:Regular Rate:Normal     Neuro/Psych Depression negative neurological ROS     GI/Hepatic Neg liver ROS, GI bleed   Endo/Other  negative endocrine ROS  Renal/GU negative Renal ROS     Musculoskeletal negative musculoskeletal ROS (+)   Abdominal   Peds  Hematology  (+) anemia ,   Anesthesia Other Findings   Reproductive/Obstetrics                             Anesthesia Physical Anesthesia Plan  ASA: II  Anesthesia Plan: MAC   Post-op Pain Management:    Induction: Intravenous  PONV Risk Score and Plan: 2 and Ondansetron, Propofol infusion and Treatment may vary due to age or medical condition  Airway Management Planned: Natural Airway and Nasal Cannula  Additional Equipment:   Intra-op Plan:   Post-operative Plan:   Informed Consent: I have reviewed the patients History and Physical, chart, labs and discussed the procedure including the risks, benefits and alternatives for the proposed anesthesia with the patient or authorized representative who has indicated his/her understanding and acceptance.     Plan Discussed with: CRNA  Anesthesia Plan Comments:         Anesthesia Quick Evaluation

## 2017-09-11 NOTE — Op Note (Signed)
[facility] Patient Name: [name] [name] Procedure Date: 09/11/2017 MRN: [id number] Attending MD: [name] [name] , MD Date of Birth: 11/24/1991 CSN: [id number] Age: 26 Admit Type: Inpatient Procedure:                Upper GI endoscopy Indications:              Suspected upper gastrointestinal bleeding,                            Hematemesis Providers:                [name] Friar, MD, Dwain Sarna, RN, Margo Aye, Technician Referring MD:             hospital team Medicines:                Propofol per Anesthesia, Monitored Anesthesia Care Complications:            No immediate complications. Estimated Blood Loss:     Estimated blood loss: none. Procedure:                Pre-Anesthesia Assessment:                           - Prior to the procedure, a History and Physical                            was performed, and patient medications and                            allergies were reviewed. The patient's tolerance of                            previous anesthesia was also reviewed. The risks                            and benefits of the procedure and the sedation                            options and risks were discussed with the patient.                            All questions were answered, and informed consent                            was obtained. Prior Anticoagulants: The patient has                            taken no previous anticoagulant or antiplatelet                            agents. ASA Grade Assessment: II - A patient with  mild systemic disease. After reviewing the risks                            and benefits, the patient was deemed in                            satisfactory condition to undergo the procedure.                           After obtaining informed consent, the endoscope was                            passed under direct vision. Throughout the      procedure, the patient's blood pressure, pulse, and                            oxygen saturations were monitored continuously. The                            EG-[id number] ([id number]) scope was introduced through the                            mouth, and advanced to the second part of duodenum.                            The upper GI endoscopy was accomplished without                            difficulty. The patient tolerated the procedure                            well. Scope In: Scope Out: Findings:      The examined esophagus was normal.      The Z-line was regular and was found 36 cm from the incisors.      Segmental minimal inflammation characterized by congestion (edema) and       erythema was found in the gastric antrum.      A small hiatal hernia was present.      The examined duodenum was normal. Impression:               - Normal esophagus.                           - Z-line regular, 36 cm from the incisors.                           - Acute gastritis.                           - Small hiatal hernia.                           - Normal examined duodenum.                           - No specimens collected.  Moderate Sedation:      N/A- Per Anesthesia Care Recommendation:           - Advance diet as tolerated and clear liquid diet.                           - Observe patient's clinical course.                           - Post procedure medication orders were given. Procedure Code(s):        --- Professional ---                           [phone], Esophagogastroduodenoscopy, flexible,                            transoral; diagnostic, including collection of                            specimen(s) by brushing or washing, when performed                            (separate procedure) Diagnosis Code(s):        --- Professional ---                           K92.0, Hematemesis                           K29.00, Acute gastritis without bleeding                           K44.9,  Diaphragmatic hernia without obstruction or                            gangrene CPT copyright 2016 [employer]. All rights reserved. The codes documented in this report are preliminary and upon coder review may  be revised to meet current compliance requirements. [name] FriarVincent C Dixie Coppa, MD 09/11/2017 11:55:44 AM This report has been signed electronically. Number of Addenda: 0

## 2017-09-11 NOTE — Anesthesia Procedure Notes (Signed)
Procedure Name: MAC Date/Time: 09/11/2017 11:34 AM Performed by: Dione Booze Pre-anesthesia Checklist: Patient identified, Emergency Drugs available, Suction available and Patient being monitored Patient Re-evaluated:Patient Re-evaluated prior to induction Oxygen Delivery Method: Nasal cannula Placement Confirmation: positive ETCO2

## 2017-09-11 NOTE — Care Management Note (Signed)
Case Management Note  Patient Details  Name: [name] [name] Rask MRN: [id number] Date of Birth: 1990/12/22  Subjective/Objective:                  Abd. pain  Action/Plan: Date:  September 11, 2017 Chart reviewed for concurrent status and case management needs.  Will continue to follow patient progress.  Discharge Planning: following for needs  Expected discharge date: [id number]  Marcelle SmilingRhonda Davis, BSN, [place]RN3, [place]M   [phone]   Expected Discharge Date:  09/13/17               Expected Discharge Plan:  Home/Self Care  In-House Referral:     Discharge planning Services  CM Consult  Post Acute Care Choice:    Choice offered to:     DME Arranged:    DME Agency:     HH Arranged:    HH Agency:     Status of Service:  In process, will continue to follow  If discussed at Long Length of Stay Meetings, dates discussed:    Additional Comments:  Golda AcreDavis, Rhonda Lynn, RN 09/11/2017, 10:05 AM

## 2017-09-11 NOTE — Transfer of Care (Signed)
Immediate Anesthesia Transfer of Care Note  Patient: [name] [name]  Procedure(s) Performed: ESOPHAGOGASTRODUODENOSCOPY (EGD) WITH PROPOFOL (N/A )  Patient Location: PACU and Endoscopy Unit  Anesthesia Type:MAC  Level of Consciousness: awake and patient cooperative  Airway & Oxygen Therapy: Patient Spontanous Breathing and Patient connected to nasal cannula oxygen  Post-op Assessment: Report given to RN and Post -op Vital signs reviewed and stable  Post vital signs: Reviewed and stable  Last Vitals:  Vitals:   09/11/17 0557 09/11/17 1025  BP: 113/69 115/80  Pulse: 73 75  Resp: 18 12  Temp: 36.8 C 36.9 C  SpO2: 100% 98%    Last Pain:  Vitals:   09/11/17 1025  TempSrc: Oral  PainSc: 10-Worst pain ever      Patients Stated Pain Goal: 0 (20/94/70 9628)  Complications: No apparent anesthesia complications

## 2017-09-11 NOTE — Progress Notes (Signed)
Pt arrived to unit room 1521 via stretcher. VS taken, pt oriented to room and callbell with no complications. General weakness pain 10/10 alert and oriented x 4. initial assessment completed. Will continue to monitor

## 2017-09-11 NOTE — Interval H&P Note (Signed)
History and Physical Interval Note:  09/11/2017 11:30 AM  [name] [name]  has presented today for surgery, with the diagnosis of gi bleed  The various methods of treatment have been discussed with the patient and family. After consideration of risks, benefits and other options for treatment, the patient has consented to  Procedure(s): ESOPHAGOGASTRODUODENOSCOPY (EGD) WITH PROPOFOL (N/A) as a surgical intervention .  The patient's history has been reviewed, patient examined, no change in status, stable for surgery.  I have reviewed the patient's chart and labs.  Questions were answered to the patient's satisfaction.     Jaci Desanto C.

## 2017-09-11 NOTE — Brief Op Note (Signed)
Minimal antral gastritis and small hiatal hernia. No source of hematemesis seen. Suspect oropharyngeal irritation from vomiting. Clear liquid diet and advance as tolerated. Negative abd CT last month. Needs to f/u with [facility] as outpt due to abdominal pain since hernia surgery that she attributes to the mesh that was placed. If she can tolerate diet advancement, then ok to go home tomorrow. F/U with GI prn. Will sign off. Call if questions.
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
## 2017-09-11 NOTE — Anesthesia Postprocedure Evaluation (Signed)
Anesthesia Post Note  Patient: [name] [name]  Procedure(s) Performed: ESOPHAGOGASTRODUODENOSCOPY (EGD) WITH PROPOFOL (N/A )     Patient location during evaluation: PACU Anesthesia Type: MAC Level of consciousness: awake and alert Pain management: pain level controlled Vital Signs Assessment: post-procedure vital signs reviewed and stable Respiratory status: spontaneous breathing, nonlabored ventilation, respiratory function stable and patient connected to nasal cannula oxygen Cardiovascular status: stable and blood pressure returned to baseline Postop Assessment: no apparent nausea or vomiting Anesthetic complications: no    Last Vitals:  Vitals:   09/11/17 1200 09/11/17 1210  BP: 114/78 122/74  Pulse: 70 75  Resp: 14 17  Temp:    SpO2: 100% 100%    Last Pain:  Vitals:   09/11/17 1155  TempSrc: Oral  PainSc:                  Tiajuana Amass
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
## 2017-09-12 ENCOUNTER — Encounter (HOSPITAL_COMMUNITY): Payer: Self-pay | Admitting: Gastroenterology

## 2017-09-12 DIAGNOSIS — K2900 Acute gastritis without bleeding: Principal | ICD-10-CM

## 2017-09-12 LAB — CBC
HCT: 35.2 % — ABNORMAL LOW (ref 36.0–46.0)
Hemoglobin: 11.3 g/dL — ABNORMAL LOW (ref 12.0–15.0)
MCH: 26.5 pg (ref 26.0–34.0)
MCHC: 32.1 g/dL (ref 30.0–36.0)
MCV: 82.4 fL (ref 78.0–100.0)
PLATELETS: 213 10*3/uL (ref 150–400)
RBC: 4.27 MIL/uL (ref 3.87–5.11)
RDW: 13.6 % (ref 11.5–15.5)
WBC: 5 10*3/uL (ref 4.0–10.5)

## 2017-09-12 LAB — BASIC METABOLIC PANEL
ANION GAP: 7 (ref 5–15)
BUN: 14 mg/dL (ref 6–20)
CALCIUM: 8.8 mg/dL — AB (ref 8.9–10.3)
CO2: 24 mmol/L (ref 22–32)
Chloride: 108 mmol/L (ref 101–111)
Creatinine, Ser: 0.84 mg/dL (ref 0.44–1.00)
GFR calc Af Amer: >60 mL/min (ref >60)
Glucose, Bld: 84 mg/dL (ref 65–99)
POTASSIUM: 3.5 mmol/L (ref 3.5–5.1)
SODIUM: 139 mmol/L (ref 135–145)

## 2017-09-12 MED ORDER — OXYCODONE-ACETAMINOPHEN 5-325 MG PO TABS
1.0000 | ORAL_TABLET | ORAL | Status: DC | PRN
  Administered 2017-09-12: 1 via ORAL
  Filled 2017-09-12: qty 1

## 2017-09-12 MED ORDER — MORPHINE SULFATE (PF) 4 MG/ML IV SOLN
2.0000 mg | INTRAVENOUS | Status: DC | PRN
  Administered 2017-09-12: 2 mg via INTRAVENOUS
  Filled 2017-09-12: qty 1

## 2017-09-12 MED ORDER — OXYCODONE-ACETAMINOPHEN 5-325 MG PO TABS: 1.0000 | ORAL_TABLET | Freq: Four times a day (QID) | ORAL | 0 refills | Status: DC | PRN

## 2017-09-12 MED ORDER — FAMOTIDINE 20 MG PO TABS: 20.0000 mg | ORAL_TABLET | Freq: Two times a day (BID) | ORAL | 0 refills | Status: DC

## 2017-09-12 NOTE — Progress Notes (Signed)
IV removed. DC instructions reviewed,prescriptions provided, patient expresses understanding. Assisted to ride at main entrance.

## 2017-09-12 NOTE — Progress Notes (Signed)
September 12, 2017 Chart and discharge orders researched for Case Management needs. None found and patient discharged to appropriate level of care. Patient and family have no further questions. Rhonda Davis, BSN, RN3, CCM [phone]. 
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
## 2017-09-12 NOTE — Progress Notes (Signed)
1 Day Post-Op    CC:  Complains of headache and abdominal pain  Subjective: Complaints the same this AM; headache and abdominal pain.  Tolerated french toast this AM, No BM x 4 days.    Objective: Vital signs in last 24 hours: Temp:  [98.2 F (36.8 C)-99.7 F (37.6 C)] 99 F (37.2 C) (10/18 0430) Pulse Rate:  [61-75] 69 (10/18 0430) Resp:  [14-17] 16 (10/18 0430) BP: (106-122)/(62-78) 106/62 (10/18 0430) SpO2:  [98 %-100 %] 98 % (10/18 0430) Last BM Date: 09/11/17 120 PO 2400 IV Emesis x 1 Afebrile, VSS Labs OK  Intake/Output from previous day: 10/17 0701 - 10/18 0700 In: 2366.7 [P.O.:120; I.V.:2246.7] Out: 1 [Emesis/NG output:1] Intake/Output this shift: No intake/output data recorded.  General appearance: alert, cooperative and no distress GI: soft, complained of some tenderness RUQ this AM with exam. Abdomen is soft, few BS, no distension, no rebound.   Lab Results:   Recent Labs  09/11/17 0643 09/12/17 0716  WBC 5.3 5.0  HGB 11.0* 11.3*  HCT 34.6* 35.2*  PLT 213 213    BMET  Recent Labs  09/11/17 0643 09/12/17 0716  NA 141 139  K 3.7 3.5  CL 113* 108  CO2 23 24  GLUCOSE 83 84  BUN 10 14  CREATININE 0.89 0.84  CALCIUM 8.8* 8.8*   PT/INR No results for input(s): LABPROT, INR in the last 72 hours.   Recent Labs Lab 09/10/17 1344  AST 19  ALT 18  ALKPHOS 60  BILITOT 0.9  PROT 8.0  ALBUMIN 4.6     Lipase     Component Value Date/Time   LIPASE 21 09/10/2017 1344     Medications: . pantoprazole (PROTONIX) IV  40 mg Intravenous Q24H   . sodium chloride 50 mL/hr at 09/11/17 1531    Assessment/Plan Abdominal pain/hematemesis S/p umbilical hernia repair with 4 x 3 cm Bard Mesh 12/26/16 Small hiatal hernia and antral gastritis Daily Migraine headaches  Situational depression   FEN:  IV fluids/regular diet ID:  None DVT:  SCD's  Plan:  I do not see any clinical signs of issues with the mesh or a reason for her pain aside from  those noted yesterday.     LOS: 2 days    JENNINGS,WILLARD 09/12/2017 [phone]  Agree with above. Difficult patient, but she has no obvious surgical issue.  Ovidio Kinavid Barlow Harrison, MD, [facility] Pager: [phone] Office phone:  [phone]

## 2018-01-08 ENCOUNTER — Emergency Department (HOSPITAL_COMMUNITY): Payer: Medicaid Other

## 2018-01-08 ENCOUNTER — Emergency Department (HOSPITAL_COMMUNITY)
Admission: EM | Admit: 2018-01-08 | Discharge: 2018-01-08 | Disposition: A | Discharge status: Home / Self Care | Payer: Medicaid Other | Attending: Emergency Medicine | Admitting: Emergency Medicine

## 2018-01-08 ENCOUNTER — Other Ambulatory Visit: Payer: Self-pay

## 2018-01-08 DIAGNOSIS — G8929 Other chronic pain: Secondary | ICD-10-CM | POA: Diagnosis not present

## 2018-01-08 DIAGNOSIS — R109 Unspecified abdominal pain: Secondary | ICD-10-CM

## 2018-01-08 DIAGNOSIS — R112 Nausea with vomiting, unspecified: Secondary | ICD-10-CM | POA: Diagnosis not present

## 2018-01-08 DIAGNOSIS — R1011 Right upper quadrant pain: Secondary | ICD-10-CM | POA: Insufficient documentation

## 2018-01-08 DIAGNOSIS — R197 Diarrhea, unspecified: Secondary | ICD-10-CM | POA: Insufficient documentation

## 2018-01-08 DIAGNOSIS — Z87891 Personal history of nicotine dependence: Secondary | ICD-10-CM | POA: Diagnosis not present

## 2018-01-08 LAB — I-STAT BETA HCG BLOOD, ED (MC, WL, AP ONLY): I-stat hCG, quantitative: <5 m[IU]/mL (ref <5)

## 2018-01-08 LAB — COMPREHENSIVE METABOLIC PANEL
ALBUMIN: 4.4 g/dL (ref 3.5–5.0)
ALK PHOS: 65 U/L (ref 38–126)
ALT: 17 U/L (ref 14–54)
ANION GAP: 13 (ref 5–15)
AST: 24 U/L (ref 15–41)
BILIRUBIN TOTAL: 0.8 mg/dL (ref 0.3–1.2)
BUN: 16 mg/dL (ref 6–20)
CALCIUM: 9.6 mg/dL (ref 8.9–10.3)
CO2: 24 mmol/L (ref 22–32)
Chloride: 107 mmol/L (ref 101–111)
Creatinine, Ser: 0.81 mg/dL (ref 0.44–1.00)
GFR calc Af Amer: >60 mL/min (ref >60)
GFR calc non Af Amer: >60 mL/min (ref >60)
GLUCOSE: 92 mg/dL (ref 65–99)
Potassium: 3.9 mmol/L (ref 3.5–5.1)
Sodium: 144 mmol/L (ref 135–145)
TOTAL PROTEIN: 8 g/dL (ref 6.5–8.1)

## 2018-01-08 LAB — RAPID URINE DRUG SCREEN, HOSP PERFORMED
Amphetamines: NOT DETECTED
Barbiturates: NOT DETECTED
Benzodiazepines: NOT DETECTED
COCAINE: NOT DETECTED
Opiates: NOT DETECTED
Tetrahydrocannabinol: NOT DETECTED

## 2018-01-08 LAB — CBC WITH DIFFERENTIAL/PLATELET
BASOS PCT: 0 %
Basophils Absolute: 0 10*3/uL (ref 0.0–0.1)
EOS ABS: 0 10*3/uL (ref 0.0–0.7)
Eosinophils Relative: 0 %
HEMATOCRIT: 39.8 % (ref 36.0–46.0)
HEMOGLOBIN: 13.1 g/dL (ref 12.0–15.0)
Lymphocytes Relative: 21 %
Lymphs Abs: 1.9 10*3/uL (ref 0.7–4.0)
MCH: 26.7 pg (ref 26.0–34.0)
MCHC: 32.9 g/dL (ref 30.0–36.0)
MCV: 81.1 fL (ref 78.0–100.0)
MONO ABS: 0.6 10*3/uL (ref 0.1–1.0)
MONOS PCT: 6 %
NEUTROS PCT: 73 %
Neutro Abs: 6.5 10*3/uL (ref 1.7–7.7)
Platelets: 301 10*3/uL (ref 150–400)
RBC: 4.91 MIL/uL (ref 3.87–5.11)
RDW: 13.7 % (ref 11.5–15.5)
WBC: 9 10*3/uL (ref 4.0–10.5)

## 2018-01-08 LAB — URINALYSIS, ROUTINE W REFLEX MICROSCOPIC
BILIRUBIN URINE: NEGATIVE
Glucose, UA: NEGATIVE mg/dL
Hgb urine dipstick: NEGATIVE
KETONES UR: 5 mg/dL — AB
Nitrite: NEGATIVE
PROTEIN: 30 mg/dL — AB
Specific Gravity, Urine: 1.026 (ref 1.005–1.030)
pH: 7 (ref 5.0–8.0)

## 2018-01-08 LAB — ETHANOL: Alcohol, Ethyl (B): <10 mg/dL (ref <10)

## 2018-01-08 LAB — LIPASE, BLOOD: Lipase: 17 U/L (ref 11–51)

## 2018-01-08 MED ORDER — GI COCKTAIL ~~LOC~~
30.0000 mL | Freq: Once | ORAL | Status: AC
  Administered 2018-01-08: 30 mL via ORAL
  Filled 2018-01-08: qty 30

## 2018-01-08 MED ORDER — LIDOCAINE VISCOUS 2 % MT SOLN: 15.0000 mL | OROMUCOSAL | 2 refills | Status: DC | PRN

## 2018-01-08 MED ORDER — SODIUM CHLORIDE 0.9 % IV BOLUS (SEPSIS)
1000.0000 mL | Freq: Once | INTRAVENOUS | Status: AC
  Administered 2018-01-08: 1000 mL via INTRAVENOUS

## 2018-01-08 MED ORDER — ONDANSETRON HCL 4 MG/2ML IJ SOLN
4.0000 mg | Freq: Once | INTRAMUSCULAR | Status: AC
  Administered 2018-01-08: 4 mg via INTRAVENOUS
  Filled 2018-01-08: qty 2

## 2018-01-08 MED ORDER — FAMOTIDINE IN NACL 20-0.9 MG/50ML-% IV SOLN
20.0000 mg | Freq: Once | INTRAVENOUS | Status: AC
  Administered 2018-01-08: 20 mg via INTRAVENOUS
  Filled 2018-01-08: qty 50

## 2018-01-08 MED ORDER — HYDROXYZINE HCL 25 MG PO TABS: 25.0000 mg | ORAL_TABLET | Freq: Four times a day (QID) | ORAL | 0 refills | Status: DC

## 2018-01-08 MED ORDER — PANTOPRAZOLE SODIUM 20 MG PO TBEC: 20.0000 mg | DELAYED_RELEASE_TABLET | Freq: Every day | ORAL | 1 refills | Status: DC

## 2018-01-08 MED ORDER — ONDANSETRON 4 MG PO TBDP: 4.0000 mg | ORAL_TABLET | Freq: Three times a day (TID) | ORAL | 0 refills | Status: DC | PRN

## 2018-01-08 MED ORDER — MORPHINE SULFATE (PF) 4 MG/ML IV SOLN
4.0000 mg | Freq: Once | INTRAVENOUS | Status: AC
  Administered 2018-01-08: 4 mg via INTRAVENOUS
  Filled 2018-01-08: qty 1

## 2018-01-08 MED ORDER — PANTOPRAZOLE SODIUM 40 MG IV SOLR
40.0000 mg | Freq: Once | INTRAVENOUS | Status: AC
  Administered 2018-01-08: 40 mg via INTRAVENOUS
  Filled 2018-01-08: qty 40

## 2018-01-08 MED ORDER — TRAMADOL HCL 50 MG PO TABS: 50.0000 mg | ORAL_TABLET | Freq: Four times a day (QID) | ORAL | 0 refills | Status: DC | PRN

## 2018-01-08 MED ORDER — LORAZEPAM 2 MG/ML IJ SOLN
1.0000 mg | Freq: Once | INTRAMUSCULAR | Status: AC
  Administered 2018-01-08: 1 mg via INTRAVENOUS
  Filled 2018-01-08: qty 1

## 2018-01-08 MED ORDER — CAPSAICIN 0.025 % EX CREA
TOPICAL_CREAM | Freq: Once | CUTANEOUS | Status: AC
  Administered 2018-01-08: 17:00:00 via TOPICAL
  Filled 2018-01-08: qty 60

## 2018-01-08 MED ORDER — KETOROLAC TROMETHAMINE 30 MG/ML IJ SOLN
30.0000 mg | Freq: Once | INTRAMUSCULAR | Status: AC
  Administered 2018-01-08: 30 mg via INTRAVENOUS
  Filled 2018-01-08: qty 1

## 2018-01-08 MED ORDER — GABAPENTIN 300 MG PO CAPS: 300.0000 mg | ORAL_CAPSULE | Freq: Two times a day (BID) | ORAL | 0 refills | Status: DC

## 2018-01-08 MED ORDER — LIDOCAINE VISCOUS 2 % MT SOLN
15.0000 mL | OROMUCOSAL | 2 refills | Status: DC | PRN
Start: 2018-01-08 — End: 2018-01-08

## 2018-01-08 MED ORDER — FAMOTIDINE 20 MG PO TABS: 20.0000 mg | ORAL_TABLET | Freq: Two times a day (BID) | ORAL | 0 refills | Status: DC

## 2018-01-08 NOTE — Discharge Instructions (Addendum)
Your lab results today were encouraging.  Take Tramadol for pain. Take Protonix for pain. This is an acid reducer  You may try the hydroxyzine to help with anxiety and sleep. Please follow up with a primary doctor and GI   Due to your previously diagnosed gastritis and your repeated instances of vomiting, you should follow the below guidelines.  Diet: Please adhere to the enclosed dietary suggestions.  In general, avoid NSAIDs (i.e. ibuprofen, naproxen, etc.), caffeine, alcohol, spicy foods, fatty foods, or any other foods that seem to cause your symptoms to arise.  Protonix: Take this medication daily, 20-30 minutes prior to your first meal, for the next 8 weeks.  Continue to take this medication even if you begin to feel better.   Follow-up: Please follow-up with your primary care provider on this matter.  Return: Return to the ED for significantly worsening symptoms, persistent vomiting, persistent fever, vomiting blood, blood in the stools, dark stools, or any other major concerns.  Dental Pain You have been seen today for dental pain. You should follow up with a dentist as soon as possible. This problem will not resolve on its own without the care of a dentist. Use ibuprofen or naproxen for pain. Use the viscous lidocaine for mouth pain. Swish with the lidocaine and spit it out. Do not swallow it.  Dental Resource Guide  [facility] [address], [address] [place], [place] [address] [phone]  [facility] [place] [place] [address] [place], [place] [address] [phone]  [facility] [address] [place], [place] [address] [phone] ext. [phone]  [facility] [address] [place], [place] [address] [phone]  [facility] [address] [place], [place] [address] [phone]  [facility] [email or web address]  [employer]  [employer] [address] [facility] [place], [place] [address] [phone]  Website for free, low-income, or sliding scale dental services in [place]: www.freedental.us  To find a dentist in [place] and surrounding areas: [email or web address]  [facility] [email or web address]-missions-[email or web address]-mercy  [place] Medicaid Dentist http://[email or web address]/find-[email or web address]/[email or web address]

## 2018-01-08 NOTE — ED Provider Notes (Signed)
Patient signed out to me by S Joy PA-C. She is a 27 year old with hx of chronic abdominal pain, hiatal hernia, hx of gastritis. The pain is similar to prior episodes. What brought them here today is that the patient wasn't talking to her significant other so EMS was called. Vitals are normal. Labs are normal. UDS is negative. She was given GI cocktail, Ativan, fluids, [place], Toradol with minimal relief. Will try Pepcid and Protonix  7:33 PM She still doesn't feel better. Will order RUQ US and give Morphine and reassess.  Pain is still not better. RUQ US is negative. Shared visit with Dr. Estell HarpinZammit. Extensive amount of time was spent talking with the patient and her family. They are upset that they are not getting answers about her chronic pain. Encouraged f/u with PCP and GI. Rx for Zofran, Protonix, Tramadol, and Lidocaine for mouth pain were given.    [name] [name], [name] Hobbins Marie, PA-C 01/08/18 2336    Bethann BerkshireZammit, Joseph, MD 01/09/18 [phone]

## 2018-01-08 NOTE — ED Triage Notes (Signed)
Per EMS, patient comes from work. She started having a panic attack. Last couple weeks has has personal stuff, death in family ect. Pt also c/o abdominal pain and chest pain under L breast- only with palpation. Pt had a hernia surgery 1 year and has had on and off pain in the same spot. Has been in multiple times for the pain. Pt hyperventilated to the point of syncope with EMS. 12lead unremarkable. VSS

## 2018-01-08 NOTE — ED Notes (Addendum)
Pt stated that the pain medicine did not help at all. She is c/o a migraine that is throbbing also.  She stated that the cream placed on her stomach earlier is still burning, instructed to wipe her stomach with a wet rag to see if that will help. MD made aware.
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
## 2018-01-08 NOTE — ED Notes (Signed)
Bed: [id number] Expected date:  Expected time:  Means of arrival:  Comments: EMS/abd. Pain/syncope

## 2018-01-08 NOTE — Care Management Note (Signed)
Case Management Note  CM consulted for no pcp and no ins.  CM placed the 3 clinic options on AVS for pt choice to call and schedule an appointment.  Information additionally provided about pharmacy and financial counseling.  CM will [facility] possible future [facility].  Updated EDPA.  No further CM needs note at this time.

## 2018-01-08 NOTE — ED Notes (Signed)
Pt aware urine sample is needed 

## 2018-01-08 NOTE — ED Notes (Signed)
PA at bedside.

## 2018-01-09 ENCOUNTER — Telehealth: Payer: Self-pay

## 2018-01-09 NOTE — Telephone Encounter (Signed)
Message received from Eldridge AbrahamsAngela Kritzer, RN CM requesting a hospital follow [facility]. Informed her that there are not any appointments available right now. The patient has the contact # [facility], [facility] and [facility] on her [facility]. She can call to check availability.

## 2018-01-13 ENCOUNTER — Telehealth: Payer: Self-pay

## 2018-01-13 NOTE — Telephone Encounter (Signed)
Attempted to contact the patient to discuss scheduling a [facility] as an appointment is available on 01/22/18.   Call placed to # [phone] (M) and a HIPAA compliant voicemail message was left requesting a call back to # [phone].

## 2018-01-17 ENCOUNTER — Telehealth: Payer: Self-pay | Admitting: General Practice

## 2018-01-17 NOTE — Telephone Encounter (Signed)
Call placed to patient #[phone], regarding scheduling a hospital follow up appointment and establishing care with us, [facility]. No answer. Left patient a message asking her to return my call at [phone].

## 2018-01-22 ENCOUNTER — Encounter (HOSPITAL_COMMUNITY): Payer: Self-pay | Admitting: Surgery

## 2018-01-22 ENCOUNTER — Telehealth: Payer: Self-pay

## 2018-01-22 NOTE — Telephone Encounter (Signed)
Letter sent to patient informing her that the clinic has not been able to reach her to schedule an appointment. Clinic number provider for her to call to schedule an appointment.

## 2018-02-18 ENCOUNTER — Encounter (HOSPITAL_COMMUNITY): Payer: Self-pay

## 2018-02-18 ENCOUNTER — Emergency Department (HOSPITAL_COMMUNITY): Payer: Medicaid Other

## 2018-02-18 ENCOUNTER — Inpatient Hospital Stay (HOSPITAL_COMMUNITY)
Admission: EM | Admit: 2018-02-18 | Discharge: 2018-02-21 | DRG: 392 | Disposition: A | Discharge status: Home / Self Care | Payer: Medicaid Other | Attending: Internal Medicine | Admitting: Internal Medicine

## 2018-02-18 ENCOUNTER — Other Ambulatory Visit: Payer: Self-pay

## 2018-02-18 DIAGNOSIS — D649 Anemia, unspecified: Secondary | ICD-10-CM | POA: Diagnosis present

## 2018-02-18 DIAGNOSIS — K209 Esophagitis, unspecified: Secondary | ICD-10-CM | POA: Diagnosis present

## 2018-02-18 DIAGNOSIS — R1033 Periumbilical pain: Secondary | ICD-10-CM | POA: Diagnosis present

## 2018-02-18 DIAGNOSIS — J45909 Unspecified asthma, uncomplicated: Secondary | ICD-10-CM | POA: Diagnosis present

## 2018-02-18 DIAGNOSIS — K920 Hematemesis: Secondary | ICD-10-CM | POA: Diagnosis not present

## 2018-02-18 DIAGNOSIS — R1013 Epigastric pain: Secondary | ICD-10-CM | POA: Diagnosis not present

## 2018-02-18 DIAGNOSIS — R1084 Generalized abdominal pain: Secondary | ICD-10-CM | POA: Diagnosis present

## 2018-02-18 DIAGNOSIS — Z79899 Other long term (current) drug therapy: Secondary | ICD-10-CM

## 2018-02-18 DIAGNOSIS — G8929 Other chronic pain: Secondary | ICD-10-CM | POA: Diagnosis present

## 2018-02-18 DIAGNOSIS — Z91010 Allergy to peanuts: Secondary | ICD-10-CM

## 2018-02-18 DIAGNOSIS — Z91041 Radiographic dye allergy status: Secondary | ICD-10-CM

## 2018-02-18 DIAGNOSIS — R112 Nausea with vomiting, unspecified: Secondary | ICD-10-CM | POA: Diagnosis present

## 2018-02-18 DIAGNOSIS — R109 Unspecified abdominal pain: Secondary | ICD-10-CM | POA: Diagnosis present

## 2018-02-18 DIAGNOSIS — K2970 Gastritis, unspecified, without bleeding: Secondary | ICD-10-CM

## 2018-02-18 DIAGNOSIS — Z888 Allergy status to other drugs, medicaments and biological substances status: Secondary | ICD-10-CM

## 2018-02-18 DIAGNOSIS — E876 Hypokalemia: Secondary | ICD-10-CM | POA: Diagnosis present

## 2018-02-18 DIAGNOSIS — K2990 Gastroduodenitis, unspecified, without bleeding: Secondary | ICD-10-CM | POA: Diagnosis present

## 2018-02-18 DIAGNOSIS — Z91018 Allergy to other foods: Secondary | ICD-10-CM

## 2018-02-18 DIAGNOSIS — Z87891 Personal history of nicotine dependence: Secondary | ICD-10-CM

## 2018-02-18 LAB — COMPREHENSIVE METABOLIC PANEL
ALBUMIN: 4 g/dL (ref 3.5–5.0)
ALT: 16 U/L (ref 14–54)
AST: 19 U/L (ref 15–41)
Alkaline Phosphatase: 71 U/L (ref 38–126)
Anion gap: 7 (ref 5–15)
BUN: 14 mg/dL (ref 6–20)
CHLORIDE: 106 mmol/L (ref 101–111)
CO2: 26 mmol/L (ref 22–32)
Calcium: 9.4 mg/dL (ref 8.9–10.3)
Creatinine, Ser: 0.64 mg/dL (ref 0.44–1.00)
GFR calc Af Amer: >60 mL/min (ref >60)
GFR calc non Af Amer: >60 mL/min (ref >60)
Glucose, Bld: 94 mg/dL (ref 65–99)
POTASSIUM: 4.1 mmol/L (ref 3.5–5.1)
Sodium: 139 mmol/L (ref 135–145)
Total Bilirubin: 0.5 mg/dL (ref 0.3–1.2)
Total Protein: 7.8 g/dL (ref 6.5–8.1)

## 2018-02-18 LAB — URINALYSIS, ROUTINE W REFLEX MICROSCOPIC
BILIRUBIN URINE: NEGATIVE
Glucose, UA: NEGATIVE mg/dL
HGB URINE DIPSTICK: NEGATIVE
Ketones, ur: 20 mg/dL — AB
Nitrite: NEGATIVE
PROTEIN: NEGATIVE mg/dL
SPECIFIC GRAVITY, URINE: 1.024 (ref 1.005–1.030)
pH: 6 (ref 5.0–8.0)

## 2018-02-18 LAB — CBC
HEMATOCRIT: 40 % (ref 36.0–46.0)
HEMOGLOBIN: 12.9 g/dL (ref 12.0–15.0)
MCH: 26.5 pg (ref 26.0–34.0)
MCHC: 32.3 g/dL (ref 30.0–36.0)
MCV: 82.3 fL (ref 78.0–100.0)
Platelets: 260 10*3/uL (ref 150–400)
RBC: 4.86 MIL/uL (ref 3.87–5.11)
RDW: 13.6 % (ref 11.5–15.5)
WBC: 7.8 10*3/uL (ref 4.0–10.5)

## 2018-02-18 LAB — LIPASE, BLOOD: Lipase: 22 U/L (ref 11–51)

## 2018-02-18 LAB — I-STAT BETA HCG BLOOD, ED (MC, WL, AP ONLY)

## 2018-02-18 MED ORDER — ONDANSETRON 4 MG PO TBDP
4.0000 mg | ORAL_TABLET | Freq: Once | ORAL | Status: AC
Start: 2018-02-18 — End: 2018-02-18
  Administered 2018-02-18: 4 mg via ORAL
  Filled 2018-02-18: qty 1

## 2018-02-18 MED ORDER — SODIUM CHLORIDE 0.9 % IV BOLUS
1000.0000 mL | Freq: Once | INTRAVENOUS | Status: AC
  Administered 2018-02-18: 1000 mL via INTRAVENOUS

## 2018-02-18 MED ORDER — ONDANSETRON 4 MG PO TBDP: 4.0000 mg | ORAL_TABLET | Freq: Once | ORAL | Status: DC | PRN

## 2018-02-18 MED ORDER — FAMOTIDINE 20 MG PO TABS
10.0000 mg | ORAL_TABLET | Freq: Once | ORAL | Status: AC
  Administered 2018-02-18: 10 mg via ORAL
  Filled 2018-02-18: qty 1

## 2018-02-18 MED ORDER — PANTOPRAZOLE SODIUM 40 MG IV SOLR
40.0000 mg | Freq: Once | INTRAVENOUS | Status: AC
  Administered 2018-02-18: 40 mg via INTRAVENOUS
  Filled 2018-02-18: qty 40

## 2018-02-18 MED ORDER — GI COCKTAIL ~~LOC~~
30.0000 mL | Freq: Once | ORAL | Status: AC
  Administered 2018-02-18: 30 mL via ORAL
  Filled 2018-02-18: qty 30

## 2018-02-18 MED ORDER — PROMETHAZINE HCL 25 MG/ML IJ SOLN
25.0000 mg | Freq: Once | INTRAMUSCULAR | Status: AC
  Administered 2018-02-18: 25 mg via INTRAVENOUS
  Filled 2018-02-18: qty 1
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
## 2018-02-18 NOTE — ED Triage Notes (Addendum)
Pt c/o throwing up blood x2 months, central chest tightness starting this morning, cough x1 week and throat hurting. Also c/o centralized abd pain around the umbilicus. Pt unable to have a bowel movement, unknown when las t bm was, but states its been a couple of days

## 2018-02-18 NOTE — ED Notes (Signed)
Pt took GI cocktail and immediately spit it back out and started vomiting. Small amount, pink tinged.

## 2018-02-18 NOTE — ED Notes (Signed)
Pt. Made aware for the need of urine specimen. 
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
## 2018-02-18 NOTE — ED Notes (Signed)
Pt updated that we are working to get her back as soon as possible. She verbalized understanding.

## 2018-02-18 NOTE — ED Notes (Signed)
Pt refuses EKG, pt unable to tolerate sitting up due to pain

## 2018-02-19 DIAGNOSIS — K2990 Gastroduodenitis, unspecified, without bleeding: Secondary | ICD-10-CM | POA: Diagnosis present

## 2018-02-19 DIAGNOSIS — R112 Nausea with vomiting, unspecified: Secondary | ICD-10-CM | POA: Diagnosis not present

## 2018-02-19 DIAGNOSIS — G8929 Other chronic pain: Secondary | ICD-10-CM | POA: Diagnosis present

## 2018-02-19 DIAGNOSIS — R1013 Epigastric pain: Secondary | ICD-10-CM | POA: Diagnosis present

## 2018-02-19 DIAGNOSIS — R1084 Generalized abdominal pain: Secondary | ICD-10-CM | POA: Diagnosis present

## 2018-02-19 DIAGNOSIS — Z91010 Allergy to peanuts: Secondary | ICD-10-CM | POA: Diagnosis not present

## 2018-02-19 DIAGNOSIS — Z87891 Personal history of nicotine dependence: Secondary | ICD-10-CM | POA: Diagnosis not present

## 2018-02-19 DIAGNOSIS — R1033 Periumbilical pain: Secondary | ICD-10-CM | POA: Diagnosis present

## 2018-02-19 DIAGNOSIS — Z91041 Radiographic dye allergy status: Secondary | ICD-10-CM | POA: Diagnosis not present

## 2018-02-19 DIAGNOSIS — K920 Hematemesis: Secondary | ICD-10-CM

## 2018-02-19 DIAGNOSIS — Z888 Allergy status to other drugs, medicaments and biological substances status: Secondary | ICD-10-CM | POA: Diagnosis not present

## 2018-02-19 DIAGNOSIS — Z79899 Other long term (current) drug therapy: Secondary | ICD-10-CM | POA: Diagnosis not present

## 2018-02-19 DIAGNOSIS — R109 Unspecified abdominal pain: Secondary | ICD-10-CM | POA: Diagnosis present

## 2018-02-19 DIAGNOSIS — J45909 Unspecified asthma, uncomplicated: Secondary | ICD-10-CM | POA: Diagnosis present

## 2018-02-19 DIAGNOSIS — E876 Hypokalemia: Secondary | ICD-10-CM | POA: Diagnosis present

## 2018-02-19 DIAGNOSIS — D649 Anemia, unspecified: Secondary | ICD-10-CM | POA: Diagnosis present

## 2018-02-19 DIAGNOSIS — K2970 Gastritis, unspecified, without bleeding: Secondary | ICD-10-CM | POA: Diagnosis present

## 2018-02-19 DIAGNOSIS — K209 Esophagitis, unspecified: Secondary | ICD-10-CM | POA: Diagnosis present

## 2018-02-19 DIAGNOSIS — Z91018 Allergy to other foods: Secondary | ICD-10-CM | POA: Diagnosis not present

## 2018-02-19 LAB — BASIC METABOLIC PANEL
Anion gap: 8 (ref 5–15)
BUN: 13 mg/dL (ref 6–20)
CHLORIDE: 107 mmol/L (ref 101–111)
CO2: 24 mmol/L (ref 22–32)
CREATININE: 0.77 mg/dL (ref 0.44–1.00)
Calcium: 8.7 mg/dL — ABNORMAL LOW (ref 8.9–10.3)
GFR calc Af Amer: >60 mL/min (ref >60)
GFR calc non Af Amer: >60 mL/min (ref >60)
Glucose, Bld: 84 mg/dL (ref 65–99)
Potassium: 3.3 mmol/L — ABNORMAL LOW (ref 3.5–5.1)
SODIUM: 139 mmol/L (ref 135–145)

## 2018-02-19 LAB — CBC
HCT: 35.4 % — ABNORMAL LOW (ref 36.0–46.0)
HEMOGLOBIN: 11.6 g/dL — AB (ref 12.0–15.0)
MCH: 26.9 pg (ref 26.0–34.0)
MCHC: 32.8 g/dL (ref 30.0–36.0)
MCV: 82.1 fL (ref 78.0–100.0)
Platelets: 216 10*3/uL (ref 150–400)
RBC: 4.31 MIL/uL (ref 3.87–5.11)
RDW: 13.6 % (ref 11.5–15.5)
WBC: 4.8 10*3/uL (ref 4.0–10.5)

## 2018-02-19 LAB — RAPID URINE DRUG SCREEN, HOSP PERFORMED
AMPHETAMINES: NOT DETECTED
BARBITURATES: NOT DETECTED
Benzodiazepines: NOT DETECTED
Cocaine: NOT DETECTED
Opiates: NOT DETECTED
TETRAHYDROCANNABINOL: POSITIVE — AB

## 2018-02-19 LAB — TYPE AND SCREEN
ABO/RH(D): O POS
ANTIBODY SCREEN: NEGATIVE

## 2018-02-19 LAB — ABO/RH: ABO/RH(D): O POS

## 2018-02-19 MED ORDER — ALBUTEROL SULFATE HFA 108 (90 BASE) MCG/ACT IN AERS: 1.0000 | INHALATION_SPRAY | Freq: Four times a day (QID) | RESPIRATORY_TRACT | Status: DC | PRN

## 2018-02-19 MED ORDER — SODIUM CHLORIDE 0.9% FLUSH
3.0000 mL | Freq: Two times a day (BID) | INTRAVENOUS | Status: DC
  Administered 2018-02-19 – 2018-02-20 (×3): 3 mL via INTRAVENOUS

## 2018-02-19 MED ORDER — PANTOPRAZOLE SODIUM 40 MG IV SOLR
40.0000 mg | Freq: Two times a day (BID) | INTRAVENOUS | Status: DC
Start: 2018-02-19 — End: 2018-02-20
  Administered 2018-02-19 – 2018-02-20 (×3): 40 mg via INTRAVENOUS
  Filled 2018-02-19 (×5): qty 40

## 2018-02-19 MED ORDER — ONDANSETRON HCL 4 MG PO TABS
4.0000 mg | ORAL_TABLET | Freq: Four times a day (QID) | ORAL | Status: DC | PRN
  Administered 2018-02-20 – 2018-02-21 (×2): 4 mg via ORAL
  Filled 2018-02-19 (×2): qty 1

## 2018-02-19 MED ORDER — ACETAMINOPHEN 325 MG PO TABS: 650.0000 mg | ORAL_TABLET | Freq: Four times a day (QID) | ORAL | Status: DC | PRN

## 2018-02-19 MED ORDER — ALBUTEROL SULFATE (2.5 MG/3ML) 0.083% IN NEBU: 2.5000 mg | INHALATION_SOLUTION | Freq: Four times a day (QID) | RESPIRATORY_TRACT | Status: DC | PRN

## 2018-02-19 MED ORDER — POTASSIUM CHLORIDE CRYS ER 20 MEQ PO TBCR
40.0000 meq | EXTENDED_RELEASE_TABLET | Freq: Once | ORAL | Status: AC
  Administered 2018-02-19: 40 meq via ORAL
  Filled 2018-02-19: qty 2

## 2018-02-19 MED ORDER — MORPHINE SULFATE (PF) 2 MG/ML IV SOLN
1.0000 mg | INTRAVENOUS | Status: DC | PRN
  Administered 2018-02-19 – 2018-02-20 (×6): 2 mg via INTRAVENOUS
  Filled 2018-02-19 (×6): qty 1

## 2018-02-19 MED ORDER — LIP MEDEX EX OINT
TOPICAL_OINTMENT | CUTANEOUS | Status: DC | PRN
  Filled 2018-02-19: qty 7

## 2018-02-19 MED ORDER — ACETAMINOPHEN 650 MG RE SUPP: 650.0000 mg | Freq: Four times a day (QID) | RECTAL | Status: DC | PRN

## 2018-02-19 MED ORDER — SODIUM CHLORIDE 0.9 % IV SOLN
INTRAVENOUS | Status: DC
  Administered 2018-02-19 – 2018-02-21 (×6): via INTRAVENOUS

## 2018-02-19 MED ORDER — ONDANSETRON HCL 4 MG/2ML IJ SOLN
4.0000 mg | Freq: Four times a day (QID) | INTRAMUSCULAR | Status: DC | PRN
  Administered 2018-02-19: 4 mg via INTRAVENOUS
  Filled 2018-02-19: qty 2

## 2018-02-19 MED ORDER — TRAMADOL HCL 50 MG PO TABS
50.0000 mg | ORAL_TABLET | Freq: Four times a day (QID) | ORAL | Status: DC | PRN
  Administered 2018-02-19 – 2018-02-21 (×4): 50 mg via ORAL
  Filled 2018-02-19 (×4): qty 1

## 2018-02-19 MED ORDER — SODIUM CHLORIDE 0.9 % IV SOLN: INTRAVENOUS | Status: DC
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
## 2018-02-19 NOTE — ED Notes (Signed)
Bed: WA32 Expected date:  Expected time:  Means of arrival:  Comments: 
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
## 2018-02-19 NOTE — Progress Notes (Signed)
PROGRESS NOTE    [name] [name]  YNW:[id number] DOB: 1991-10-27 DOA: 02/18/2018 PCP: Patient, No Pcp Per    Brief Narrative: [name] [name] is a 27 y.o. female with medical history significant of hematemesis 2/2 gastritis, depression, asthma, umbilical hernia status post repair in 11/2016, and chronic abdominal pain; who presents with complaints of epigastric abdominal pain with nausea and vomiting up blood.  Patient admitted for evaluation of upper GI bleed/esophagitis.  GI consulted and plan for EGD in the morning.  Assessment & Plan:   Principal Problem:   Hematemesis Active Problems:   Nausea & vomiting   Abdominal pain   Acute hematemesis with associated nausea and vomiting.  Patient has a prior history of gastritis on EGD performed by Dr. Bosie ClosSchooler in October 2018. Admit for evaluation of upper GI bleed versus esophagitis.  N.p.o., gentle hydration, IV Protonix, pain control. GI consulted and plan for EGD in the morning.  Transfuse to keep hemoglobin greater than 7.   Epigastric abdominal pain with nausea and vomiting Suspect gastritis versus esophagitis.  CT of the abdomen and pelvis does not show any acute intra-abdominal etiology.   Asthma No wheezing heard albuterol inhaler as needed.  Hypokalemia Replaced Repeat in the morning.   DVT prophylaxis: SCDs Code Status: Full code Family Communication: None at bedside Disposition Plan: pending evaluation by GI.   Consultants:   Gastroenterology.    Procedures: Schedule for EGD in the morning   Antimicrobials:none.    Subjective: Abdominal pain.   Objective: Vitals:   02/19/18 1115 02/19/18 1130 02/19/18 1200 02/19/18 1230  BP: 100/67 112/71 101/74 104/72  Pulse: 99 87 86 84  Resp: (!) 21 16 18 17   Temp:      TempSrc:      SpO2: 98% 98% 98% 98%  Weight:      Height:        Intake/Output Summary (Last 24 hours) at 02/19/2018 1249 Last data filed at 02/19/2018 1148 Gross per 24 hour  Intake 2000 ml    Output -  Net 2000 ml   Filed Weights   02/18/18 1341  Weight: 61.2 kg (135 lb)    Examination:  General exam: Appears calm and comfortable  Respiratory system: Clear to auscultation. Respiratory effort normal. Cardiovascular system: S1 & S2 heard, RRR. No JVD, murmurs, rubs, gallops or clicks. No pedal edema. Gastrointestinal system: Abdomen is nondistended, soft and nontender. No organomegaly or masses felt. Normal bowel sounds heard. Central nervous system: Alert and oriented. No focal neurological deficits. Extremities: Symmetric 5 x 5 power. Skin: No rashes, lesions or ulcers Psychiatry: Judgement and insight appear normal. Mood & affect appropriate.     Data Reviewed: I have personally reviewed following labs and imaging studies  CBC: Recent Labs  Lab 02/18/18 1403 02/19/18 0922  WBC 7.8 4.8  HGB 12.9 11.6*  HCT 40.0 35.4*  MCV 82.3 82.1  PLT 260 216   Basic Metabolic Panel: Recent Labs  Lab 02/18/18 1403 02/19/18 0922  NA 139 139  K 4.1 3.3*  CL 106 107  CO2 26 24  GLUCOSE 94 84  BUN 14 13  CREATININE 0.64 0.77  CALCIUM 9.4 8.7*   GFR: Estimated Creatinine Clearance: 92.5 mL/min (by C-G formula based on SCr of 0.77 mg/dL). Liver Function Tests: Recent Labs  Lab 02/18/18 1403  AST 19  ALT 16  ALKPHOS 71  BILITOT 0.5  PROT 7.8  ALBUMIN 4.0   Recent Labs  Lab 02/18/18 1403  LIPASE 22   No  results for input(s): AMMONIA in the last 168 hours. Coagulation Profile: No results for input(s): INR, PROTIME in the last 168 hours. Cardiac Enzymes: No results for input(s): CKTOTAL, CKMB, CKMBINDEX, TROPONINI in the last 168 hours. BNP (last 3 results) No results for input(s): PROBNP in the last 8760 hours. HbA1C: No results for input(s): HGBA1C in the last 72 hours. CBG: No results for input(s): GLUCAP in the last 168 hours. Lipid Profile: No results for input(s): CHOL, HDL, LDLCALC, TRIG, CHOLHDL, LDLDIRECT in the last 72 hours. Thyroid  Function Tests: No results for input(s): TSH, T4TOTAL, FREET4, T3FREE, THYROIDAB in the last 72 hours. Anemia Panel: No results for input(s): VITAMINB12, FOLATE, FERRITIN, TIBC, IRON, RETICCTPCT in the last 72 hours. Sepsis Labs: No results for input(s): PROCALCITON, LATICACIDVEN in the last 168 hours.  No results found for this or any previous visit (from the past 240 hour(s)).       Radiology Studies: Ct Abdomen Pelvis Wo Contrast  Result Date: 02/19/2018 CLINICAL DATA:  27 y/o F; 2 months of throwing up blood and central chest tightness with 1 week of cough and throat pain. Also centralized abdominal pain around the umbilicus and constipation. EXAM: CT ABDOMEN AND PELVIS WITHOUT CONTRAST TECHNIQUE: Multidetector CT imaging of the abdomen and pelvis was performed following the standard protocol without IV contrast. COMPARISON:  08/18/2017 CT abdomen and pelvis. FINDINGS: Lower chest: No acute abnormality. Hepatobiliary: No focal liver abnormality is seen. No gallstones, gallbladder wall thickening, or biliary dilatation. Pancreas: Unremarkable. No pancreatic ductal dilatation or surrounding inflammatory changes. Spleen: Normal in size without focal abnormality. Adrenals/Urinary Tract: Adrenal glands are unremarkable. Right kidney upper pole punctate nonobstructing stone. Kidneys are otherwise normal, without renal calculi, focal lesion, or hydronephrosis. Bladder is unremarkable. Stomach/Bowel: Stomach is within normal limits. Appendix appears normal. No evidence of bowel wall thickening, distention, or inflammatory changes. Vascular/Lymphatic: No significant vascular findings are present. No enlarged abdominal or pelvic lymph nodes. Reproductive: Uterus and bilateral adnexa are unremarkable. Other: No abdominal wall hernia or abnormality. No abdominopelvic ascites. Musculoskeletal: No fracture is seen. IMPRESSION: 1. No acute process identified. 2. Punctate right kidney upper pole nonobstructing  stone. Electronically Signed   By: Mitzi Hansen M.D.   On: 02/19/2018 00:14   Dg Chest 2 View  Result Date: 02/18/2018 CLINICAL DATA:  Chest pain short of breath EXAM: CHEST - 2 VIEW COMPARISON:  09/10/2017 FINDINGS: The heart size and mediastinal contours are within normal limits. Both lungs are clear. The visualized skeletal structures are unremarkable. IMPRESSION: No active cardiopulmonary disease. Electronically Signed   By: Jasmine Pang M.D.   On: 02/18/2018 14:58        Scheduled Meds: . pantoprazole (PROTONIX) IV  40 mg Intravenous Q12H  . sodium chloride flush  3 mL Intravenous Q12H   Continuous Infusions: . sodium chloride 100 mL/hr at 02/19/18 1149     LOS: 0 days    Time spent: 35 MINUTES.    Kathlen Mody, MD [facility] Pager [phone]   If 7PM-7AM, please contact night-coverage [email or web address] Password [email or web address] 02/19/2018, 12:49 PM

## 2018-02-20 ENCOUNTER — Encounter (HOSPITAL_COMMUNITY): Payer: Self-pay | Admitting: *Deleted

## 2018-02-20 ENCOUNTER — Inpatient Hospital Stay (HOSPITAL_COMMUNITY): Payer: Medicaid Other | Admitting: Anesthesiology

## 2018-02-20 ENCOUNTER — Encounter (HOSPITAL_COMMUNITY)
Admission: EM | Disposition: A | Discharge status: Home / Self Care | Payer: Self-pay | Source: Home / Self Care | Attending: Internal Medicine

## 2018-02-20 DIAGNOSIS — K2970 Gastritis, unspecified, without bleeding: Secondary | ICD-10-CM

## 2018-02-20 DIAGNOSIS — K2990 Gastroduodenitis, unspecified, without bleeding: Secondary | ICD-10-CM

## 2018-02-20 HISTORY — PX: ESOPHAGOGASTRODUODENOSCOPY (EGD) WITH PROPOFOL: SHX5813

## 2018-02-20 LAB — BASIC METABOLIC PANEL
ANION GAP: 6 (ref 5–15)
BUN: 8 mg/dL (ref 6–20)
CALCIUM: 8.7 mg/dL — AB (ref 8.9–10.3)
CO2: 27 mmol/L (ref 22–32)
Chloride: 105 mmol/L (ref 101–111)
Creatinine, Ser: 0.7 mg/dL (ref 0.44–1.00)
GFR calc Af Amer: >60 mL/min (ref >60)
GLUCOSE: 87 mg/dL (ref 65–99)
Potassium: 3.5 mmol/L (ref 3.5–5.1)
Sodium: 138 mmol/L (ref 135–145)

## 2018-02-20 LAB — CBC
HEMATOCRIT: 37 % (ref 36.0–46.0)
Hemoglobin: 11.8 g/dL — ABNORMAL LOW (ref 12.0–15.0)
MCH: 26.6 pg (ref 26.0–34.0)
MCHC: 31.9 g/dL (ref 30.0–36.0)
MCV: 83.3 fL (ref 78.0–100.0)
PLATELETS: 233 10*3/uL (ref 150–400)
RBC: 4.44 MIL/uL (ref 3.87–5.11)
RDW: 13.7 % (ref 11.5–15.5)
WBC: 4.9 10*3/uL (ref 4.0–10.5)

## 2018-02-20 SURGERY — ESOPHAGOGASTRODUODENOSCOPY (EGD) WITH PROPOFOL
Anesthesia: Monitor Anesthesia Care

## 2018-02-20 MED ORDER — DIPHENHYDRAMINE HCL 25 MG PO CAPS
25.0000 mg | ORAL_CAPSULE | Freq: Four times a day (QID) | ORAL | Status: DC | PRN
  Administered 2018-02-20 – 2018-02-21 (×3): 25 mg via ORAL
  Filled 2018-02-20 (×3): qty 1

## 2018-02-20 MED ORDER — PROPOFOL 10 MG/ML IV BOLUS
INTRAVENOUS | Status: AC
  Filled 2018-02-20: qty 40

## 2018-02-20 MED ORDER — LACTATED RINGERS IV SOLN
INTRAVENOUS | Status: DC
  Administered 2018-02-20: 10:00:00 via INTRAVENOUS

## 2018-02-20 MED ORDER — PANTOPRAZOLE SODIUM 40 MG PO TBEC
40.0000 mg | DELAYED_RELEASE_TABLET | Freq: Every day | ORAL | Status: DC
  Administered 2018-02-21: 40 mg via ORAL
  Filled 2018-02-20 (×2): qty 1

## 2018-02-20 MED ORDER — PROPOFOL 500 MG/50ML IV EMUL
INTRAVENOUS | Status: DC | PRN
  Administered 2018-02-20: 150 ug/kg/min via INTRAVENOUS

## 2018-02-20 MED ORDER — PROPOFOL 500 MG/50ML IV EMUL
INTRAVENOUS | Status: DC | PRN
  Administered 2018-02-20: 30 mg via INTRAVENOUS

## 2018-02-20 MED ORDER — HYDROCORTISONE 1 % EX CREA
TOPICAL_CREAM | Freq: Two times a day (BID) | CUTANEOUS | Status: DC
  Administered 2018-02-20: 1 via TOPICAL
  Administered 2018-02-20 – 2018-02-21 (×3): via TOPICAL
  Filled 2018-02-20: qty 28

## 2018-02-20 SURGICAL SUPPLY — 15 items

## 2018-02-20 NOTE — Op Note (Signed)
[facility] Patient Name: [name] [name] Procedure Date: 02/20/2018 MRN: [id number] Attending MD: Rachael Fee , MD Date of Birth: 1991-02-20 CSN: [id number] Age: 27 Admit Type: Outpatient Procedure:                Upper GI endoscopy Indications:              Generalized abdominal pain, Hematemesis, Nausea                            with vomiting; EGD 08/2017 Dr. Bosie Clos from [place]                            [facility] for hematemesis was essentially normal. Past 6                            month workup has included 07/2017 CT abd/pelvis with                            IV contrast; 12/2017 [place], 01/2018 CT abd/pelvis                            without IV contrast; EGD 08/2017 Bosie Clos).                            Admitting labs yesterday show normal CBC, normal                            CMET, +THC drug screen (says she rarely smokes pot                            but did last week once). Providers:                Rachael Fee, MD, Jacquiline Doe, RN, Verita Schneiders, Technician, Mirian Mo, CRNA Referring MD:             [facility] Medicines:                Monitored Anesthesia Care Complications:            No immediate complications. Estimated blood loss:                            None. Estimated Blood Loss:     Estimated blood loss: none. Procedure:                Pre-Anesthesia Assessment:                           - Prior to the procedure, a History and Physical                            was performed, and patient medications and  allergies were reviewed. The patient's tolerance of                            previous anesthesia was also reviewed. The risks                            and benefits of the procedure and the sedation                            options and risks were discussed with the patient.                            All questions were answered, and informed consent      was obtained. Prior Anticoagulants: The patient has                            taken no previous anticoagulant or antiplatelet                            agents. ASA Grade Assessment: II - A patient with                            mild systemic disease. After reviewing the risks                            and benefits, the patient was deemed in                            satisfactory condition to undergo the procedure.                           After obtaining informed consent, the endoscope was                            passed under direct vision. Throughout the                            procedure, the patient's blood pressure, pulse, and                            oxygen saturations were monitored continuously. The                            [id number] ([id number]) scope was introduced through the                            mouth, and advanced to the second part of duodenum.                            The upper GI endoscopy was accomplished without                            difficulty. The patient tolerated the procedure  well. Scope In: Scope Out: Findings:      Very minimal inflammation was found in the gastric antrum. Biopsies were       taken with a cold forceps for histology.      The exam was otherwise without abnormality. Impression:               - Very minimal distal gastritis, biopsied to check                            for H. pylori.                           - The examination was otherwise normal. No blood or                            obvious source of bleeding in the UGI tract. I                            suspect (similar to Dr. Bosie Clos 5 months ago) that                            the blood which she sees is probably from                            oropharynx and I recommend an ENT evaluation. Moderate Sedation:      N/A- Per Anesthesia Care Recommendation:           - Return to hospital room for ongoing care.                           -  If biopsies show H. pylori, she should be started                            on appropriate antibiotics.                           - Treat symptoms empirically otherwise (PPI once                            daily, antiemetic medicines PRN Procedure Code(s):        --- Professional ---                           [phone], Esophagogastroduodenoscopy, flexible,                            transoral; with biopsy, single or multiple Diagnosis Code(s):        --- Professional ---                           K29.70, Gastritis, unspecified, without bleeding                           R10.84, Generalized abdominal pain  K92.0, Hematemesis                           R11.2, Nausea with vomiting, unspecified CPT copyright 2016 [employer] Medical [employer]. All rights reserved. The codes documented in this report are preliminary and upon coder review may  be revised to meet current compliance requirements. Rachael Feeaniel P , MD 02/20/2018 11:16:45 AM This report has been signed electronically. Number of Addenda: 0

## 2018-02-20 NOTE — Interval H&P Note (Signed)
History and Physical Interval Note:  02/20/2018 9:57 AM  [name] [name]  has presented today for surgery, with the diagnosis of Hematemesis  The various methods of treatment have been discussed with the patient and family. After consideration of risks, benefits and other options for treatment, the patient has consented to  Procedure(s): ESOPHAGOGASTRODUODENOSCOPY (EGD) WITH PROPOFOL (N/A) as a surgical intervention .  The patient's history has been reviewed, patient examined, no change in status, stable for surgery.  I have reviewed the patient's chart and labs.  Questions were answered to the patient's satisfaction.     Rachael Feeaniel P Jacobs

## 2018-02-20 NOTE — Progress Notes (Signed)
PROGRESS NOTE    [name] [name]  ZOX:[id number] DOB: 1990/12/15 DOA: 02/18/2018 PCP: Patient, No Pcp Per    Brief Narrative: [name] Eisenmengermber Flannigan is a 27 y.o. female with medical history significant of hematemesis 2/2 gastritis, depression, asthma, umbilical hernia status post repair in 11/2016, and chronic abdominal pain; who presents with complaints of epigastric abdominal pain with nausea and vomiting up blood.  Patient admitted for evaluation of upper GI bleed/esophagitis.  GI consulted and plan for EGD in the morning.  Assessment & Plan:   Principal Problem:   Hematemesis Active Problems:   Nausea & vomiting   Abdominal pain   Acute hematemesis with associated nausea and vomiting.  Patient has a prior history of gastritis on EGD performed by Dr. Bosie ClosSchooler in October 2018. Admit for evaluation of upper GI bleed versus esophagitis.  N.p.o., gentle hydration, IV Protonix, pain control. GI consulted and plan for EGD today.   Transfuse to keep hemoglobin greater than 7. Baseline hemoglobin between 12 to 13, and it has dropped to 11.8. No more episodes of hematemesis.     Epigastric abdominal pain with nausea and vomiting Suspect gastritis versus esophagitis.  CT of the abdomen and pelvis does not show any acute intra-abdominal etiology.plan for EGD today.  Pain control. Lipase and liver function tests wnl.     Asthma No wheezing heard albuterol inhaler as needed.  Hypokalemia Replaced Repeat wnl.    DVT prophylaxis: SCDs Code Status: Full code Family Communication: None at bedside Disposition Plan: pending evaluation by GI.   Consultants:   Gastroenterology.    Procedures: Schedule for EGD in the morning   Antimicrobials:none.    Subjective: Pt continues to have epigastric abd pain.   Objective: Vitals:   02/19/18 1856 02/19/18 2008 02/19/18 2021 02/20/18 0601  BP: 114/69 101/69 109/76 (!) 99/59  Pulse: 81 78 77 68  Resp: 20 20 18 18   Temp: (!) 100.5 F (38.1  C) 98.2 F (36.8 C) 98.7 F (37.1 C) 98.4 F (36.9 C)  TempSrc: Oral Oral Oral Oral  SpO2: 99% 100% 100% 100%  Weight:      Height:        Intake/Output Summary (Last 24 hours) at 02/20/2018 0804 Last data filed at 02/19/2018 1148 Gross per 24 hour  Intake 1000 ml  Output -  Net 1000 ml   Filed Weights   02/18/18 1341  Weight: 61.2 kg (135 lb)    Examination:  General exam: Appears uncomfortable.  Respiratory system: Clear to auscultation. Respiratory effort normal. Cardiovascular system: S1 & S2 heard, RRR. No JVD, murmurs, rubs, gallops or clicks. No pedal edema. Gastrointestinal system: Abdomen is soft mildly tender , non distended bowel sounds are good.  Neuro: pt is alert and oriented. No focal neurological deficits. Extremities: Symmetric 5 x 5 power. Skin: No rashes, lesions or ulcers Psychiatry:  Mood & affect appropriate.     Data Reviewed: I have personally reviewed following labs and imaging studies  CBC: Recent Labs  Lab 02/18/18 1403 02/19/18 0922 02/20/18 0547  WBC 7.8 4.8 4.9  HGB 12.9 11.6* 11.8*  HCT 40.0 35.4* 37.0  MCV 82.3 82.1 83.3  PLT 260 216 233   Basic Metabolic Panel: Recent Labs  Lab 02/18/18 1403 02/19/18 0922 02/20/18 0547  NA 139 139 138  K 4.1 3.3* 3.5  CL 106 107 105  CO2 26 24 27   GLUCOSE 94 84 87  BUN 14 13 8   CREATININE 0.64 0.77 0.70  CALCIUM 9.4 8.7* 8.7*  GFR: Estimated Creatinine Clearance: 92.5 mL/min (by C-G formula based on SCr of 0.7 mg/dL). Liver Function Tests: Recent Labs  Lab 02/18/18 1403  AST 19  ALT 16  ALKPHOS 71  BILITOT 0.5  PROT 7.8  ALBUMIN 4.0   Recent Labs  Lab 02/18/18 1403  LIPASE 22   No results for input(s): AMMONIA in the last 168 hours. Coagulation Profile: No results for input(s): INR, PROTIME in the last 168 hours. Cardiac Enzymes: No results for input(s): CKTOTAL, CKMB, CKMBINDEX, TROPONINI in the last 168 hours. BNP (last 3 results) No results for input(s): PROBNP  in the last 8760 hours. HbA1C: No results for input(s): HGBA1C in the last 72 hours. CBG: No results for input(s): GLUCAP in the last 168 hours. Lipid Profile: No results for input(s): CHOL, HDL, LDLCALC, TRIG, CHOLHDL, LDLDIRECT in the last 72 hours. Thyroid Function Tests: No results for input(s): TSH, T4TOTAL, FREET4, T3FREE, THYROIDAB in the last 72 hours. Anemia Panel: No results for input(s): VITAMINB12, FOLATE, FERRITIN, TIBC, IRON, RETICCTPCT in the last 72 hours. Sepsis Labs: No results for input(s): PROCALCITON, LATICACIDVEN in the last 168 hours.  No results found for this or any previous visit (from the past 240 hour(s)).       Radiology Studies: Ct Abdomen Pelvis Wo Contrast  Result Date: 02/19/2018 CLINICAL DATA:  27 y/o F; 2 months of throwing up blood and central chest tightness with 1 week of cough and throat pain. Also centralized abdominal pain around the umbilicus and constipation. EXAM: CT ABDOMEN AND PELVIS WITHOUT CONTRAST TECHNIQUE: Multidetector CT imaging of the abdomen and pelvis was performed following the standard protocol without IV contrast. COMPARISON:  08/18/2017 CT abdomen and pelvis. FINDINGS: Lower chest: No acute abnormality. Hepatobiliary: No focal liver abnormality is seen. No gallstones, gallbladder wall thickening, or biliary dilatation. Pancreas: Unremarkable. No pancreatic ductal dilatation or surrounding inflammatory changes. Spleen: Normal in size without focal abnormality. Adrenals/Urinary Tract: Adrenal glands are unremarkable. Right kidney upper pole punctate nonobstructing stone. Kidneys are otherwise normal, without renal calculi, focal lesion, or hydronephrosis. Bladder is unremarkable. Stomach/Bowel: Stomach is within normal limits. Appendix appears normal. No evidence of bowel wall thickening, distention, or inflammatory changes. Vascular/Lymphatic: No significant vascular findings are present. No enlarged abdominal or pelvic lymph nodes.  Reproductive: Uterus and bilateral adnexa are unremarkable. Other: No abdominal wall hernia or abnormality. No abdominopelvic ascites. Musculoskeletal: No fracture is seen. IMPRESSION: 1. No acute process identified. 2. Punctate right kidney upper pole nonobstructing stone. Electronically Signed   By: Mitzi Hansen M.D.   On: 02/19/2018 00:14   Dg Chest 2 View  Result Date: 02/18/2018 CLINICAL DATA:  Chest pain short of breath EXAM: CHEST - 2 VIEW COMPARISON:  09/10/2017 FINDINGS: The heart size and mediastinal contours are within normal limits. Both lungs are clear. The visualized skeletal structures are unremarkable. IMPRESSION: No active cardiopulmonary disease. Electronically Signed   By: Jasmine Pang M.D.   On: 02/18/2018 14:58        Scheduled Meds: . hydrocortisone cream   Topical BID  . pantoprazole (PROTONIX) IV  40 mg Intravenous Q12H  . sodium chloride flush  3 mL Intravenous Q12H   Continuous Infusions: . sodium chloride 100 mL/hr at 02/20/18 4098  . sodium chloride       LOS: 1 day    Time spent: 35 MINUTES.    Kathlen Mody, MD [facility] Pager [phone]   If 7PM-7AM, please contact night-coverage [email or web address] Password [facility] 02/20/2018, 8:04 AM

## 2018-02-20 NOTE — Anesthesia Postprocedure Evaluation (Signed)
Anesthesia Post Note  Patient: [name] [name]  Procedure(s) Performed: ESOPHAGOGASTRODUODENOSCOPY (EGD) WITH PROPOFOL (N/A )     Patient location during evaluation: Endoscopy Anesthesia Type: MAC Level of consciousness: awake Pain management: pain level controlled Vital Signs Assessment: post-procedure vital signs reviewed and stable Cardiovascular status: stable Postop Assessment: no apparent nausea or vomiting Anesthetic complications: no    Last Vitals:  Vitals:   02/20/18 1120 02/20/18 1201  BP: (!) 102/57 113/81  Pulse: 72 79  Resp: 17 18  Temp:  36.8 C  SpO2: 98% 100%    Last Pain:  Vitals:   02/20/18 1201  TempSrc: Oral  PainSc:    Pain Goal: Patients Stated Pain Goal: 3 (02/20/18 0653)               Rutledge Selsor JR,JOHN Mateo Flow

## 2018-02-20 NOTE — Transfer of Care (Signed)
Immediate Anesthesia Transfer of Care Note  Patient: Adalynn Corne  Procedure(s) Performed: ESOPHAGOGASTRODUODENOSCOPY (EGD) WITH PROPOFOL (N/A )  Patient Location: PACU  Anesthesia Type:MAC  Level of Consciousness: awake, alert  and oriented  Airway & Oxygen Therapy: Patient Spontanous Breathing and Patient connected to nasal cannula oxygen  Post-op Assessment: Report given to RN and Post -op Vital signs reviewed and stable  Post vital signs: Reviewed and stable  Last Vitals:  Vitals Value Taken Time  BP 103/55 02/20/2018 11:12 AM  Temp    Pulse 73 02/20/2018 11:12 AM  Resp 17 02/20/2018 11:12 AM  SpO2 100 % 02/20/2018 11:12 AM  Vitals shown include unvalidated device data.  Last Pain:  Vitals:   02/20/18 1112  TempSrc:   PainSc: 0-No pain      Patients Stated Pain Goal: 3 (34/74/25 9563)  Complications: No apparent anesthesia complications

## 2018-02-20 NOTE — Anesthesia Preprocedure Evaluation (Signed)
Anesthesia Evaluation  Patient identified by MRN, date of birth, ID band Patient awake    Reviewed: Allergy & Precautions, NPO status , Patient's Chart, lab work & pertinent test results  Airway Mallampati: I  TM Distance: >3 FB Neck ROM: Full    Dental no notable dental hx. (+) Dental Advisory Given, Teeth Intact   Pulmonary asthma , former smoker,    Pulmonary exam normal breath sounds clear to auscultation       Cardiovascular negative cardio ROS   Rhythm:Regular Rate:Normal     Neuro/Psych Depression negative neurological ROS     GI/Hepatic Neg liver ROS, GI bleed   Endo/Other  negative endocrine ROS  Renal/GU negative Renal ROS     Musculoskeletal negative musculoskeletal ROS (+)   Abdominal Normal abdominal exam  (+)   Peds  Hematology  (+) anemia ,   Anesthesia Other Findings   Reproductive/Obstetrics                             Anesthesia Physical  Anesthesia Plan  ASA: II  Anesthesia Plan: MAC   Post-op Pain Management:    Induction: Intravenous  PONV Risk Score and Plan: 2 and Ondansetron, Propofol infusion and Treatment may vary due to age or medical condition  Airway Management Planned: Natural Airway, Nasal Cannula and Mask  Additional Equipment:   Intra-op Plan:   Post-operative Plan:   Informed Consent: I have reviewed the patients History and Physical, chart, labs and discussed the procedure including the risks, benefits and alternatives for the proposed anesthesia with the patient or authorized representative who has indicated his/her understanding and acceptance.     Plan Discussed with: CRNA  Anesthesia Plan Comments:         Anesthesia Quick Evaluation

## 2018-02-21 ENCOUNTER — Encounter (HOSPITAL_COMMUNITY): Payer: Self-pay | Admitting: Gastroenterology

## 2018-02-21 MED ORDER — PANTOPRAZOLE SODIUM 40 MG PO TBEC: 40.0000 mg | DELAYED_RELEASE_TABLET | Freq: Every day | ORAL | 0 refills | Status: DC

## 2018-02-21 MED ORDER — TRAMADOL HCL 50 MG PO TABS: 50.0000 mg | ORAL_TABLET | Freq: Four times a day (QID) | ORAL | 0 refills | Status: DC | PRN

## 2018-02-21 MED ORDER — POLYETHYLENE GLYCOL 3350 17 G PO PACK: 17.0000 g | PACK | Freq: Every day | ORAL | 0 refills | Status: DC

## 2018-02-21 MED ORDER — POLYETHYLENE GLYCOL 3350 17 G PO PACK
17.0000 g | PACK | Freq: Every day | ORAL | Status: DC
  Administered 2018-02-21: 17 g via ORAL
  Filled 2018-02-21: qty 1

## 2018-02-21 MED ORDER — ONDANSETRON HCL 4 MG PO TABS: 4.0000 mg | ORAL_TABLET | Freq: Four times a day (QID) | ORAL | 0 refills | Status: DC | PRN

## 2018-02-21 NOTE — Progress Notes (Addendum)
    Progress Note   Subjective  Chief Complaint: Hematemesis, Abdominal Pain  Pt reports continuing with peri-umbilical pain this morning and feeling nauseous when she looks at food. Reports vomiting 2 times yesterday with no further blood. Tells me she has not had a BM in over a week. Asks if her 26 yr old son can come visit and tells me she would like to go home.   Objective   Vital signs in last 24 hours: Temp:  [98 F (36.7 C)-98.9 F (37.2 C)] 98.6 F (37 C) (03/29 0649) Pulse Rate:  [64-85] 68 (03/29 0649) Resp:  [14-18] 18 (03/29 0649) BP: (99-119)/(55-85) 100/60 (03/29 0649) SpO2:  [98 %-100 %] 100 % (03/29 0649) Weight:  [135 lb (61.2 kg)] 135 lb (61.2 kg) (03/28 0955) Last BM Date: 02/19/18 General:   female in NAD Heart:  Regular rate and rhythm; no murmurs Lungs: Respirations even and unlabored, lungs CTA bilaterally Abdomen:  Soft, mild periumbilical  and nondistended. Normal bowel sounds. Extremities:  Without edema. Neurologic:  Alert and oriented,  grossly normal neurologically. Psych:  Cooperative. Normal mood and affect.  Lab Results: Recent Labs    02/18/18 1403 02/19/18 0922 02/20/18 0547  WBC 7.8 4.8 4.9  HGB 12.9 11.6* 11.8*  HCT 40.0 35.4* 37.0  PLT 260 216 233   BMET Recent Labs    02/18/18 1403 02/19/18 0922 02/20/18 0547  NA 139 139 138  K 4.1 3.3* 3.5  CL 106 107 105  CO2 26 24 27   GLUCOSE 94 84 87  BUN 14 13 8   CREATININE 0.64 0.77 0.70  CALCIUM 9.4 8.7* 8.7*   LFT Recent Labs    02/18/18 1403  PROT 7.8  ALBUMIN 4.0  AST 19  ALT 16  ALKPHOS 71  BILITOT 0.5     Assessment / Plan:   Assessment: 1. Hematemesis:patient reports no further episodes overnight, EGD yesterday with only mild gastritis and no origin for bleeding, continues with nausea today; suspect functional 2. Abdominal Pain: periumbilical , with above, possible element of constipation, will add Miralax today  Plan: 1. Continue supportive measures 2. Again  recommend ENT eval for possible oropharygeal bleeding. 3. Adding Mirlax qd 4.  Please await final recommendations from Dr. Christella HartiganJacobs.  Thank you for your kind consultation.   LOS: 2 days   Unk LightningJennifer Lynne Lemmon  02/21/2018, 9:15 AM  Pager # [phone]   ________________________________________________________________________  [facility] MD note:  I personally examined the patient, reviewed the data and agree with the assessment and plan described above.  STill with some nausea and abd pains, she is sitting up trying to eat a regular breakfast.   She asked about FMLA type papers to fill out, which I declined.  I recommend d/c home when she feels ready, PPI once daily, PRN antiemetics.  I will contact her with result of gastritis biopsies, if positive for H. Pylori will put her on appropriate antibiotics. I'm most suspicious of functional issues here however.   Rob Buntinganiel Freddie Nghiem, MD [facility] Pager [phone]

## 2018-02-24 NOTE — Discharge Summary (Addendum)
Physician Discharge Summary  [name] [name] ZOX:[id number] DOB: 04-03-1991 DOA: 02/18/2018  PCP: Patient, No Pcp Per  Admit date: 02/18/2018 Discharge date: 02/21/2018  Admitted From: Home.  Disposition:  Home.   Recommendations for Outpatient Follow-up:  1. Follow up with PCP in 1-2 weeks 2. Please obtain BMP/CBC in one week Please follow up with gastroenterology in one week to follow the biopsy results.  Recommend ENT follow up as outpatient in 1 to 2 weeks for oropharyngeal bleeding.   Discharge Condition:stable.  CODE STATUS:full code.  Diet recommendation: Heart Healthy   Brief/Interim Summary: [name] Martinezis a 27 y.o.femalewith medical history significant of hematemesis 2/2 gastritis, depression, asthma, umbilical hernia status post repair in 11/2016, and chronic abdominal pain; who presents with complaints of epigastric abdominal pain with nausea and vomiting up blood.  Patient admitted for evaluation of upper GI bleed/esophagitis.  GI consulted and underwent EGD.   Discharge Diagnoses:  Principal Problem:   Hematemesis Active Problems:   Nausea & vomiting   Abdominal pain   Gastritis and gastroduodenitis  Acute hematemesis with associated nausea and vomiting.  Patient has a prior history of gastritis on EGD performed by Dr. Bosie Clos in October 2018. Admit for evaluation of upper GI bleed versus esophagitis.she was made  N.p.o, IV Protonix, pain control. GI consulted and underwent EGD , showing minimal gastritis. Recommended ENT evaluation.  Pt reports her symptoms have resolved. Discharged her on oral PPI.    Transfuse to keep hemoglobin greater than 7. Baseline hemoglobin between 12 to 13, and it has dropped to 11.8. No more episodes of hematemesis.     Epigastric abdominal pain with nausea and vomiting Suspect gastritis versus esophagitis.  CT of the abdomen and pelvis does not show any acute intra-abdominal etiology.plan for EGD today.  Pain control. Lipase and  liver function tests wnl.     Asthma No wheezing heard albuterol inhaler as needed.  Hypokalemia Replaced Repeat wnl.      Discharge Instructions  Discharge Instructions    Diet - low sodium heart healthy   Complete by:  As directed    Discharge instructions   Complete by:  As directed    FOLLOW UP WITH GASTROENTEROLOGY AS RECOMMENDED REGARDING BIOPSY RESULTS.  Please follow up with PCP in one week.     Allergies as of 02/21/2018      Reactions   Iohexol Hives, Shortness Of Breath, Itching, Nausea And Vomiting, Swelling, Other (See Comments)   Also caused chest pain PER DR BARRY, PRE MED PROTOCOL SHOULD BE ADMINISTERED PRIOR TO SCANS IN THE FUTURE.  PT HAD A SIGNIFICANT REACTION.  [facility] 08/18/17 Patient had a reaction of SOB, swelling even though premedicated. Physician states no further contrast imaging in the future.   Other Swelling   Tree nuts cause facial swelling   Peanut-containing Drug Products Swelling   Facial swelling      Medication List    TAKE these medications   albuterol 108 (90 Base) MCG/ACT inhaler Commonly known as:  PROVENTIL HFA;VENTOLIN HFA Inhale 1-2 puffs into the lungs every 6 (six) hours as needed for wheezing or shortness of breath.   ondansetron 4 MG tablet Commonly known as:  ZOFRAN Take 1 tablet (4 mg total) by mouth every 6 (six) hours as needed for nausea.   pantoprazole 40 MG tablet Commonly known as:  PROTONIX Take 1 tablet (40 mg total) by mouth daily at 6 (six) AM.   polyethylene glycol packet Commonly known as:  MIRALAX / GLYCOLAX Take 17  g by mouth daily.   traMADol 50 MG tablet Commonly known as:  ULTRAM Take 1 tablet (50 mg total) by mouth every 6 (six) hours as needed for moderate pain.      Follow-up Information    [facility] Follow up.   Why:  You may choose any of these 3 offices to get a doctor at without insurance. Contact information: [place] [address] [phone]       [facility] .   Specialty:  Internal Medicine Contact information: [address] [place] [address] [phone]       [facility] Follow up.   Why:  Once you make an appointment at 1 of these 3 clinics, you can use this location for your PHARMACY needs and to make a FINANCIAL COUSELING appointment. Contact information: [address] [place] [address] [phone]         Allergies  Allergen Reactions  . Iohexol Hives, Shortness Of Breath, Itching, Nausea And Vomiting, Swelling and Other (See Comments)    Also caused chest pain PER DR BARRY, PRE MED PROTOCOL SHOULD BE ADMINISTERED PRIOR TO SCANS IN THE FUTURE.  PT HAD A SIGNIFICANT REACTION.  [facility]  08/18/17 Patient had a reaction of SOB, swelling even though premedicated. Physician states no further contrast imaging in the future.  . Other Swelling    Tree nuts cause facial swelling  . Peanut-Containing Drug Products Swelling    Facial swelling    Consultations:  Gastroenterology.    Procedures/Studies: Ct Abdomen Pelvis Wo Contrast  Result Date: 02/19/2018 CLINICAL DATA:  27 y/o F; 2 months of throwing up blood and central chest tightness with 1 week of cough and throat pain. Also centralized abdominal pain around the umbilicus and constipation. EXAM: CT ABDOMEN AND PELVIS WITHOUT CONTRAST TECHNIQUE: Multidetector CT imaging of the abdomen and pelvis was performed following the standard protocol without IV contrast. COMPARISON:  08/18/2017 CT abdomen and pelvis. FINDINGS: Lower chest: No acute abnormality. Hepatobiliary: No focal liver abnormality is seen. No gallstones, gallbladder wall thickening, or biliary dilatation. Pancreas: Unremarkable. No pancreatic ductal dilatation or surrounding inflammatory changes. Spleen: Normal in size without focal abnormality. Adrenals/Urinary Tract:  Adrenal glands are unremarkable. Right kidney upper pole punctate nonobstructing stone. Kidneys are otherwise normal, without renal calculi, focal lesion, or hydronephrosis. Bladder is unremarkable. Stomach/Bowel: Stomach is within normal limits. Appendix appears normal. No evidence of bowel wall thickening, distention, or inflammatory changes. Vascular/Lymphatic: No significant vascular findings are present. No enlarged abdominal or pelvic lymph nodes. Reproductive: Uterus and bilateral adnexa are unremarkable. Other: No abdominal wall hernia or abnormality. No abdominopelvic ascites. Musculoskeletal: No fracture is seen. IMPRESSION: 1. No acute process identified. 2. Punctate right kidney upper pole nonobstructing stone. Electronically Signed   By: Mitzi Hansen M.D.   On: 02/19/2018 00:14   Dg Chest 2 View  Result Date: 02/18/2018 CLINICAL DATA:  Chest pain short of breath EXAM: CHEST - 2 VIEW COMPARISON:  09/10/2017 FINDINGS: The heart size and mediastinal contours are within normal limits. Both lungs are clear. The visualized skeletal structures are unremarkable. IMPRESSION: No active cardiopulmonary disease. Electronically Signed   By: Jasmine Pang M.D.   On: 02/18/2018 14:58       Subjective: No chest pain or sob, no nausea, or vomiting.   Discharge Exam: Vitals:   02/20/18 2147 02/21/18 0649  BP: 116/85 100/60  Pulse: 84 68  Resp: 18 18  Temp: 98.4 F (36.9 C) 98.6 F (37 C)  SpO2: 98% 100%   Vitals:   02/20/18 1201 02/20/18 1400 02/20/18 2147 02/21/18 0649  BP: 113/81 (!) 99/58 116/85 100/60  Pulse: 79 85 84 68  Resp: 18 18 18 18   Temp: 98.2 F (36.8 C) 98.9 F (37.2 C) 98.4 F (36.9 C) 98.6 F (37 C)  TempSrc: Oral Oral Oral Oral  SpO2: 100% 99% 98% 100%  Weight:      Height:        General: Pt is alert, awake, not in acute distress Cardiovascular: RRR, S1/S2 +, no rubs, no gallops Respiratory: CTA bilaterally, no wheezing, no rhonchi Abdominal: Soft,  NT, ND, bowel sounds + Extremities: no edema, no cyanosis    The results of significant diagnostics from this hospitalization (including imaging, microbiology, ancillary and laboratory) are listed below for reference.     Microbiology: No results found for this or any previous visit (from the past 240 hour(s)).   Labs: BNP (last 3 results) No results for input(s): BNP in the last 8760 hours. Basic Metabolic Panel: Recent Labs  Lab 02/18/18 1403 02/19/18 0922 02/20/18 0547  NA 139 139 138  K 4.1 3.3* 3.5  CL 106 107 105  CO2 26 24 27   GLUCOSE 94 84 87  BUN 14 13 8   CREATININE 0.64 0.77 0.70  CALCIUM 9.4 8.7* 8.7*   Liver Function Tests: Recent Labs  Lab 02/18/18 1403  AST 19  ALT 16  ALKPHOS 71  BILITOT 0.5  PROT 7.8  ALBUMIN 4.0   Recent Labs  Lab 02/18/18 1403  LIPASE 22   No results for input(s): AMMONIA in the last 168 hours. CBC: Recent Labs  Lab 02/18/18 1403 02/19/18 0922 02/20/18 0547  WBC 7.8 4.8 4.9  HGB 12.9 11.6* 11.8*  HCT 40.0 35.4* 37.0  MCV 82.3 82.1 83.3  PLT 260 216 233   Cardiac Enzymes: No results for input(s): CKTOTAL, CKMB, CKMBINDEX, TROPONINI in the last 168 hours. BNP: Invalid input(s): POCBNP CBG: No results for input(s): GLUCAP in the last 168 hours. D-Dimer No results for input(s): DDIMER in the last 72 hours. Hgb A1c No results for input(s): HGBA1C in the last 72 hours. Lipid Profile No results for input(s): CHOL, HDL, LDLCALC, TRIG, CHOLHDL, LDLDIRECT in the last 72 hours. Thyroid function studies No results for input(s): TSH, T4TOTAL, T3FREE, THYROIDAB in the last 72 hours.  Invalid input(s): FREET3 Anemia work up No results for input(s): VITAMINB12, FOLATE, FERRITIN, TIBC, IRON, RETICCTPCT in the last 72 hours. Urinalysis    Component Value Date/Time   COLORURINE YELLOW 02/18/2018 2156   APPEARANCEUR CLEAR 02/18/2018 2156   LABSPEC 1.024 02/18/2018 2156   PHURINE 6.0 02/18/2018 2156   GLUCOSEU NEGATIVE  02/18/2018 2156   HGBUR NEGATIVE 02/18/2018 2156   BILIRUBINUR NEGATIVE 02/18/2018 2156   KETONESUR 20 (A) 02/18/2018 2156   PROTEINUR NEGATIVE 02/18/2018 2156   UROBILINOGEN 0.2 09/16/2015 1553   NITRITE NEGATIVE 02/18/2018 2156   LEUKOCYTESUR TRACE (A) 02/18/2018 2156   Sepsis Labs Invalid input(s): PROCALCITONIN,  WBC,  LACTICIDVEN Microbiology No results found for this or any previous visit (from the past 240 hour(s)).   Time coordinating discharge: Over 30 minutes  SIGNED:   Kathlen ModyVijaya Sandon Yoho, MD  [facility] 02/24/2018, 1:32 PM Pager   If 7PM-7AM, please contact night-coverage [email or web address] Password [email or web address]

## 2018-05-25 ENCOUNTER — Inpatient Hospital Stay (HOSPITAL_COMMUNITY)
Admission: EM | Admit: 2018-05-25 | Discharge: 2018-05-29 | DRG: 915 | Disposition: A | Discharge status: Home / Self Care | Payer: Medicaid Other | Attending: Internal Medicine | Admitting: Internal Medicine

## 2018-05-25 ENCOUNTER — Emergency Department (HOSPITAL_COMMUNITY): Payer: Medicaid Other

## 2018-05-25 ENCOUNTER — Inpatient Hospital Stay (HOSPITAL_COMMUNITY): Payer: Medicaid Other

## 2018-05-25 DIAGNOSIS — Z8249 Family history of ischemic heart disease and other diseases of the circulatory system: Secondary | ICD-10-CM

## 2018-05-25 DIAGNOSIS — Z888 Allergy status to other drugs, medicaments and biological substances status: Secondary | ICD-10-CM | POA: Diagnosis not present

## 2018-05-25 DIAGNOSIS — Z823 Family history of stroke: Secondary | ICD-10-CM | POA: Diagnosis not present

## 2018-05-25 DIAGNOSIS — J45909 Unspecified asthma, uncomplicated: Secondary | ICD-10-CM | POA: Diagnosis present

## 2018-05-25 DIAGNOSIS — Z79891 Long term (current) use of opiate analgesic: Secondary | ICD-10-CM | POA: Diagnosis not present

## 2018-05-25 DIAGNOSIS — Z79899 Other long term (current) drug therapy: Secondary | ICD-10-CM | POA: Diagnosis not present

## 2018-05-25 DIAGNOSIS — Z882 Allergy status to sulfonamides status: Secondary | ICD-10-CM

## 2018-05-25 DIAGNOSIS — X58XXXA Exposure to other specified factors, initial encounter: Secondary | ICD-10-CM | POA: Diagnosis present

## 2018-05-25 DIAGNOSIS — K922 Gastrointestinal hemorrhage, unspecified: Secondary | ICD-10-CM | POA: Diagnosis not present

## 2018-05-25 DIAGNOSIS — Z91018 Allergy to other foods: Secondary | ICD-10-CM

## 2018-05-25 DIAGNOSIS — J9621 Acute and chronic respiratory failure with hypoxia: Secondary | ICD-10-CM | POA: Diagnosis not present

## 2018-05-25 DIAGNOSIS — F329 Major depressive disorder, single episode, unspecified: Secondary | ICD-10-CM | POA: Diagnosis present

## 2018-05-25 DIAGNOSIS — J9801 Acute bronchospasm: Secondary | ICD-10-CM | POA: Diagnosis not present

## 2018-05-25 DIAGNOSIS — Z87891 Personal history of nicotine dependence: Secondary | ICD-10-CM

## 2018-05-25 DIAGNOSIS — K2970 Gastritis, unspecified, without bleeding: Secondary | ICD-10-CM | POA: Diagnosis present

## 2018-05-25 DIAGNOSIS — T783XXA Angioneurotic edema, initial encounter: Secondary | ICD-10-CM | POA: Diagnosis present

## 2018-05-25 DIAGNOSIS — K920 Hematemesis: Secondary | ICD-10-CM | POA: Diagnosis not present

## 2018-05-25 DIAGNOSIS — J9601 Acute respiratory failure with hypoxia: Secondary | ICD-10-CM | POA: Diagnosis present

## 2018-05-25 DIAGNOSIS — T7801XA Anaphylactic reaction due to peanuts, initial encounter: Principal | ICD-10-CM | POA: Diagnosis present

## 2018-05-25 DIAGNOSIS — Z91010 Allergy to peanuts: Secondary | ICD-10-CM | POA: Diagnosis not present

## 2018-05-25 DIAGNOSIS — R34 Anuria and oliguria: Secondary | ICD-10-CM | POA: Diagnosis present

## 2018-05-25 DIAGNOSIS — T782XXA Anaphylactic shock, unspecified, initial encounter: Secondary | ICD-10-CM

## 2018-05-25 DIAGNOSIS — Z0189 Encounter for other specified special examinations: Secondary | ICD-10-CM

## 2018-05-25 DIAGNOSIS — Z01818 Encounter for other preprocedural examination: Secondary | ICD-10-CM

## 2018-05-25 DIAGNOSIS — K254 Chronic or unspecified gastric ulcer with hemorrhage: Secondary | ICD-10-CM | POA: Diagnosis present

## 2018-05-25 DIAGNOSIS — G43909 Migraine, unspecified, not intractable, without status migrainosus: Secondary | ICD-10-CM | POA: Diagnosis present

## 2018-05-25 DIAGNOSIS — T7801XD Anaphylactic reaction due to peanuts, subsequent encounter: Secondary | ICD-10-CM | POA: Diagnosis not present

## 2018-05-25 HISTORY — DX: Anaphylactic reaction due to peanuts, initial encounter: T78.01XA

## 2018-05-25 LAB — CBC WITH DIFFERENTIAL/PLATELET
ABS IMMATURE GRANULOCYTES: 0 10*3/uL (ref 0.0–0.1)
BASOS ABS: 0 10*3/uL (ref 0.0–0.1)
Basophils Relative: 1 %
Eosinophils Absolute: 0.2 10*3/uL (ref 0.0–0.7)
Eosinophils Relative: 3 %
HCT: 40.9 % (ref 36.0–46.0)
HEMOGLOBIN: 12.8 g/dL (ref 12.0–15.0)
IMMATURE GRANULOCYTES: 0 %
LYMPHS PCT: 37 %
Lymphs Abs: 2.5 10*3/uL (ref 0.7–4.0)
MCH: 26.6 pg (ref 26.0–34.0)
MCHC: 31.3 g/dL (ref 30.0–36.0)
MCV: 84.9 fL (ref 78.0–100.0)
Monocytes Absolute: 0.6 10*3/uL (ref 0.1–1.0)
Monocytes Relative: 9 %
NEUTROS ABS: 3.4 10*3/uL (ref 1.7–7.7)
NEUTROS PCT: 50 %
Platelets: 272 10*3/uL (ref 150–400)
RBC: 4.82 MIL/uL (ref 3.87–5.11)
RDW: 14.4 % (ref 11.5–15.5)
WBC: 6.7 10*3/uL (ref 4.0–10.5)

## 2018-05-25 LAB — BASIC METABOLIC PANEL
ANION GAP: 11 (ref 5–15)
BUN: 14 mg/dL (ref 6–20)
CHLORIDE: 105 mmol/L (ref 98–111)
CO2: 22 mmol/L (ref 22–32)
Calcium: 8.9 mg/dL (ref 8.9–10.3)
Creatinine, Ser: 0.77 mg/dL (ref 0.44–1.00)
GFR calc Af Amer: >60 mL/min (ref >60)
Glucose, Bld: 112 mg/dL — ABNORMAL HIGH (ref 70–99)
POTASSIUM: 2.7 mmol/L — AB (ref 3.5–5.1)
SODIUM: 138 mmol/L (ref 135–145)

## 2018-05-25 LAB — I-STAT ARTERIAL BLOOD GAS, ED
Acid-base deficit: 3 mmol/L — ABNORMAL HIGH (ref 0.0–2.0)
BICARBONATE: 22.9 mmol/L (ref 20.0–28.0)
O2 Saturation: 100 %
Patient temperature: 99.2
TCO2: 24 mmol/L (ref 22–32)
pCO2 arterial: 44.5 mmHg (ref 32.0–48.0)
pH, Arterial: 7.321 — ABNORMAL LOW (ref 7.350–7.450)
pO2, Arterial: 487 mmHg — ABNORMAL HIGH (ref 83.0–108.0)

## 2018-05-25 LAB — GLUCOSE, CAPILLARY: Glucose-Capillary: 113 mg/dL — ABNORMAL HIGH (ref 70–99)

## 2018-05-25 LAB — I-STAT BETA HCG BLOOD, ED (MC, WL, AP ONLY): I-stat hCG, quantitative: <5 m[IU]/mL (ref <5)

## 2018-05-25 LAB — MRSA PCR SCREENING: MRSA by PCR: NEGATIVE

## 2018-05-25 MED ORDER — METHYLPREDNISOLONE SODIUM SUCC 40 MG IJ SOLR
40.0000 mg | Freq: Four times a day (QID) | INTRAMUSCULAR | Status: DC
  Administered 2018-05-25 – 2018-05-27 (×7): 40 mg via INTRAVENOUS
  Filled 2018-05-25 (×7): qty 1

## 2018-05-25 MED ORDER — FENTANYL 2500MCG IN NS 250ML (10MCG/ML) PREMIX INFUSION
0.0000 ug/h | INTRAVENOUS | Status: DC
  Administered 2018-05-25: 50 ug/h via INTRAVENOUS
  Administered 2018-05-26: 300 ug/h via INTRAVENOUS
  Filled 2018-05-25 (×3): qty 250

## 2018-05-25 MED ORDER — DIPHENHYDRAMINE HCL 50 MG/ML IJ SOLN
25.0000 mg | Freq: Four times a day (QID) | INTRAMUSCULAR | Status: DC
  Administered 2018-05-25 – 2018-05-27 (×7): 25 mg via INTRAVENOUS
  Filled 2018-05-25 (×7): qty 1

## 2018-05-25 MED ORDER — FENTANYL CITRATE (PF) 100 MCG/2ML IJ SOLN
INTRAMUSCULAR | Status: AC
  Filled 2018-05-25: qty 2

## 2018-05-25 MED ORDER — SODIUM CHLORIDE 0.9 % IV SOLN
Freq: Once | INTRAVENOUS | Status: AC
  Administered 2018-05-25: 16:00:00 via INTRAVENOUS

## 2018-05-25 MED ORDER — FENTANYL 2500MCG IN NS 250ML (10MCG/ML) PREMIX INFUSION: 0.0000 ug/h | INTRAVENOUS | Status: DC

## 2018-05-25 MED ORDER — SODIUM CHLORIDE 0.9 % IV BOLUS
1000.0000 mL | Freq: Once | INTRAVENOUS | Status: AC
  Administered 2018-05-25: 1000 mL via INTRAVENOUS

## 2018-05-25 MED ORDER — EPINEPHRINE 0.3 MG/0.3ML IJ SOAJ: 0.3000 mg | Freq: Once | INTRAMUSCULAR | Status: DC

## 2018-05-25 MED ORDER — PROPOFOL 1000 MG/100ML IV EMUL
INTRAVENOUS | Status: AC
  Filled 2018-05-25: qty 100

## 2018-05-25 MED ORDER — ORAL CARE MOUTH RINSE
15.0000 mL | OROMUCOSAL | Status: DC
  Administered 2018-05-25 – 2018-05-26 (×5): 15 mL via OROMUCOSAL

## 2018-05-25 MED ORDER — SODIUM CHLORIDE 0.9 % IV SOLN
0.5000 mg/h | INTRAVENOUS | Status: DC
  Administered 2018-05-25: 0.5 mg/h via INTRAVENOUS
  Administered 2018-05-25: 5 mg/h via INTRAVENOUS
  Administered 2018-05-26: 3 mg/h via INTRAVENOUS
  Filled 2018-05-25 (×3): qty 10

## 2018-05-25 MED ORDER — MIDAZOLAM HCL 2 MG/2ML IJ SOLN
INTRAMUSCULAR | Status: AC
  Filled 2018-05-25: qty 4

## 2018-05-25 MED ORDER — FAMOTIDINE IN NACL 20-0.9 MG/50ML-% IV SOLN
20.0000 mg | Freq: Two times a day (BID) | INTRAVENOUS | Status: DC
  Administered 2018-05-25 – 2018-05-26 (×2): 20 mg via INTRAVENOUS
  Filled 2018-05-25 (×2): qty 50

## 2018-05-25 MED ORDER — EPINEPHRINE PF 1 MG/ML IJ SOLN: 1.0000 mg | INTRAMUSCULAR | Status: DC | PRN

## 2018-05-25 MED ORDER — MIDAZOLAM HCL 2 MG/2ML IJ SOLN
INTRAMUSCULAR | Status: AC
  Filled 2018-05-25: qty 2

## 2018-05-25 MED ORDER — SODIUM CHLORIDE 0.9 % IV SOLN: 0.5000 mg/h | INTRAVENOUS | Status: DC

## 2018-05-25 MED ORDER — EPINEPHRINE 0.3 MG/0.3ML IJ SOAJ
INTRAMUSCULAR | Status: AC
  Filled 2018-05-25: qty 0.3

## 2018-05-25 MED ORDER — KETAMINE HCL 10 MG/ML IJ SOLN
INTRAMUSCULAR | Status: AC | PRN
  Administered 2018-05-25: 70 mg via INTRAVENOUS

## 2018-05-25 MED ORDER — IPRATROPIUM-ALBUTEROL 0.5-2.5 (3) MG/3ML IN SOLN
3.0000 mL | RESPIRATORY_TRACT | Status: DC
  Administered 2018-05-25 – 2018-05-26 (×3): 3 mL via RESPIRATORY_TRACT
  Filled 2018-05-25 (×4): qty 3

## 2018-05-25 MED ORDER — SODIUM CHLORIDE 0.9 % IV BOLUS
500.0000 mL | Freq: Once | INTRAVENOUS | Status: AC
  Administered 2018-05-25: 500 mL via INTRAVENOUS

## 2018-05-25 MED ORDER — MIDAZOLAM HCL 5 MG/5ML IJ SOLN
4.0000 mg | Freq: Once | INTRAMUSCULAR | Status: AC
  Administered 2018-05-25 (×2): 4 mg via INTRAVENOUS

## 2018-05-25 MED ORDER — MIDAZOLAM HCL 2 MG/2ML IJ SOLN
INTRAMUSCULAR | Status: AC
  Administered 2018-05-25: 4 mg via INTRAVENOUS
  Filled 2018-05-25: qty 2

## 2018-05-25 MED ORDER — SUCCINYLCHOLINE CHLORIDE 20 MG/ML IJ SOLN
INTRAMUSCULAR | Status: AC | PRN
  Administered 2018-05-25: 120 mg via INTRAVENOUS

## 2018-05-25 MED ORDER — ACETAMINOPHEN 325 MG PO TABS: 650.0000 mg | ORAL_TABLET | ORAL | Status: DC | PRN

## 2018-05-25 MED ORDER — SODIUM CHLORIDE 0.9 % IV SOLN
40.0000 mg | Freq: Once | INTRAVENOUS | Status: DC
  Filled 2018-05-25: qty 4

## 2018-05-25 MED ORDER — KETAMINE HCL 10 MG/ML IJ SOLN: 1.0000 mg/kg | Freq: Once | INTRAMUSCULAR | Status: DC

## 2018-05-25 MED ORDER — PROPOFOL 1000 MG/100ML IV EMUL
5.0000 ug/kg/min | Freq: Once | INTRAVENOUS | Status: DC
  Administered 2018-05-25: 5 ug/kg/min via INTRAVENOUS

## 2018-05-25 MED ORDER — FENTANYL CITRATE (PF) 100 MCG/2ML IJ SOLN
INTRAMUSCULAR | Status: AC
  Administered 2018-05-25: 100 ug
  Filled 2018-05-25: qty 2

## 2018-05-25 MED ORDER — SUCCINYLCHOLINE CHLORIDE 20 MG/ML IJ SOLN
100.0000 mg | Freq: Once | INTRAMUSCULAR | Status: DC
  Filled 2018-05-25 (×2): qty 5

## 2018-05-25 MED ORDER — ONDANSETRON HCL 4 MG/2ML IJ SOLN
4.0000 mg | Freq: Four times a day (QID) | INTRAMUSCULAR | Status: DC | PRN
  Administered 2018-05-26: 4 mg via INTRAVENOUS
  Filled 2018-05-25 (×2): qty 2

## 2018-05-25 MED ORDER — HYDROMORPHONE HCL 1 MG/ML IJ SOLN
1.0000 mg | Freq: Once | INTRAMUSCULAR | Status: AC
  Administered 2018-05-25: 1 mg via INTRAVENOUS
  Filled 2018-05-25: qty 1

## 2018-05-25 MED ORDER — CHLORHEXIDINE GLUCONATE 0.12% ORAL RINSE (MEDLINE KIT)
15.0000 mL | Freq: Two times a day (BID) | OROMUCOSAL | Status: DC
  Administered 2018-05-25 – 2018-05-26 (×2): 15 mL via OROMUCOSAL

## 2018-05-25 MED ORDER — FENTANYL CITRATE (PF) 100 MCG/2ML IJ SOLN
100.0000 ug | Freq: Once | INTRAMUSCULAR | Status: AC
  Administered 2018-05-25: 100 ug via INTRAVENOUS

## 2018-05-25 MED ORDER — PROPOFOL 1000 MG/100ML IV EMUL: 5.0000 ug/kg/min | Freq: Once | INTRAVENOUS | Status: AC

## 2018-05-25 MED ORDER — SODIUM CHLORIDE 0.9 % IV SOLN
250.0000 mL | INTRAVENOUS | Status: DC | PRN
  Administered 2018-05-25: 250 mL via INTRAVENOUS

## 2018-05-25 MED ORDER — ENOXAPARIN SODIUM 30 MG/0.3ML ~~LOC~~ SOLN
30.0000 mg | Freq: Two times a day (BID) | SUBCUTANEOUS | Status: DC
  Administered 2018-05-26: 30 mg via SUBCUTANEOUS
  Filled 2018-05-25: qty 0.3

## 2018-05-25 NOTE — ED Notes (Signed)
CONTACT PERSON FOR PATIENT IS  ROSE RODRIGUEZ: [phone]
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
## 2018-05-25 NOTE — ED Notes (Signed)
ED Provider at bedside. 

## 2018-05-25 NOTE — Progress Notes (Signed)
eLink Physician-Brief Progress Note Patient Name: Remigio Eisenmengermber Blazier DOB: May 10, 1991 MRN: [id number]   Date of Service  05/25/2018  HPI/Events of Note  Oliguria - Bladder scan with > 900 mL residual.   eICU Interventions  Will order Foley catheter placed.      Intervention Category Intermediate Interventions: Oliguria - evaluation and management  Lino Wickliff Eugene 05/25/2018, 8:50 PM

## 2018-05-25 NOTE — ED Notes (Signed)
CC MD at bedside 
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
## 2018-05-25 NOTE — Progress Notes (Signed)
1600 vent changed made per CCM orders s/p ABG results.   Pt transported to 11M via vent w/ no apparent complications. Unit RT given report and aware.

## 2018-05-25 NOTE — Progress Notes (Signed)
Chaplain presence was requested for a patient transported to the ED via for breathing issues.  Chaplain provided spiritual care support for a family member and friend. Acted as Print production plannerliaison between [employer] medical and the family. Offered comfort measured, and encouragement. Also instructed them to speak to a Child psychotherapistocial Worker on the Unit where the patient will be admitted to request medication and medical provider as the patient is unable to work because of on-going illness. They were appreciative of the information, ans promised to follow up. Chaplain Janell QuietAudrey Bowdy Bair [phone]
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
## 2018-05-25 NOTE — ED Notes (Signed)
Oxygen and suction setup Patient educated and observed self-suctioning.  Will continue to re-assess

## 2018-05-26 ENCOUNTER — Encounter (HOSPITAL_COMMUNITY): Payer: Self-pay | Admitting: Pulmonary Disease

## 2018-05-26 DIAGNOSIS — T7801XA Anaphylactic reaction due to peanuts, initial encounter: Secondary | ICD-10-CM

## 2018-05-26 HISTORY — DX: Anaphylactic reaction due to peanuts, initial encounter: T78.01XA

## 2018-05-26 LAB — POCT I-STAT 3, ART BLOOD GAS (G3+)
ACID-BASE DEFICIT: 2 mmol/L (ref 0.0–2.0)
BICARBONATE: 22.8 mmol/L (ref 20.0–28.0)
O2 Saturation: 99 %
PH ART: 7.373 (ref 7.350–7.450)
TCO2: 24 mmol/L (ref 22–32)
pCO2 arterial: 39.1 mmHg (ref 32.0–48.0)
pO2, Arterial: 153 mmHg — ABNORMAL HIGH (ref 83.0–108.0)

## 2018-05-26 LAB — CBC
HEMATOCRIT: 38.1 % (ref 36.0–46.0)
HEMOGLOBIN: 12.1 g/dL (ref 12.0–15.0)
MCH: 26.6 pg (ref 26.0–34.0)
MCHC: 31.8 g/dL (ref 30.0–36.0)
MCV: 83.7 fL (ref 78.0–100.0)
PLATELETS: 243 10*3/uL (ref 150–400)
RBC: 4.55 MIL/uL (ref 3.87–5.11)
RDW: 14.3 % (ref 11.5–15.5)
WBC: 6.9 10*3/uL (ref 4.0–10.5)

## 2018-05-26 LAB — BASIC METABOLIC PANEL
ANION GAP: 6 (ref 5–15)
BUN: 8 mg/dL (ref 6–20)
CHLORIDE: 109 mmol/L (ref 98–111)
CO2: 22 mmol/L (ref 22–32)
Calcium: 8.8 mg/dL — ABNORMAL LOW (ref 8.9–10.3)
Creatinine, Ser: 0.77 mg/dL (ref 0.44–1.00)
GFR calc non Af Amer: >60 mL/min (ref >60)
Glucose, Bld: 144 mg/dL — ABNORMAL HIGH (ref 70–99)
POTASSIUM: 3.7 mmol/L (ref 3.5–5.1)
SODIUM: 137 mmol/L (ref 135–145)

## 2018-05-26 LAB — MAGNESIUM: Magnesium: 2.1 mg/dL (ref 1.7–2.4)

## 2018-05-26 LAB — PHOSPHORUS: Phosphorus: 3 mg/dL (ref 2.5–4.6)

## 2018-05-26 MED ORDER — SODIUM CHLORIDE 0.9 % IV SOLN
INTRAVENOUS | Status: DC
  Administered 2018-05-26 – 2018-05-27 (×2): via INTRAVENOUS

## 2018-05-26 MED ORDER — SODIUM CHLORIDE 0.9 % IV SOLN
80.0000 mg | Freq: Once | INTRAVENOUS | Status: AC
  Administered 2018-05-26: 80 mg via INTRAVENOUS
  Filled 2018-05-26: qty 80

## 2018-05-26 MED ORDER — ALBUTEROL SULFATE (2.5 MG/3ML) 0.083% IN NEBU: 2.5000 mg | INHALATION_SOLUTION | RESPIRATORY_TRACT | Status: DC | PRN

## 2018-05-26 MED ORDER — HEPARIN SODIUM (PORCINE) 5000 UNIT/ML IJ SOLN
5000.0000 [IU] | Freq: Three times a day (TID) | INTRAMUSCULAR | Status: DC
  Administered 2018-05-26: 5000 [IU] via SUBCUTANEOUS
  Filled 2018-05-26: qty 1

## 2018-05-26 MED ORDER — FENTANYL CITRATE (PF) 100 MCG/2ML IJ SOLN
25.0000 ug | INTRAMUSCULAR | Status: DC | PRN
  Administered 2018-05-28 – 2018-05-29 (×3): 25 ug via INTRAVENOUS
  Filled 2018-05-26 (×3): qty 2

## 2018-05-26 MED ORDER — PANTOPRAZOLE SODIUM 40 MG IV SOLR: 40.0000 mg | Freq: Two times a day (BID) | INTRAVENOUS | Status: DC

## 2018-05-26 MED ORDER — SODIUM CHLORIDE 0.9 % IV SOLN
8.0000 mg/h | INTRAVENOUS | Status: DC
  Administered 2018-05-26 – 2018-05-28 (×6): 8 mg/h via INTRAVENOUS
  Filled 2018-05-26 (×10): qty 80

## 2018-05-26 MED ORDER — TRAMADOL HCL 50 MG PO TABS
50.0000 mg | ORAL_TABLET | Freq: Four times a day (QID) | ORAL | Status: DC | PRN
  Administered 2018-05-26 – 2018-05-29 (×4): 50 mg via ORAL
  Filled 2018-05-26 (×4): qty 1

## 2018-05-26 MED ORDER — PHENOL 1.4 % MT LIQD
1.0000 | OROMUCOSAL | Status: DC | PRN
  Administered 2018-05-26: 1 via OROMUCOSAL
  Filled 2018-05-26: qty 177
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
## 2018-05-26 NOTE — Care Management Note (Signed)
Case Management Note  Patient Details  Name: [name] [name] MRN: [id number] Date of Birth: 1991/10/31  Subjective/Objective:   From home, presents with anaphlaxis , gib, no insurance or pcp, she has scheduled f/[facility] on [facility]/29 at 3:10 and she will be able to use the [facility] for medication assistance, but they will be closed on 7/4, may need Match letter at dc.                  Action/Plan: NCM will follow for dc needs.   Expected Discharge Date:                  Expected Discharge Plan:  Home/Self Care  In-House Referral:     Discharge planning Services  CM Consult, Follow-up appt scheduled, Indigent [facility] Clinic, Medication Assistance  Post Acute Care Choice:    Choice offered to:     DME Arranged:    DME Agency:     HH Arranged:    HH Agency:     Status of Service:  In process, will continue to follow  If discussed at Long Length of Stay Meetings, dates discussed:    Additional Comments:  Leone Havenaylor, Natanya Holecek Clinton, RN 05/26/2018, 1:37 PM

## 2018-05-26 NOTE — Progress Notes (Signed)
PULMONARY / CRITICAL CARE MEDICINE   Name: [name] [name] MRN: [id number] DOB: [birth year]    ADMISSION DATE:  05/25/2018  REFERRING MD:  Leary Roca, ER  CHIEF COMPLAINT:  Swelling  HISTORY OF PRESENT ILLNESS:   27 yo female with anaphylaxis from exposure to peanut.  Required intubation for airway management.  PAST MEDICAL HISTORY :  Asthma, Depression, HA  SUBJECTIVE:  Required sedation overnight.  VITAL SIGNS: BP 99/64   Pulse 60   Temp 98.6 F (37 C) (Axillary)   Resp 20   Ht 5\' 4"  (1.626 m)   Wt 137 lb 2 oz (62.2 kg)   SpO2 99%   BMI 23.54 kg/m   VENTILATOR SETTINGS: Vent Mode: PRVC FiO2 (%):  [40 %-100 %] 40 % Set Rate:  [16 bmp-20 bmp] 20 bmp Vt Set:  [400 mL-500 mL] 400 mL PEEP:  [5 cmH20] 5 cmH20 Plateau Pressure:  [16 cmH20-18 cmH20] 16 cmH20  INTAKE / OUTPUT: I/O last 3 completed shifts: In: 1123.3 [I.V.:573.3; IV Piggyback:550] Out: 2105 [Urine:2105]  PHYSICAL EXAMINATION:  General - pleasant Eyes - pupils reactive ENT - ETT in place, no lip/tongue swelling Cardiac - regular, no murmur Chest - no wheeze, rales Abd - soft, non tender Ext - no edema Skin - no rashes Neuro - RASS 0, follows commands   LABS:  BMET Recent Labs  Lab 05/25/18 1242 05/26/18 0403  NA 138 137  K 2.7* 3.7  CL 105 109  CO2 22 22  BUN 14 8  CREATININE 0.77 0.77  GLUCOSE 112* 144*    Electrolytes Recent Labs  Lab 05/25/18 1242 05/26/18 0403  CALCIUM 8.9 8.8*  MG  --  2.1  PHOS  --  3.0    CBC Recent Labs  Lab 05/25/18 1242 05/26/18 0403  WBC 6.7 6.9  HGB 12.8 12.1  HCT 40.9 38.1  PLT 272 243    Coag's No results for input(s): APTT, INR in the last 168 hours.  Sepsis Markers No results for input(s): LATICACIDVEN, PROCALCITON, O2SATVEN in the last 168 hours.  ABG Recent Labs  Lab 05/25/18 1532 05/26/18 0345  PHART 7.321* 7.373  PCO2ART 44.5 39.1  PO2ART 487.0* 153.0*    Liver Enzymes No results for input(s): AST, ALT,  ALKPHOS, BILITOT, ALBUMIN in the last 168 hours.  Cardiac Enzymes No results for input(s): TROPONINI, PROBNP in the last 168 hours.  Glucose Recent Labs  Lab 05/25/18 1938  GLUCAP 113*    Imaging Dg Chest Port 1 View  Result Date: 05/25/2018 CLINICAL DATA:  Patient status post intubation EXAM: PORTABLE CHEST 1 VIEW COMPARISON:  Chest radiograph 02/18/2018 FINDINGS: ET tube terminates in the mid trachea. Monitoring leads overlie the patient. Stable cardiac and mediastinal contours. Bibasilar atelectasis. No pleural effusion or pneumothorax. IMPRESSION: ET tube terminates in the mid trachea. Electronically Signed   By: Annia Belt M.D.   On: 05/25/2018 15:23   Dg Abd Portable 1v  Result Date: 05/25/2018 CLINICAL DATA:  27 year old female with enteric tube placement. EXAM: PORTABLE ABDOMEN - 1 VIEW COMPARISON:  CT of the abdomen pelvis dated 02/18/2018 FINDINGS: Partially visualized enteric tube with side port in the body of the stomach and tip in the distal stomach. No dilatation of the small bowel. Air is noted throughout the colon. The osseous structures and soft tissues appear unremarkable. IMPRESSION: Enteric tube with tip in the distal stomach. Electronically Signed   By: Elgie Collard M.D.   On: 05/25/2018 21:59    SIGNIFICANT EVENTS: 6/30  Admit  LINES/TUBES: ETT 6/30 >> 7/01  DISCUSSION: 27 yo female anaphylaxis from exposure to peanut.  Swelling down, and tolerating SBT.  ASSESSMENT / PLAN:  Acute hypoxic respiratory failure with compromised airway. - extubate 7/01  Anaphylaxis from peanut exposure. Hx of asthma. - solumedrol, pepcid, benadryl - prn albuterol  Gastritis with upper GI bleed in May 2019. Functional nausea. - seen by GI during last admission in May 2019 - prn zofran - continue pepcid  DVT prophylaxis - SQ heparin SUP - pepcid Nutrition - regular diet Goals of care - full code  CC time 32 minutes  Coralyn HellingVineet Lauralynn Loeb, MD [facility] Pulmonary/Critical  Care 05/26/2018, 7:47 AM

## 2018-05-26 NOTE — ED Provider Notes (Signed)
Medical screening examination/treatment/procedure(s) were conducted as a shared visit with non-physician practitioner(s) and myself.  I personally evaluated the patient during the encounter. Briefly, the patient is a 27 yo F here with tongue swelling, urticaria, wheezing, stridor after nut exposure. H/o anaphylaxis. On exam, pt with stridor, increased WOb, drooling. Decision made to intubate for airway protection as pt endorses worsening sx despite epi x 3. Pt intubated as below. Tolerated well. Admit to intensivist.   Procedure Name: Intubation Date/Time: 05/26/2018 1:16 PM Performed by: Shaune PollackIsaacs, Donzella Carrol, MD Pre-anesthesia Checklist: Patient identified, Patient being monitored, Emergency Drugs available, Timeout performed and Suction available Oxygen Delivery Method: Non-rebreather mask Preoxygenation: Pre-oxygenation with 100% oxygen Induction Type: Rapid sequence Ventilation: Mask ventilation without difficulty Laryngoscope Size: Glidescope and 3 Grade View: Grade I Tube size: 6.5 mm Number of attempts: 1 Airway Equipment and Method: Rigid stylet Placement Confirmation: ETT inserted through vocal cords under direct vision,  CO2 detector and Breath sounds checked- equal and bilateral Difficulty Due To: Difficult Airway-  due to edematous airway Future Recommendations: Recommend- induction with short-acting agent, and alternative techniques readily available Comments: Significant edema noted to arytenoids, epiglottis. 6.5 tube passed without difficulty. Moderate edema of posterior tongue noted as well. Consistent with anaphylaxis.         Shaune PollackIsaacs, Lenora Gomes, MD 05/26/18 [phone]

## 2018-05-26 NOTE — Progress Notes (Addendum)
Noted to have Upper GI bleed by RN.  Will start protonix gtt, and consult GI >> was seen by [facility] during last admission.  [name] HellingVineet Keilee Denman, MD [facility] Pulmonary/Critical Care 05/26/2018, 11:57 AM  Spoke with Jennye MoccasinSarah Gribbin.  Advised that pt was previously seen by [facility] and does not have outpt relation with [facility].  [facility] is on for unassigned patients.  Have placed call for [facility] to consult on pt.  [name] HellingVineet Spiro Ausborn, MD [facility] Pulmonary/Critical Care 05/26/2018, 12:27 PM

## 2018-05-26 NOTE — Progress Notes (Signed)
Text page with return call from DR [facility] Healthcareood to make aware of pt having thrown up approx 75cc of BRB.  Pt says she has done this usually twice a day for months & other DR's can't find anything wrong with her - Md says he will call GI for consult

## 2018-05-26 NOTE — Procedures (Signed)
Extubation Procedure Note  Patient Details:   Name: Remigio Eisenmengermber Lamontagne DOB: Aug 20, 1991 MRN: [id number]   Airway Documentation:    Vent end date: 05/26/18 Vent end time: 0845   Evaluation  O2 sats: stable throughout Complications: No apparent complications Patient did tolerate procedure well. Bilateral Breath Sounds: Clear, Diminished   Yes   Pt extubated to 2L [place] per MD order. Positive cuff leak. No evidence of stridor. PT able to speak post extubation. Vitals are stable and RT will continue to monitor.   Lura EmKaelyn D Thompson 05/26/2018, 8:49 AM

## 2018-05-27 ENCOUNTER — Other Ambulatory Visit: Payer: Self-pay | Admitting: Pulmonary Disease

## 2018-05-27 ENCOUNTER — Inpatient Hospital Stay (HOSPITAL_COMMUNITY): Payer: Medicaid Other | Admitting: Certified Registered"

## 2018-05-27 ENCOUNTER — Encounter (HOSPITAL_COMMUNITY)
Admission: EM | Disposition: A | Discharge status: Home / Self Care | Payer: Self-pay | Source: Home / Self Care | Attending: Pulmonary Disease

## 2018-05-27 ENCOUNTER — Encounter (HOSPITAL_COMMUNITY): Payer: Self-pay

## 2018-05-27 HISTORY — PX: ESOPHAGOGASTRODUODENOSCOPY (EGD) WITH PROPOFOL: SHX5813

## 2018-05-27 HISTORY — PX: BIOPSY: SHX5522

## 2018-05-27 LAB — BASIC METABOLIC PANEL
ANION GAP: 5 (ref 5–15)
BUN: 9 mg/dL (ref 6–20)
CHLORIDE: 106 mmol/L (ref 98–111)
CO2: 25 mmol/L (ref 22–32)
Calcium: 8.4 mg/dL — ABNORMAL LOW (ref 8.9–10.3)
Creatinine, Ser: 0.64 mg/dL (ref 0.44–1.00)
GFR calc Af Amer: >60 mL/min (ref >60)
GFR calc non Af Amer: >60 mL/min (ref >60)
GLUCOSE: 123 mg/dL — AB (ref 70–99)
POTASSIUM: 3.8 mmol/L (ref 3.5–5.1)
Sodium: 136 mmol/L (ref 135–145)

## 2018-05-27 LAB — CBC
HCT: 35.8 % — ABNORMAL LOW (ref 36.0–46.0)
HEMOGLOBIN: 11.2 g/dL — AB (ref 12.0–15.0)
MCH: 26.8 pg (ref 26.0–34.0)
MCHC: 31.3 g/dL (ref 30.0–36.0)
MCV: 85.6 fL (ref 78.0–100.0)
Platelets: 237 10*3/uL (ref 150–400)
RBC: 4.18 MIL/uL (ref 3.87–5.11)
RDW: 14.6 % (ref 11.5–15.5)
WBC: 16 10*3/uL — ABNORMAL HIGH (ref 4.0–10.5)

## 2018-05-27 SURGERY — ESOPHAGOGASTRODUODENOSCOPY (EGD) WITH PROPOFOL
Anesthesia: Monitor Anesthesia Care

## 2018-05-27 MED ORDER — DIPHENHYDRAMINE HCL 50 MG/ML IJ SOLN: 25.0000 mg | Freq: Four times a day (QID) | INTRAMUSCULAR | Status: DC | PRN

## 2018-05-27 MED ORDER — EPINEPHRINE 0.15 MG/0.15ML IJ SOAJ: 0.1500 mg | INTRAMUSCULAR | 1 refills | Status: DC | PRN

## 2018-05-27 MED ORDER — PROPOFOL 500 MG/50ML IV EMUL
INTRAVENOUS | Status: DC | PRN
  Administered 2018-05-27: 200 ug/kg/min via INTRAVENOUS

## 2018-05-27 MED ORDER — PROPOFOL 10 MG/ML IV BOLUS
INTRAVENOUS | Status: DC | PRN
  Administered 2018-05-27: 30 mg via INTRAVENOUS
  Administered 2018-05-27: 10 mg via INTRAVENOUS
  Administered 2018-05-27: 20 mg via INTRAVENOUS

## 2018-05-27 MED ORDER — METHYLPREDNISOLONE SODIUM SUCC 40 MG IJ SOLR
40.0000 mg | Freq: Two times a day (BID) | INTRAMUSCULAR | Status: DC
  Administered 2018-05-27 – 2018-05-28 (×3): 40 mg via INTRAVENOUS
  Filled 2018-05-27 (×2): qty 1

## 2018-05-27 MED FILL — EPINEPHRINE 0.3 MG AUTO-INJ: 0.3 | 25 days supply | Qty: 2 | Fill #0

## 2018-05-27 SURGICAL SUPPLY — 15 items

## 2018-05-27 NOTE — Transfer of Care (Signed)
Immediate Anesthesia Transfer of Care Note  Patient: [name] [name]  Procedure(s) Performed: ESOPHAGOGASTRODUODENOSCOPY (EGD) WITH PROPOFOL (N/A ) BIOPSY  Patient Location: Endoscopy Unit  Anesthesia Type:MAC  Level of Consciousness: sedated  Airway & Oxygen Therapy: Patient Spontanous Breathing and Patient connected to nasal cannula oxygen  Post-op Assessment: Report given to RN, Post -op Vital signs reviewed and stable and Patient moving all extremities  Post vital signs: Reviewed and stable  Last Vitals:  Vitals Value Taken Time  BP    Temp    Pulse 54 05/27/2018 10:34 AM  Resp 19 05/27/2018 10:34 AM  SpO2 100 % 05/27/2018 10:34 AM  Vitals shown include unvalidated device data.  Last Pain:  Vitals:   05/27/18 0928  TempSrc: Oral  PainSc: 0-No pain      Patients Stated Pain Goal: 0 (01/75/10 2585)  Complications: No apparent anesthesia complications

## 2018-05-27 NOTE — Anesthesia Preprocedure Evaluation (Addendum)
Anesthesia Evaluation  Patient identified by MRN, date of birth, ID band Patient awake    Reviewed: Allergy & Precautions, NPO status , Patient's Chart, lab work & pertinent test results  Airway Mallampati: II  TM Distance: >3 FB Neck ROM: Full    Dental no notable dental hx. (+) Teeth Intact, Caps   Pulmonary neg pulmonary ROS, asthma , former smoker,    Pulmonary exam normal breath sounds clear to auscultation       Cardiovascular negative cardio ROS Normal cardiovascular exam Rhythm:Regular Rate:Normal     Neuro/Psych    GI/Hepatic negative GI ROS, Neg liver ROS,   Endo/Other  negative endocrine ROS  Renal/GU negative Renal ROS  negative genitourinary   Musculoskeletal negative musculoskeletal ROS (+)   Abdominal   Peds negative pediatric ROS (+)  Hematology negative hematology ROS (+)   Anesthesia Other Findings   Reproductive/Obstetrics negative OB ROS                             Lab Results  Component Value Date   WBC 16.0 (H) 05/27/2018   HGB 11.2 (L) 05/27/2018   HCT 35.8 (L) 05/27/2018   MCV 85.6 05/27/2018   PLT 237 05/27/2018    Anesthesia Physical Anesthesia Plan  ASA: II  Anesthesia Plan: MAC   Post-op Pain Management:    Induction: Intravenous  PONV Risk Score and Plan:   Airway Management Planned: Mask and Simple Face Mask  Additional Equipment:   Intra-op Plan:   Post-operative Plan:   Informed Consent: I have reviewed the patients History and Physical, chart, labs and discussed the procedure including the risks, benefits and alternatives for the proposed anesthesia with the patient or authorized representative who has indicated his/her understanding and acceptance.     Plan Discussed with: CRNA  Anesthesia Plan Comments:         Anesthesia Quick Evaluation

## 2018-05-27 NOTE — H&P (Signed)
The patient presents to the endoscopy unit for EGD to evaluate hematemesis  Physical  No distress  Heart regular rhythm  Lungs clear  Abdomen nontender  Impression hematemesis  Plan EGD

## 2018-05-27 NOTE — Progress Notes (Signed)
[facility].  Case manager to assist with arranging for this.  [name] HellingVineet Estella Malatesta, MD [facility] Pulmonary/Critical Care 05/27/2018, 12:32 PM

## 2018-05-27 NOTE — Progress Notes (Signed)
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  Required intubation for airway management.  Developed recurrent Upper GI bleed.  PAST MEDICAL HISTORY :  Asthma, Depression, HA  SUBJECTIVE:  Headache better.  Has discomfort in RUQ of abdomen.  Denies chest pain, dyspnea.  VITAL SIGNS: BP 117/79   Pulse 64   Temp 98.4 F (36.9 C) (Oral)   Resp 18   Ht 5\' 4"  (1.626 m)   Wt 153 lb 14.1 oz (69.8 kg)   SpO2 97%   BMI 26.41 kg/m   INTAKE / OUTPUT: I/O last 3 completed shifts: In: 2234.4 [P.O.:1080; I.V.:1104.4; IV Piggyback:50] Out: 4305 [Urine:4305]  PHYSICAL EXAMINATION:  General - pleasant Eyes - pupils reactive ENT - no sinus tenderness, no oral exudate, no LAN Cardiac - regular, no murmur Chest - no wheeze, rales Abd - soft, mild tenderness RUQ Ext - no edema Skin - no rashes Neuro - normal strength Psych - normal mood   LABS:  BMET Recent Labs  Lab 05/25/18 1242 05/26/18 0403 05/27/18 0348  NA 138 137 136  K 2.7* 3.7 3.8  CL 105 109 106  CO2 22 22 25   BUN 14 8 9   CREATININE 0.77 0.77 0.64  GLUCOSE 112* 144* 123*    Electrolytes Recent Labs  Lab 05/25/18 1242 05/26/18 0403 05/27/18 0348  CALCIUM 8.9 8.8* 8.4*  MG  --  2.1  --   PHOS  --  3.0  --     CBC Recent Labs  Lab 05/25/18 1242 05/26/18 0403 05/27/18 0348  WBC 6.7 6.9 16.0*  HGB 12.8 12.1 11.2*  HCT 40.9 38.1 35.8*  PLT 272 243 237    Coag's No results for input(s): APTT, INR in the last 168 hours.  Sepsis Markers No results for input(s): LATICACIDVEN, PROCALCITON, O2SATVEN in the last 168 hours.  ABG Recent Labs  Lab 05/25/18 1532 05/26/18 0345  PHART 7.321* 7.373  PCO2ART 44.5 39.1  PO2ART 487.0* 153.0*    Liver Enzymes No results for  input(s): AST, ALT, ALKPHOS, BILITOT, ALBUMIN in the last 168 hours.  Cardiac Enzymes No results for input(s): TROPONINI, PROBNP in the last 168 hours.  Glucose Recent Labs  Lab 05/25/18 1938  GLUCAP 113*    Imaging No results found.  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  Extubated w/o difficulty 7/01.  [facility], and doesn't have epipen at home.  Developed recurrent hematemesis 7/01.  ASSESSMENT / PLAN:  Acute hypoxic respiratory failure with compromised airway. 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 Hx of asthma. - wean off solumedrol  - prn benadryl, albuterol  Hematemesis with recurrent upper GI bleed for past 1 year. Functional nausea. - protonix gtt - GI planning EGD 7/02  Migraine headaches. - prn tramadol  DVT prophylaxis - SCDs SUP - protonix Nutrition - NPO Goals of care - full code  Will ask [facility] to assume care from 7/03 and PCCM off.  Coralyn HellingVineet Othmar Ringer, MD [facility] 05/27/2018, 8:53 AM

## 2018-05-27 NOTE — Progress Notes (Signed)
Obtained order for Epipen through Dr Craige CottaSood. [facility] it [facility] and delivered it to patient. It is in her pink and white striped bag. Discussed w bedside RN that she needs education to verify she knows how to use it prior to DC.

## 2018-05-27 NOTE — Anesthesia Postprocedure Evaluation (Signed)
Anesthesia Post Note  Patient: [name] [name]  Procedure(s) Performed: ESOPHAGOGASTRODUODENOSCOPY (EGD) WITH PROPOFOL WITH CLIP PLACEMENT (N/A ) BIOPSY     Patient location during evaluation: PACU Anesthesia Type: MAC Level of consciousness: awake and alert Pain management: pain level controlled Vital Signs Assessment: post-procedure vital signs reviewed and stable Respiratory status: spontaneous breathing, nonlabored ventilation, respiratory function stable and patient connected to nasal cannula oxygen Cardiovascular status: stable and blood pressure returned to baseline Postop Assessment: no apparent nausea or vomiting Anesthetic complications: no    Last Vitals:  Vitals:   05/27/18 1034 05/27/18 1040  BP: 111/70 119/84  Pulse: (!) 52 (!) 52  Resp: 19 17  Temp: 36.9 C   SpO2: 100% 99%    Last Pain:  Vitals:   05/27/18 1040  TempSrc:   PainSc: 0-No pain                 Barnet Glasgow

## 2018-05-27 NOTE — Op Note (Signed)
[facility] Patient Name: [name] [name] Procedure Date : 05/27/2018 MRN: [id number] Attending MD: Graylin Shiver , MD Date of Birth: October 12, 1991 CSN: [id number] Age: 27 Admit Type: Inpatient Procedure:                Upper GI endoscopy Indications:              Hematemesis Providers:                Graylin Shiver, MD, Dwain Sarna, RN, Kandice Robinsons, Technician Referring MD:              Medicines:                Propofol per Anesthesia Complications:            No immediate complications. Estimated Blood Loss:     Estimated blood loss: none. Procedure:                Pre-Anesthesia Assessment:                           - Prior to the procedure, a History and Physical                            was performed, and patient medications and                            allergies were reviewed. The patient's tolerance of                            previous anesthesia was also reviewed. The risks                            and benefits of the procedure and the sedation                            options and risks were discussed with the patient.                            All questions were answered, and informed consent                            was obtained. Prior Anticoagulants: The patient has                            taken no previous anticoagulant or antiplatelet                            agents. ASA Grade Assessment: II - A patient with                            mild systemic disease. After reviewing the risks  and benefits, the patient was deemed in                            satisfactory condition to undergo the procedure.                           After obtaining informed consent, the endoscope was                            passed under direct vision. Throughout the                            procedure, the patient's blood pressure, pulse, and                            oxygen saturations were monitored  continuously. The                            [id number]( [id number] ) was introduced through the                            mouth, and advanced to the second part of duodenum.                            The upper GI endoscopy was accomplished without                            difficulty. The patient tolerated the procedure                            well. Scope In: Scope Out: Findings:      The examined esophagus was normal.      One oozing cratered gastric ulcer was found on the greater curvature of       the stomach. The lesion was small, deep looking . For hemostasis, one       hemostatic clip was successfully placed. There was no bleeding at the       end of the procedure. Biopsies were taken with a cold forceps for       histology.      The examined duodenum was normal. Impression:               - Normal esophagus.                           - Oozing gastric ulcer. Clip was placed. Biopsied.                           - Normal examined duodenum. Recommendation:           - Clear liquid diet today.                           - Continue present medications.                           - Await pathology results.  Procedure Code(s):        --- Professional ---                           [phone], 59, Esophagogastroduodenoscopy, flexible,                            transoral; with control of bleeding, any method                           43239, Esophagogastroduodenoscopy, flexible,                            transoral; with biopsy, single or multiple Diagnosis Code(s):        --- Professional ---                           K25.4, Chronic or unspecified gastric ulcer with                            hemorrhage                           K92.0, Hematemesis CPT copyright 2017 American [employer] Association. All rights reserved. The codes documented in this report are preliminary and upon coder review may  be revised to meet current compliance requirements. Graylin Shiver, MD 05/27/2018 10:39:35 AM This  report has been signed electronically. Number of Addenda: 0

## 2018-05-28 ENCOUNTER — Other Ambulatory Visit: Payer: Self-pay

## 2018-05-28 DIAGNOSIS — J9621 Acute and chronic respiratory failure with hypoxia: Secondary | ICD-10-CM

## 2018-05-28 DIAGNOSIS — T7801XA Anaphylactic reaction due to peanuts, initial encounter: Principal | ICD-10-CM

## 2018-05-28 DIAGNOSIS — K920 Hematemesis: Secondary | ICD-10-CM

## 2018-05-28 DIAGNOSIS — K922 Gastrointestinal hemorrhage, unspecified: Secondary | ICD-10-CM

## 2018-05-28 LAB — CBC
HCT: 37.5 % (ref 36.0–46.0)
Hemoglobin: 11.8 g/dL — ABNORMAL LOW (ref 12.0–15.0)
MCH: 26.3 pg (ref 26.0–34.0)
MCHC: 31.5 g/dL (ref 30.0–36.0)
MCV: 83.7 fL (ref 78.0–100.0)
PLATELETS: 235 10*3/uL (ref 150–400)
RBC: 4.48 MIL/uL (ref 3.87–5.11)
RDW: 14.4 % (ref 11.5–15.5)
WBC: 10.2 10*3/uL (ref 4.0–10.5)

## 2018-05-28 LAB — BASIC METABOLIC PANEL
ANION GAP: 5 (ref 5–15)
BUN: 15 mg/dL (ref 6–20)
CALCIUM: 8.7 mg/dL — AB (ref 8.9–10.3)
CO2: 25 mmol/L (ref 22–32)
Chloride: 106 mmol/L (ref 98–111)
Creatinine, Ser: 0.81 mg/dL (ref 0.44–1.00)
Glucose, Bld: 106 mg/dL — ABNORMAL HIGH (ref 70–99)
Potassium: 3.5 mmol/L (ref 3.5–5.1)
SODIUM: 136 mmol/L (ref 135–145)

## 2018-05-28 MED ORDER — HEPARIN SODIUM (PORCINE) 5000 UNIT/ML IJ SOLN
5000.0000 [IU] | Freq: Three times a day (TID) | INTRAMUSCULAR | Status: DC
  Administered 2018-05-28: 5000 [IU] via SUBCUTANEOUS
  Filled 2018-05-28 (×2): qty 1

## 2018-05-28 MED ORDER — PANTOPRAZOLE SODIUM 40 MG IV SOLR
40.0000 mg | Freq: Two times a day (BID) | INTRAVENOUS | Status: DC
  Administered 2018-05-28 – 2018-05-29 (×2): 40 mg via INTRAVENOUS
  Filled 2018-05-28 (×3): qty 40

## 2018-05-28 NOTE — Progress Notes (Signed)
Received FMLA paperwork from patient which she stated was for doctor that did her gastric procedure to fill out. Placed paperwork on patient's chart and will pass on information to day shift nurse.

## 2018-05-28 NOTE — Progress Notes (Signed)
PROGRESS NOTE    [name] [name]  ZOX:[id number] DOB: 1991/02/07 DOA: 05/25/2018 PCP: Patient, No Pcp Per   Brief Narrative:  27 yo BF PMHx, Depression Asthma, Peanut allergy,   Anaphylaxis from exposure to peanut.  Required intubation for airway management.  Developed recurrent Upper GI bleed.     Subjective: 7/3 A/O x4 negative S OB, negative CP, positive residual abdominal pain from EGD procedure, negative nausea/vomiting.   Assessment & Plan:   Active Problems:   Anaphylaxis due to peanuts   Acute respiratory failure with hypoxia -Secondary to anaphylactic shock from peanuts. 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 -has scheduled f/u with [facility] on 7/29 at 3:10 and she will be able to use the [facility] for medication assistance, but they will be closed on 7/4, may need Match letter at dc -DC Solu-Medrol - NCM Debbie Swist filled order for EpiPen on 7/2.  Therefore patient has EpiPen's for discharge.  Hematemesis recurrent upper GI bleed x 1 year  Recent Labs  Lab 05/25/18 1242 05/26/18 0403 05/27/18 0348 05/28/18 0334  HGB 12.8 12.1 11.2* 11.8*  - S/P EGD 7/2 gastric ulcer clipped see results below.  Biopsy pending -Stable - Protonix 40 mg BID  Migraine headaches -Tramadol as needed     DVT prophylaxis: Subcu heparin Code Status: Full Family Communication: None Disposition Plan: Discharge on 7/4   Consultants:  PCCM GI   Procedures/Significant Events:  7/2 EGD;-one oozing cratered gastric ulcer was found on the greater curvature of the stomach. The lesion was small, deep looking . For hemostasis, one hemostatic clip was successfully placed.      I have personally reviewed and interpreted all radiology studies and my findings are as above.  VENTILATOR SETTINGS:    Cultures 7/2 EGD biopsy pending    Antimicrobials:    Devices    LINES / TUBES:      Continuous Infusions: . pantoprozole (PROTONIX)  infusion 8 mg/hr (05/28/18 0500)     Objective: Vitals:   05/28/18 0251 05/28/18 0300 05/28/18 0400 05/28/18 0500  BP:  108/64 110/77   Pulse:  (!) 57 63 79  Resp:  (!) 21 16 (!) 21  Temp: 98.3 F (36.8 C)     TempSrc: Oral     SpO2:  94% 96% 98%  Weight:    155 lb 6.8 oz (70.5 kg)  Height:        Intake/Output Summary (Last 24 hours) at 05/28/2018 0830 Last data filed at 05/28/2018 0500 Gross per 24 hour  Intake 1009.18 ml  Output -  Net 1009.18 ml   Filed Weights   05/26/18 0455 05/27/18 0500 05/28/18 0500  Weight: 137 lb 2 oz (62.2 kg) 153 lb 14.1 oz (69.8 kg) 155 lb 6.8 oz (70.5 kg)    Examination:  General: A/O x4, No acute respiratory distress Neck:  Negative scars, masses, torticollis, lymphadenopathy, JVD Lungs: Clear to auscultation bilaterally without wheezes or crackles Cardiovascular: Regular rate and rhythm without murmur gallop or rub normal S1 and S2 Abdomen: Positive abdominal pain with palpation, nondistended, positive soft, bowel sounds, no rebound, no ascites, no appreciable mass Extremities: No significant cyanosis, clubbing, or edema bilateral lower extremities Skin: Negative rashes, lesions, ulcers Psychiatric:  Negative depression, negative anxiety, negative fatigue, negative mania  Central nervous system:  Cranial nerves II through XII intact, tongue/uvula midline, all extremities muscle strength 5/5, sensation intact throughout, negative dysarthria, negative expressive aphasia, negative receptive aphasia.  .     Data  Reviewed: Care during the described time interval was provided by me .  I have reviewed this patient's available data, including medical history, events of note, physical examination, and all test results as part of my evaluation.   CBC: Recent Labs  Lab 05/25/18 1242 05/26/18 0403 05/27/18 0348 05/28/18 0334  WBC 6.7 6.9 16.0* 10.2  NEUTROABS 3.4  --   --   --   HGB 12.8 12.1 11.2* 11.8*  HCT 40.9 38.1 35.8* 37.5  MCV 84.9  83.7 85.6 83.7  PLT 272 243 237 235   Basic Metabolic Panel: Recent Labs  Lab 05/25/18 1242 05/26/18 0403 05/27/18 0348 05/28/18 0334  NA 138 137 136 136  K 2.7* 3.7 3.8 3.5  CL 105 109 106 106  CO2 22 22 25 25   GLUCOSE 112* 144* 123* 106*  BUN 14 8 9 15   CREATININE 0.77 0.77 0.64 0.81  CALCIUM 8.9 8.8* 8.4* 8.7*  MG  --  2.1  --   --   PHOS  --  3.0  --   --    GFR: Estimated Creatinine Clearance: 102.2 mL/min (by C-G formula based on SCr of 0.81 mg/dL). Liver Function Tests: No results for input(s): AST, ALT, ALKPHOS, BILITOT, PROT, ALBUMIN in the last 168 hours. No results for input(s): LIPASE, AMYLASE in the last 168 hours. No results for input(s): AMMONIA in the last 168 hours. Coagulation Profile: No results for input(s): INR, PROTIME in the last 168 hours. Cardiac Enzymes: No results for input(s): CKTOTAL, CKMB, CKMBINDEX, TROPONINI in the last 168 hours. BNP (last 3 results) No results for input(s): PROBNP in the last 8760 hours. HbA1C: No results for input(s): HGBA1C in the last 72 hours. CBG: Recent Labs  Lab 05/25/18 1938  GLUCAP 113*   Lipid Profile: No results for input(s): CHOL, HDL, LDLCALC, TRIG, CHOLHDL, LDLDIRECT in the last 72 hours. Thyroid Function Tests: No results for input(s): TSH, T4TOTAL, FREET4, T3FREE, THYROIDAB in the last 72 hours. Anemia Panel: No results for input(s): VITAMINB12, FOLATE, FERRITIN, TIBC, IRON, RETICCTPCT in the last 72 hours. Urine analysis:    Component Value Date/Time   COLORURINE YELLOW 02/18/2018 2156   APPEARANCEUR CLEAR 02/18/2018 2156   LABSPEC 1.024 02/18/2018 2156   PHURINE 6.0 02/18/2018 2156   GLUCOSEU NEGATIVE 02/18/2018 2156   HGBUR NEGATIVE 02/18/2018 2156   BILIRUBINUR NEGATIVE 02/18/2018 2156   KETONESUR 20 (A) 02/18/2018 2156   PROTEINUR NEGATIVE 02/18/2018 2156   UROBILINOGEN 0.2 09/16/2015 1553   NITRITE NEGATIVE 02/18/2018 2156   LEUKOCYTESUR TRACE (A) 02/18/2018 2156   Sepsis  Labs: @LABRCNTIP (procalcitonin:4,lacticidven:4)  ) Recent Results (from the past 240 hour(s))  MRSA PCR Screening     Status: None   Collection Time: 05/25/18  4:32 PM  Result Value Ref Range Status   MRSA by PCR NEGATIVE NEGATIVE Final    Comment:        The GeneXpert MRSA Assay (FDA approved for NASAL specimens only), is one component of a comprehensive MRSA colonization surveillance program. It is not intended to diagnose MRSA infection nor to guide or monitor treatment for MRSA infections. Performed at [facility], [address] [place], [place] [address]          Radiology Studies: No results found.      Scheduled Meds: . methylPREDNISolone (SOLU-MEDROL) injection  40 mg Intravenous Q12H  . [START ON 05/30/2018] pantoprazole  40 mg Intravenous Q12H   Continuous Infusions: . pantoprozole (PROTONIX) infusion 8 mg/hr (05/28/18 0500)  LOS: 3 days    Time spent: 40 minutes    Emilynn Srinivasan, Roselind MessierURTIS J, MD [facility] Pager [phone]   If 7PM-7AM, please contact night-coverage [email or web address] Password [email or web address] 05/28/2018, 8:30 AM

## 2018-05-28 NOTE — Progress Notes (Signed)
Pt arrived to unit via wheelchair and ambulated to bed. Identified appropriately and assessed. Alert and oriented, vital signs stable. Placed on telemetry monitor and continuous pulse ox. Will continue to monitor and treat according to orders.

## 2018-05-28 NOTE — Progress Notes (Signed)
[facility] Progress Note  Subjective: Doing better. No vomiting.   Objective: Vital signs in last 24 hours: Temp:  [98.3 F (36.8 C)-99.4 F (37.4 C)] 98.9 F (37.2 C) (07/03 0700) Pulse Rate:  [52-79] 79 (07/03 0500) Resp:  [13-26] 21 (07/03 0500) BP: (105-127)/(64-100) 110/77 (07/03 0400) SpO2:  [94 %-100 %] 98 % (07/03 0500) Weight:  [70.5 kg (155 lb 6.8 oz)] 70.5 kg (155 lb 6.8 oz) (07/03 0500) Weight change: 0.7 kg (1 lb 8.7 oz)   PE: No distress Abdomen soft  Lab Results: Results for orders placed or performed during the hospital encounter of 05/25/18 (from the past 24 hour(s))  CBC     Status: Abnormal   Collection Time: 05/28/18  3:34 AM  Result Value Ref Range   WBC 10.2 4.0 - 10.5 K/uL   RBC 4.48 3.87 - 5.11 MIL/uL   Hemoglobin 11.8 (L) 12.0 - 15.0 g/dL   HCT 40.937.5 81.136.0 - 91.446.0 %   MCV 83.7 78.0 - 100.0 fL   MCH 26.3 26.0 - 34.0 pg   MCHC 31.5 30.0 - 36.0 g/dL   RDW 78.214.4 95.611.5 - 21.315.5 %   Platelets 235 150 - 400 K/uL  Basic metabolic panel     Status: Abnormal   Collection Time: 05/28/18  3:34 AM  Result Value Ref Range   Sodium 136 135 - 145 mmol/L   Potassium 3.5 3.5 - 5.1 mmol/L   Chloride 106 98 - 111 mmol/L   CO2 25 22 - 32 mmol/L   Glucose, Bld 106 (H) 70 - 99 mg/dL   BUN 15 6 - 20 mg/dL   Creatinine, Ser 0.860.81 0.44 - 1.00 mg/dL   Calcium 8.7 (L) 8.9 - 10.3 mg/dL   GFR calc non Af Amer >60 >60 mL/min   GFR calc Af Amer >60 >60 mL/min   Anion gap 5 5 - 15    Studies/Results: No results found.    Assessment: Gastric ulcer. GI bleed.   Plan:   Advance diet.  Switch to oral Protonix.  Can transfer out of icu.  If she tolerates diet and no more vomiting she can go home.    Gwenevere AbbotSAM F GANEM 05/28/2018, 9:56 AM  Pager: [phone] If no answer or after 5 PM call [phone] Lab Results  Component Value Date   HGB 11.8 (L) 05/28/2018   HGB 11.2 (L) 05/27/2018   HGB 12.1 05/26/2018   HCT 37.5 05/28/2018   HCT 35.8 (L)  05/27/2018   HCT 38.1 05/26/2018   ALKPHOS 71 02/18/2018   ALKPHOS 65 01/08/2018   ALKPHOS 60 09/10/2017   AST 19 02/18/2018   AST 24 01/08/2018   AST 19 09/10/2017   ALT 16 02/18/2018   ALT 17 01/08/2018   ALT 18 09/10/2017

## 2018-05-29 DIAGNOSIS — Z91010 Allergy to peanuts: Secondary | ICD-10-CM

## 2018-05-29 DIAGNOSIS — T7801XD Anaphylactic reaction due to peanuts, subsequent encounter: Secondary | ICD-10-CM

## 2018-05-29 LAB — CBC
HEMATOCRIT: 40.2 % (ref 36.0–46.0)
Hemoglobin: 12.6 g/dL (ref 12.0–15.0)
MCH: 26.5 pg (ref 26.0–34.0)
MCHC: 31.3 g/dL (ref 30.0–36.0)
MCV: 84.5 fL (ref 78.0–100.0)
Platelets: 257 10*3/uL (ref 150–400)
RBC: 4.76 MIL/uL (ref 3.87–5.11)
RDW: 13.9 % (ref 11.5–15.5)
WBC: 10.6 10*3/uL — AB (ref 4.0–10.5)

## 2018-05-29 LAB — BASIC METABOLIC PANEL
ANION GAP: 8 (ref 5–15)
BUN: 16 mg/dL (ref 6–20)
CALCIUM: 9 mg/dL (ref 8.9–10.3)
CO2: 25 mmol/L (ref 22–32)
Chloride: 103 mmol/L (ref 98–111)
Creatinine, Ser: 0.75 mg/dL (ref 0.44–1.00)
GFR calc non Af Amer: >60 mL/min (ref >60)
GLUCOSE: 128 mg/dL — AB (ref 70–99)
POTASSIUM: 3.7 mmol/L (ref 3.5–5.1)
Sodium: 136 mmol/L (ref 135–145)

## 2018-05-29 MED ORDER — DIPHENHYDRAMINE HCL 50 MG/ML IJ SOLN
25.0000 mg | Freq: Once | INTRAMUSCULAR | Status: AC
  Administered 2018-05-29: 25 mg via INTRAVENOUS
  Filled 2018-05-29: qty 1

## 2018-05-29 MED ORDER — EPINEPHRINE 0.15 MG/0.15ML IJ SOAJ: 0.1500 mg | INTRAMUSCULAR | 1 refills | Status: DC | PRN

## 2018-05-29 MED ORDER — TRAMADOL HCL 50 MG PO TABS: 50.0000 mg | ORAL_TABLET | Freq: Four times a day (QID) | ORAL | 0 refills | Status: AC | PRN

## 2018-05-29 MED ORDER — PROMETHAZINE HCL 25 MG/ML IJ SOLN
12.5000 mg | Freq: Once | INTRAMUSCULAR | Status: AC
  Administered 2018-05-29: 12.5 mg via INTRAVENOUS
  Filled 2018-05-29 (×2): qty 1

## 2018-05-29 MED ORDER — SUMATRIPTAN SUCCINATE 50 MG PO TABS: 50.0000 mg | ORAL_TABLET | Freq: Once | ORAL | 0 refills | Status: DC | PRN

## 2018-05-29 MED ORDER — SUCRALFATE 1 G PO TABS: 1.0000 g | ORAL_TABLET | Freq: Three times a day (TID) | ORAL | 0 refills | Status: DC

## 2018-05-29 MED ORDER — SUCRALFATE 1 GM/10ML PO SUSP
1.0000 g | Freq: Three times a day (TID) | ORAL | Status: DC
  Administered 2018-05-29 (×2): 1 g via ORAL
  Filled 2018-05-29 (×2): qty 10

## 2018-05-29 MED ORDER — ONDANSETRON HCL 8 MG PO TABS: 8.0000 mg | ORAL_TABLET | Freq: Three times a day (TID) | ORAL | 0 refills | Status: DC | PRN

## 2018-05-29 MED ORDER — METOCLOPRAMIDE HCL 5 MG/ML IJ SOLN
10.0000 mg | Freq: Once | INTRAMUSCULAR | Status: AC
  Administered 2018-05-29: 10 mg via INTRAVENOUS
  Filled 2018-05-29: qty 2

## 2018-05-29 MED ORDER — SUMATRIPTAN SUCCINATE 100 MG PO TABS
100.0000 mg | ORAL_TABLET | Freq: Once | ORAL | Status: AC
  Administered 2018-05-29: 100 mg via ORAL
  Filled 2018-05-29: qty 1

## 2018-05-29 MED ORDER — PANTOPRAZOLE SODIUM 40 MG PO TBEC: 40.0000 mg | DELAYED_RELEASE_TABLET | Freq: Two times a day (BID) | ORAL | 3 refills | Status: DC

## 2018-05-29 NOTE — Discharge Summary (Signed)
Physician Discharge Summary   Patient ID: [name] [name] MRN: [id number] DOB/AGE: February 05, 1991 26 y.o.  Admit date: 05/25/2018 Discharge date: 05/29/2018  Primary Care Physician:  Patient, No Pcp Per   Recommendations for Outpatient Follow-up:  1. Follow up with PCP scheduled  2. Patient given prescription for epinephrine 3. Recommended referral to allergy and immunology  Home Health: None  Equipment/Devices: none   Discharge Condition: stable  CODE STATUS: FULL  Diet recommendation: Soft diet   Discharge Diagnoses:    . 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 Iohexol Hives, Shortness Of Breath, Itching, Nausea And Vomiting, Swelling and Other (See Comments)    Also caused chest pain PER DR BARRY, PRE MED PROTOCOL SHOULD BE ADMINISTERED PRIOR TO SCANS IN THE FUTURE.  PT HAD A SIGNIFICANT REACTION.  [facility] Patient had a reaction of SOB, swelling even though premedicated. Physician states no further contrast imaging in the future.  . Peanut-Containing Drug Products Anaphylaxis and Swelling    Facial swelling  . Other Swelling    Tree nuts cause facial swelling  . Sulfa Antibiotics Swelling    Facial swelling ([facility]     DISCHARGE MEDICATIONS: Allergies as of 05/29/2018      Reactions   Iohexol Hives, Shortness Of Breath, Itching, Nausea And Vomiting, Swelling, Other (See Comments)   Also caused chest pain PER DR BARRY, PRE MED PROTOCOL SHOULD BE ADMINISTERED PRIOR TO SCANS IN THE FUTURE.  PT HAD A SIGNIFICANT REACTION.  [facility] Patient had a reaction of SOB, swelling even though premedicated. Physician states no further contrast imaging in the future.   Peanut-containing Drug Products  Anaphylaxis, Swelling   Facial swelling   Other Swelling   Tree nuts cause facial swelling   Sulfa Antibiotics Swelling   [facility]      Medication List    TAKE these medications   DEPO-PROVERA IM Inject 1 each into the muscle every 3 (three) months. Last injection May 2019   EPINEPHrine 0.15 MG/0.15ML injection Commonly known as:  EPIPEN JR Inject 0.15 mLs (0.15 mg total) into the muscle as needed for anaphylaxis.   EXCEDRIN PO Take 1 tablet by mouth every 6 (six) hours as needed (migraine).   ondansetron 8 MG tablet Commonly known as:  ZOFRAN Take 1 tablet (8 mg total) by mouth every 8 (eight) hours as needed for nausea or vomiting.   pantoprazole 40 MG tablet Commonly known as:  PROTONIX Take 1 tablet (40 mg total) by mouth 2 (two) times daily before a meal. What changed:  when to take this   PROAIR HFA 108 (90 Base) MCG/ACT inhaler Generic drug:  albuterol Inhale 2 puffs into the lungs every 6 (six) hours as needed for wheezing or shortness of breath.   sucralfate 1 g tablet Commonly known as:  CARAFATE Take 1 tablet (1 g total) by mouth 4 (four) times daily -  with meals and at bedtime.   SUMAtriptan 50 MG tablet Commonly known as:  IMITREX Take 1 tablet (50 mg total) by mouth once as needed for migraine. May repeat in 2 hours if headache persists or recurs.   traMADol 50 MG tablet Commonly known as:  ULTRAM Take 1 tablet (  50 mg total) by mouth every 6 (six) hours as needed for up to 5 days for moderate pain.        Brief H and P: For complete details please refer to admission H and P, but in brief patient is a 27 year old with a history of severe peanut allergy who came into contact with someone eating peanut butter on the day of admission.  She quickly developed shortness of breath, swelling of the lips, tongue and throat.  Patient was treated with famotidine and epinephrine by EMS.  She required intubation and mechanical  ventilation in ED.  Patient was admitted to ICU for further management.  Hospital Course:   Acute hypoxic respiratory failure secondary to anaphylaxis -Patient was intubated in the ED, for airway management, due to severe anaphylaxis. -Extubated on 7/1, she was treated with Solu-Medrol, Pepcid and Benadryl. 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  She was given prescription for epinephrine and recommended follow-up with allergy immunology. Case management assisted with arranging epinephrine.   Upper GI bleed secondary to gastric ulcer -During hospitalization patient was noted to have hematemesis with bright red blood.  Patient also reported that she had been doing that for months.  She was placed on IV PPI. -GI was consulted, patient underwent endoscopy which showed normal esophagus, oozing gastric ulcer, clip was placed, biopsied.  Biopsy was negative for H. Pylori. -Patient was placed on Protonix twice daily and Carafate.   - Follow outpatient with GI in 2 weeks  Migraine headaches -Patient strongly recommended to avoid NSAIDs given gastric ulcer and an upper GI bleeding. -She was given prescription for sumatriptan, tramadol prn.   Day of Discharge S: Complaining, typical for migraines, mild epigastric pain, tolerating diet.  BP 116/83 (BP Location: Right Arm)   Pulse (!) 57   Temp 98.4 F (36.9 C) (Oral)   Resp 16   Ht 5\' 4"  (1.626 m)   Wt 70.5 kg (155 lb 6.8 oz)   SpO2 100%   BMI 26.68 kg/m   Physical Exam: General: Alert and awake oriented x3 not in any acute distress. HEENT: anicteric sclera, pupils reactive to light and accommodation CVS: S1-S2 clear no murmur rubs or gallops Chest: clear to auscultation bilaterally, no wheezing rales or rhonchi Abdomen: soft nontender, nondistended, normal bowel sounds Extremities: no cyanosis, clubbing or edema noted bilaterally Neuro: Cranial nerves  II-XII intact, no focal neurological deficits   The results of significant diagnostics from this hospitalization (including imaging, microbiology, ancillary and laboratory) are listed below for reference.      Procedures/Studies:  Dg Chest Port 1 View  Result Date: 05/25/2018 CLINICAL DATA:  Patient status post intubation EXAM: PORTABLE CHEST 1 VIEW COMPARISON:  Chest radiograph 02/18/2018 FINDINGS: ET tube terminates in the mid trachea. Monitoring leads overlie the patient. Stable cardiac and mediastinal contours. Bibasilar atelectasis. No pleural effusion or pneumothorax. IMPRESSION: ET tube terminates in the mid trachea. Electronically Signed   By: Annia Belt M.D.   On: 05/25/2018 15:23   Dg Abd Portable 1v  Result Date: 05/25/2018 CLINICAL DATA:  27 year old female with enteric tube placement. EXAM: PORTABLE ABDOMEN - 1 VIEW COMPARISON:  CT of the abdomen pelvis dated 02/18/2018 FINDINGS: Partially visualized enteric tube with side port in the body of the stomach and tip in the distal stomach. No dilatation of the small bowel. Air is noted throughout the colon. The osseous structures and soft  tissues appear unremarkable. IMPRESSION: Enteric tube with tip in the distal stomach. Electronically Signed   By: Elgie Collard M.D.   On: 05/25/2018 21:59       LAB RESULTS: Basic Metabolic Panel: Recent Labs  Lab 05/26/18 0403  05/28/18 0334 05/29/18 0357  NA 137   < > 136 136  K 3.7   < > 3.5 3.7  CL 109   < > 106 103  CO2 22   < > 25 25  GLUCOSE 144*   < > 106* 128*  BUN 8   < > 15 16  CREATININE 0.77   < > 0.81 0.75  CALCIUM 8.8*   < > 8.7* 9.0  MG 2.1  --   --   --   PHOS 3.0  --   --   --    < > = values in this interval not displayed.   Liver Function Tests: No results for input(s): AST, ALT, ALKPHOS, BILITOT, PROT, ALBUMIN in the last 168 hours. No results for input(s): LIPASE, AMYLASE in the last 168 hours. No results for input(s): AMMONIA in the last 168  hours. CBC: Recent Labs  Lab 05/25/18 1242  05/28/18 0334 05/29/18 0357  WBC 6.7   < > 10.2 10.6*  NEUTROABS 3.4  --   --   --   HGB 12.8   < > 11.8* 12.6  HCT 40.9   < > 37.5 40.2  MCV 84.9   < > 83.7 84.5  PLT 272   < > 235 257   < > = values in this interval not displayed.   Cardiac Enzymes: No results for input(s): CKTOTAL, CKMB, CKMBINDEX, TROPONINI in the last 168 hours. BNP: Invalid input(s): POCBNP CBG: Recent Labs  Lab 05/25/18 1938  GLUCAP 113*      Disposition and Follow-up: Discharge Instructions    Diet - low sodium heart healthy   Complete by:  As directed    Increase activity slowly   Complete by:  As directed        DISPOSITION: home    DISCHARGE FOLLOW-UP Follow-up Information    [facility] Follow up on 06/23/2018.   Why:  3:10 for hospital follow up Contact information: [address] [place] [address] [phone]       [facility] Follow up.   Why:  you can use the pharmacy for medication assistance here. Contact information: [address] [place] [address] [place] [address] [phone]       Graylin Shiver, MD. Schedule an appointment as soon as possible for a visit in 2 week(s).   Specialty:  Gastroenterology Contact information: [address] [place] [address] [phone]        Sidney Ace, MD. Schedule an appointment as soon as possible for a visit in 2 week(s).   Specialty:  Allergy Contact information: [address] [place] [address] [phone]            Time coordinating discharge:    Signed:   Thad Ranger M.D. [facility] 05/29/2018, 2:51 PM Pager: [phone]

## 2018-05-29 NOTE — Progress Notes (Signed)
Remigio EisenmengerAmber Rayson to be D/C'd Home per MD order.  Discussed with the patient and all questions fully answered.  VSS, Skin clean, dry and intact without evidence of skin break down, no evidence of skin tears noted. IV catheter discontinued intact. Site without signs and symptoms of complications. Dressing and pressure applied.  An After Visit Summary was printed and given to the patient. Patient received prescription.  D/c education completed with patient/family including follow up instructions, medication list, d/c activities limitations if indicated, with other d/c instructions as indicated by MD - patient able to verbalize understanding, all questions fully answered.   Patient instructed to return to ED, call 911, or call MD for any changes in condition.   Patient escorted via WC, and D/C home via private auto.  Caren HazyKamila A Daziah Hesler 05/29/2018 3:06 PM

## 2018-05-29 NOTE — Progress Notes (Signed)
Patient c/o nausea, vomiting, and lightheadedness. Vomit and sputum contains bright red blood. Paged Craige CottaKirby, NP and administered zofran prn. Will continue to monitor pt and await new orders.

## 2018-05-29 NOTE — Progress Notes (Signed)
Pt c/o nausea, vomiting, and a headache. Refused to eat her breakfast tray. Stated that zofran does not help her nausea. Spoke with Dr. Isidoro Donningai. Changed diet to a soft diet, administered Sumatriptan 100mg  at 0910, IV diphenhydramine 25mg  and IV reglan 10mg  @ 1030. Pt stated she will try to order a lunch tray and eat. Will continue to monitor pt.

## 2018-06-23 ENCOUNTER — Encounter (INDEPENDENT_AMBULATORY_CARE_PROVIDER_SITE_OTHER): Payer: Self-pay | Admitting: Physician Assistant

## 2018-06-23 ENCOUNTER — Other Ambulatory Visit: Payer: Self-pay

## 2018-06-23 ENCOUNTER — Ambulatory Visit (INDEPENDENT_AMBULATORY_CARE_PROVIDER_SITE_OTHER): Payer: Medicaid Other | Admitting: Physician Assistant

## 2018-06-23 VITALS — BP 122/83 | HR 73 | Temp 97.9°F | Ht 64.0 in | Wt 148.4 lb

## 2018-06-23 DIAGNOSIS — K254 Chronic or unspecified gastric ulcer with hemorrhage: Secondary | ICD-10-CM | POA: Diagnosis not present

## 2018-06-23 DIAGNOSIS — G43909 Migraine, unspecified, not intractable, without status migrainosus: Secondary | ICD-10-CM

## 2018-06-23 DIAGNOSIS — Z09 Encounter for follow-up examination after completed treatment for conditions other than malignant neoplasm: Secondary | ICD-10-CM

## 2018-06-23 DIAGNOSIS — T782XXA Anaphylactic shock, unspecified, initial encounter: Secondary | ICD-10-CM

## 2018-06-23 MED ORDER — SUCRALFATE 1 GM/10ML PO SUSP: 1.0000 g | Freq: Three times a day (TID) | ORAL | 0 refills | Status: DC

## 2018-06-23 MED ORDER — EPINEPHRINE 0.3 MG/0.3ML IJ SOAJ: 0.3000 mg | Freq: Once | INTRAMUSCULAR | 1 refills | Status: AC

## 2018-06-23 MED ORDER — TOPIRAMATE 25 MG PO TABS: 25.0000 mg | ORAL_TABLET | Freq: Every day | ORAL | 2 refills | Status: DC

## 2018-06-23 NOTE — Patient Instructions (Addendum)
I advise you go to the ED and have another GI consult with EGD due to you vomiting blood.    Peptic Ulcer A peptic ulcer is a sore in the lining of the esophagus (esophageal ulcer), the stomach (gastric ulcer), or the first part of the small intestine (duodenal ulcer). The ulcer causes gradual wearing away (erosion) into the deeper tissue. What are the causes? Normally, the lining of the stomach and the small intestine protects itself from the acid that digests food. The protective lining can be damaged by:  An infection caused by a germ (bacterium) called Helicobacter pylori or H. pylori.  Regular use of NSAIDs, such as ibuprofen or aspirin.  Rare tumors in the stomach, small intestine, or pancreas (Zollinger-Ellison syndrome).  What increases the risk? The following factors may make you more likely to develop this condition:  Smoking.  Having a family history of ulcer disease.  What are the signs or symptoms? Symptoms of this condition include:  Burning pain or gnawing in the area between the chest and the belly button. The pain may be worse on an empty stomach and at night.  Heartburn.  Nausea and vomiting.  Bloating.  If the ulcer results in bleeding, it can cause:  Black, tarry stools.  Vomiting of bright red blood.  Vomiting of material that looks like coffee grounds.  How is this diagnosed? This condition may be diagnosed based on:  Medical history and physical exam.  Various tests or procedures, such as: ? Blood tests, stool tests, or breath tests to check for the H. pylori bacterium. ? An X-ray exam (upper gastrointestinal series) of the esophagus, stomach, and small intestine. ? Upper endoscopy. The health care provider examines the esophagus, stomach, and small intestine using a small flexible tube that has a video camera at the end. ? Biopsy. A tissue sample is removed to be examined under a microscope.  How is this treated? Treatment for this condition  may include:  Eliminating the cause of the ulcer, such as smoking or the use of NSAIDs or alcohol.  Medicines to reduce the amount of acid in your digestive tract.  Antibiotic medicines, if the ulcer is caused by the H. pylori bacterium.  An upper endoscopy to treat a bleeding ulcer.  Surgery, if the bleeding is severe or if the ulcer created a hole somewhere in the digestive system.  Follow these instructions at home:  Avoid alcohol and caffeine.  Do not use any tobacco products, such as cigarettes, chewing tobacco, and e-cigarettes. If you need help quitting, ask your health care provider.  Take over-the-counter and prescription medicines only as told by your health care provider. Do not use over-the-counter medicines in place of prescription medicines unless your health care provider approves.  Keep all follow-up visits as told by your health care provider. This is important. Contact a health care provider if:  Your symptoms do not improve within 7 days of starting treatment.  You have ongoing indigestion or heartburn. Get help right away if:  You have sudden, sharp, or persistent pain in your abdomen.  You have bloody or dark black, tarry stools.  You vomit blood or material that looks like coffee grounds.  You become light-headed or you feel faint.  You become weak.  You become sweaty or clammy. This information is not intended to replace advice given to you by your health care provider. Make sure you discuss any questions you have with your health care provider. Document Released: 11/09/2000 Document Revised:  04/16/2016 Document Reviewed: 08/13/2015 Elsevier Interactive Patient Education  [employer]2018 Elsevier Inc.

## 2018-06-23 NOTE — Progress Notes (Signed)
Subjective:  Patient ID: [name] [name], female    DOB: February 15, 1991  Age: 27 y.o. MRN: [id number]  CC: hospital f/u    HPI [name] [name] is a 27 y.o. female with a medical history of depression, migraines, and asthma presents as a new patient on hospital f/u. Admitted on 05/25/18 for anaphylaxis, acute hypoxic respiratory failure, and gastric ulcer. Discharged on 05/29/18. Had hemostatic clip placed on one oozing ulcer. Says she has been vomiting blood since hospital discharge and shows a picture of moderate amount of blood in the toilet. She feels fatigued, has epigastric pain, and is "cold all the time".     Also requests medication for migraine headache. Has four migraines per week which usually last approximately two days each. Location of headache is bitemporal and occipital. Associated with photophobia, nausea, and visual blurring. Relieved when sleeping in a dark room. Has never been prescribed any medications for migraine headaches.     Outpatient Medications Prior to Visit  Medication Sig Dispense Refill  . albuterol (PROAIR HFA) 108 (90 Base) MCG/ACT inhaler Inhale 2 puffs into the lungs every 6 (six) hours as needed for wheezing or shortness of breath.    . medroxyPROGESTERone Acetate (DEPO-PROVERA IM) Inject 1 each into the muscle every 3 (three) months. Last injection May 2019    . ondansetron (ZOFRAN) 8 MG tablet Take 1 tablet (8 mg total) by mouth every 8 (eight) hours as needed for nausea or vomiting. 20 tablet 0  . pantoprazole (PROTONIX) 40 MG tablet Take 1 tablet (40 mg total) by mouth 2 (two) times daily before a meal. 60 tablet 3  . sucralfate (CARAFATE) 1 g tablet Take 1 tablet (1 g total) by mouth 4 (four) times daily -  with meals and at bedtime. 120 tablet 0  . SUMAtriptan (IMITREX) 50 MG tablet Take 1 tablet (50 mg total) by mouth once as needed for migraine. May repeat in 2 hours if headache persists or recurs. 30 tablet 0  . EPINEPHrine 0.15 MG/0.15ML IJ injection  Inject 0.15 mLs (0.15 mg total) into the muscle as needed for anaphylaxis. (Patient not taking: Reported on 06/23/2018) 2 Device 1  . Aspirin-Acetaminophen-Caffeine (EXCEDRIN PO) Take 1 tablet by mouth every 6 (six) hours as needed (migraine).     No facility-administered medications prior to visit.      ROS Review of Systems  Constitutional: Negative for chills, fever and malaise/fatigue.  Eyes: Negative for blurred vision.  Respiratory: Negative for shortness of breath.   Cardiovascular: Negative for chest pain and palpitations.  Gastrointestinal: Positive for abdominal pain, nausea and vomiting.  Genitourinary: Negative for dysuria and hematuria.  Musculoskeletal: Negative for joint pain and myalgias.  Skin: Negative for rash.  Neurological: Positive for headaches. Negative for tingling.  Psychiatric/Behavioral: Negative for depression. The patient is not nervous/anxious.     Objective:  BP 122/83 (BP Location: Left Arm, Patient Position: Sitting, Cuff Size: Normal)   Pulse 73   Temp 97.9 F (36.6 C) (Oral)   Ht 5\' 4"  (1.626 m)   Wt 148 lb 6.4 oz (67.3 kg)   SpO2 100%   BMI 25.47 kg/m   BP/Weight 06/23/2018 05/29/2018 05/28/2018  Systolic BP 122 103 -  Diastolic BP 83 66 -  Wt. (Lbs) 148.4 - 155.42  BMI 25.47 - 26.68      Physical Exam  Constitutional: She is oriented to person, place, and time.  Well developed, well nourished, NAD, polite, appears cold and teeth are chattering.  HENT:  Head: Normocephalic and atraumatic.  Eyes: No scleral icterus.  Neck: Normal range of motion. Neck supple. No thyromegaly present.  Cardiovascular: Normal rate, regular rhythm and normal heart sounds.  Pulmonary/Chest: Effort normal and breath sounds normal.  Abdominal: Soft. Bowel sounds are normal. There is tenderness (epigastric).  Musculoskeletal: She exhibits no edema.  Neurological: She is alert and oriented to person, place, and time.  Skin: Skin is warm and dry. No rash noted.  No erythema. No pallor.  Psychiatric: She has a normal mood and affect. Her behavior is normal. Thought content normal.  Vitals reviewed.    Assessment & Plan:   1. Hospital discharge follow-up - Notes and labs reviewed.  2. Chronic gastric ulcer with hemorrhage - Pt advised to return to the ED for EGD as she is still having episodes of hematemesis.  - Changed to liquid form. sucralfate (CARAFATE) 1 GM/10ML suspension; Take 10 mLs (1 g total) by mouth 4 (four) times daily -  with meals and at bedtime.  Dispense: 420 mL; Refill: 0  3. Anaphylaxis, initial encounter - Begin PRN EPINEPHrine (EPIPEN 2-PAK) 0.3 mg/0.3 mL IJ SOAJ injection; Inject 0.3 mLs (0.3 mg total) into the muscle once for 1 dose.  Dispense: 2 Device; Refill: 1  4. Migraine without status migrainosus, not intractable, unspecified migraine type - Begin topiramate (TOPAMAX) 25 MG tablet; Take 1 tablet (25 mg total) by mouth at bedtime.  Dispense: 30 tablet; Refill: 2   Meds ordered this encounter  Medications  . EPINEPHrine (EPIPEN 2-PAK) 0.3 mg/0.3 mL IJ SOAJ injection    Sig: Inject 0.3 mLs (0.3 mg total) into the muscle once for 1 dose.    Dispense:  2 Device    Refill:  1    Order Specific Question:   Supervising Provider    Answer:   Hoy Register [[id number]]  . sucralfate (CARAFATE) 1 GM/10ML suspension    Sig: Take 10 mLs (1 g total) by mouth 4 (four) times daily -  with meals and at bedtime.    Dispense:  420 mL    Refill:  0    Order Specific Question:   Supervising Provider    Answer:   Hoy Register [[id number]]  . topiramate (TOPAMAX) 25 MG tablet    Sig: Take 1 tablet (25 mg total) by mouth at bedtime.    Dispense:  30 tablet    Refill:  2    Order Specific Question:   Supervising Provider    Answer:   Hoy Register [[id number]]    Follow-up: Return in about 1 month (around 07/21/2018), or if symptoms worsen or fail to improve, for TDAP, PAP, and migraines.   Loletta Specter PA

## 2018-06-26 IMAGING — US US ABDOMEN LIMITED
1 series · 14 of 25 positions shown · non-contrast
Comparison: None.

CLINICAL DATA: Right upper quadrant pain

EXAM:
ULTRASOUND ABDOMEN LIMITED RIGHT UPPER QUADRANT

[Series 1: us abdomen limited · 0.20mm/px · 14 of 81 slices shown]
[im 1/81]
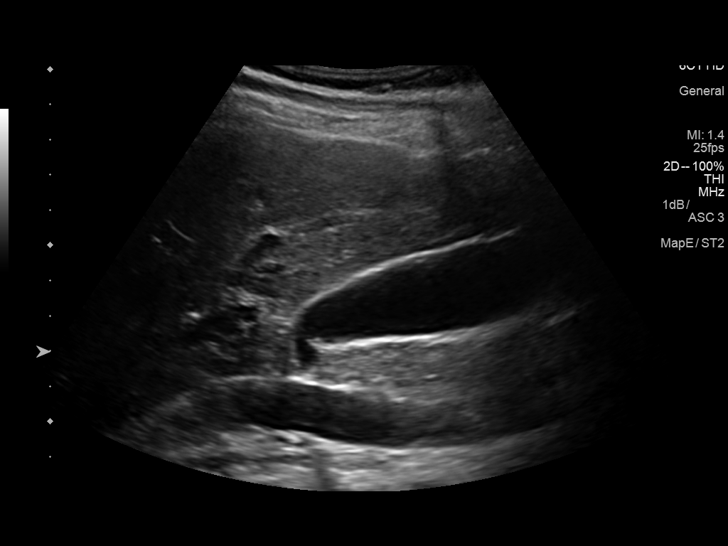
[im 7/81]
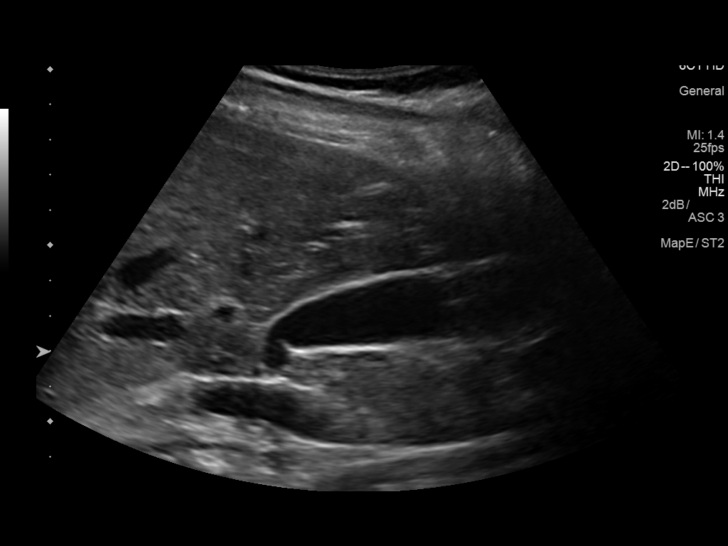
[im 14/81]
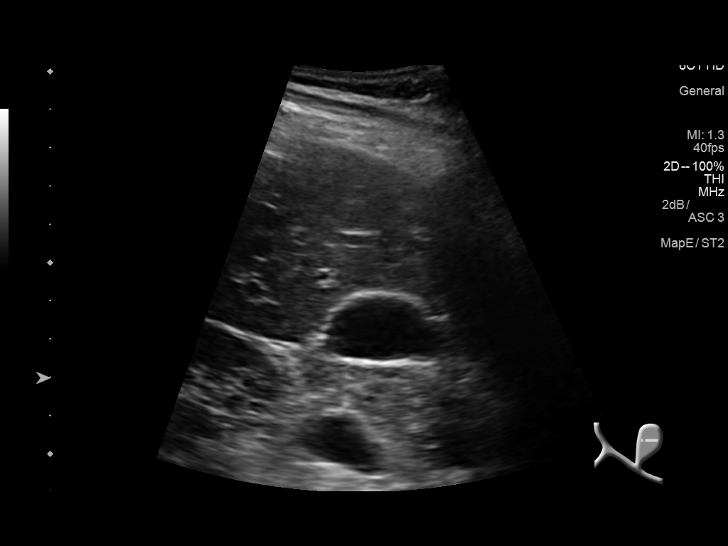
[im 21/81]
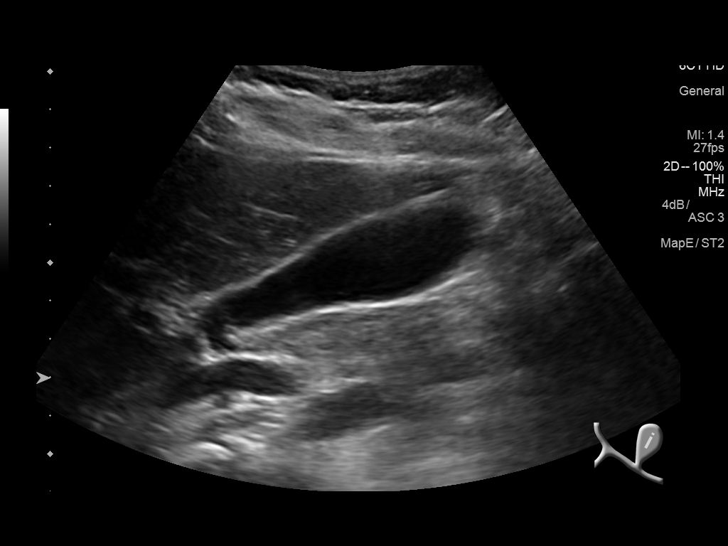
[im 27/81]
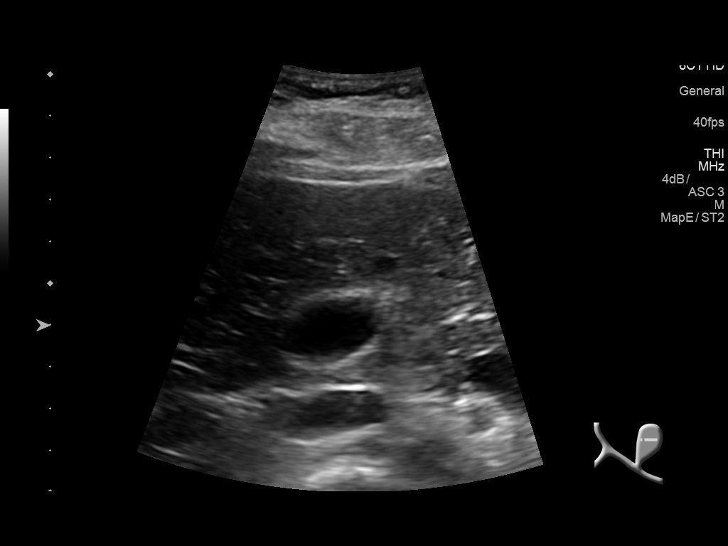
[im 31/81]
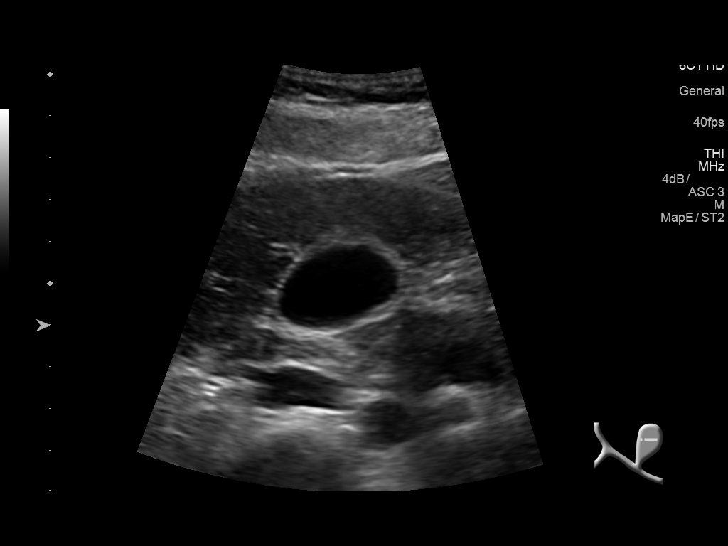
[im 37/81]
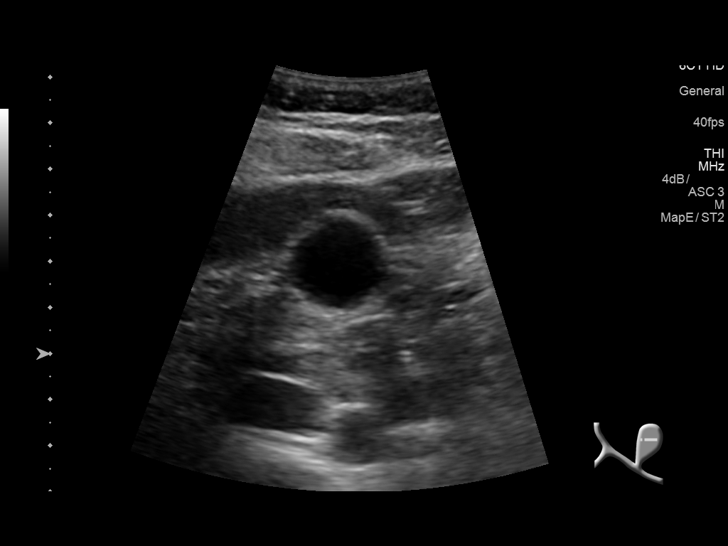
[im 44/81]
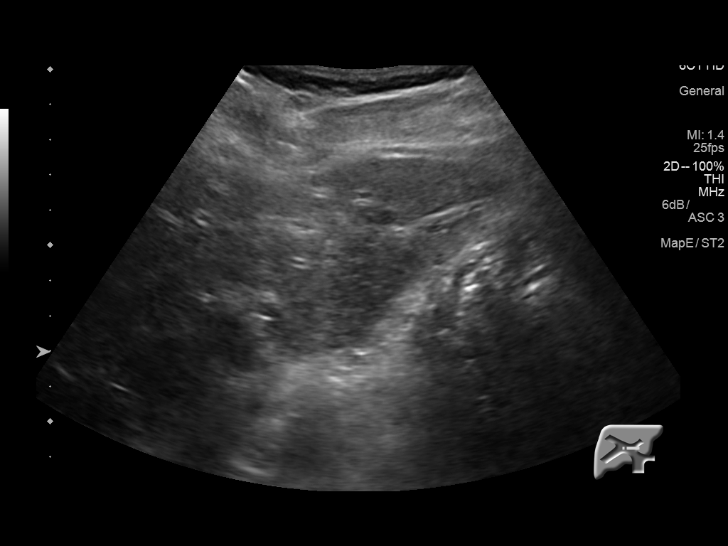
[im 51/81]
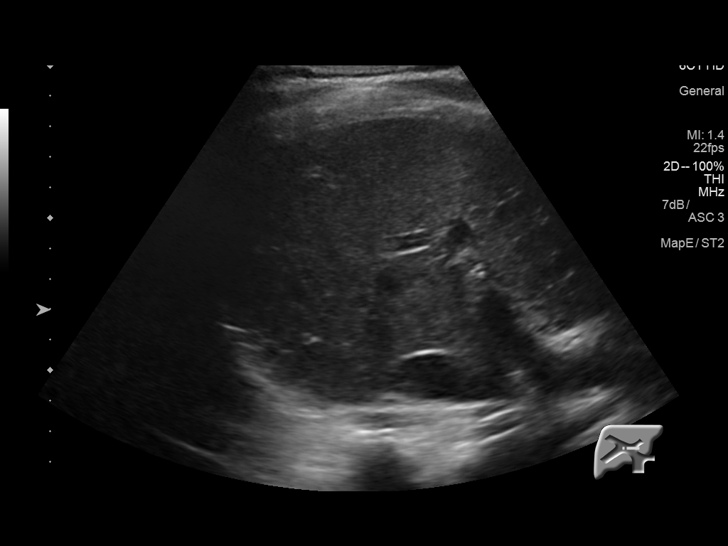
[im 54/81]
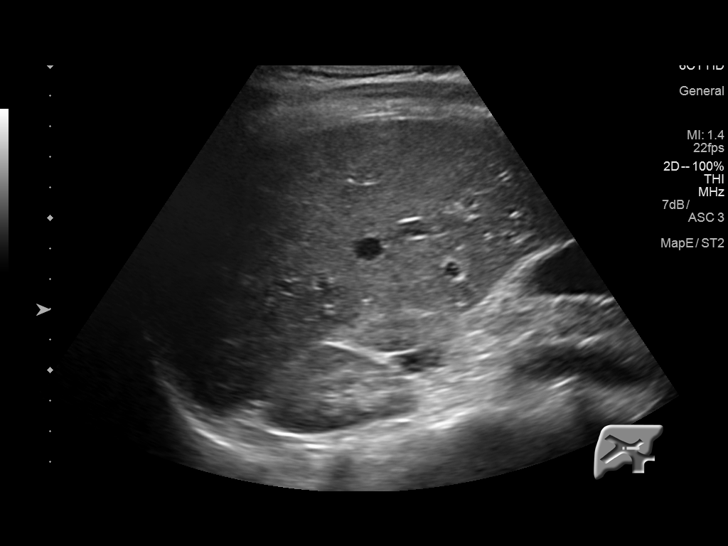
[im 61/81]
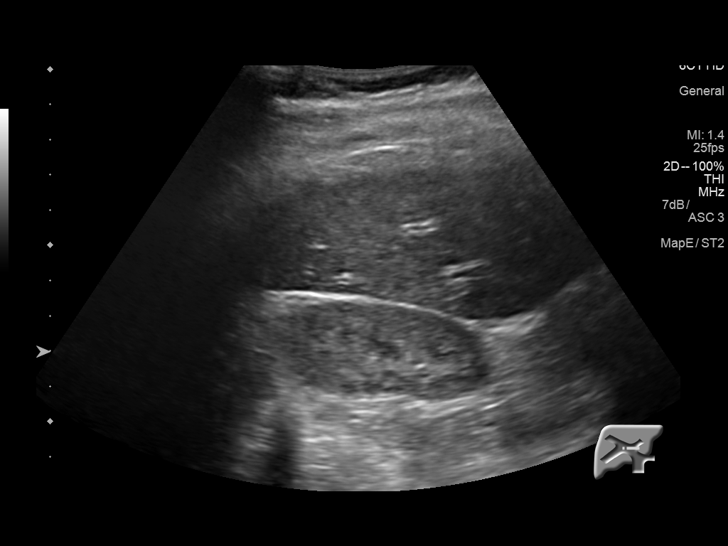
[im 67/81]
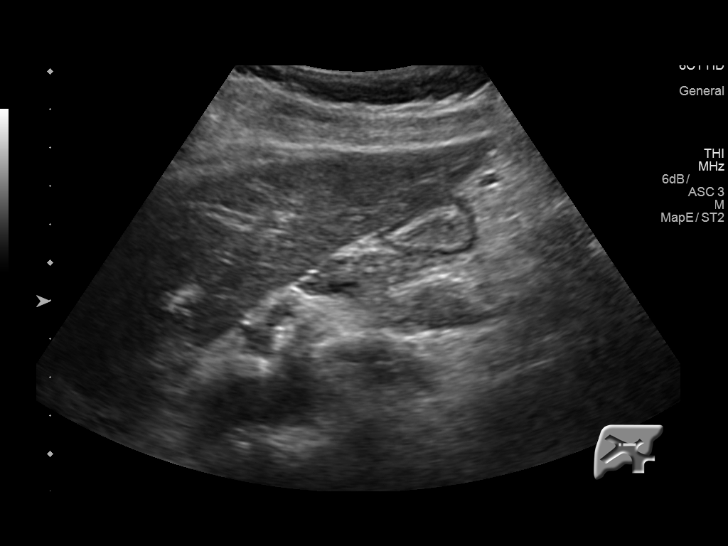
[im 74/81]
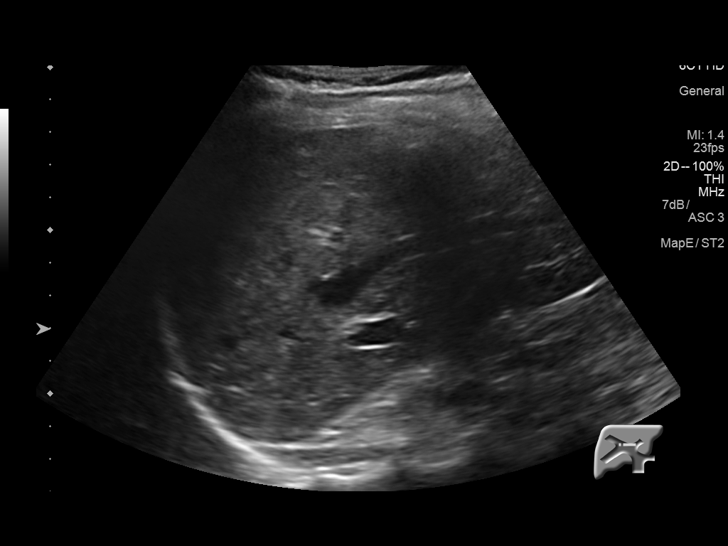
[im 81/81]
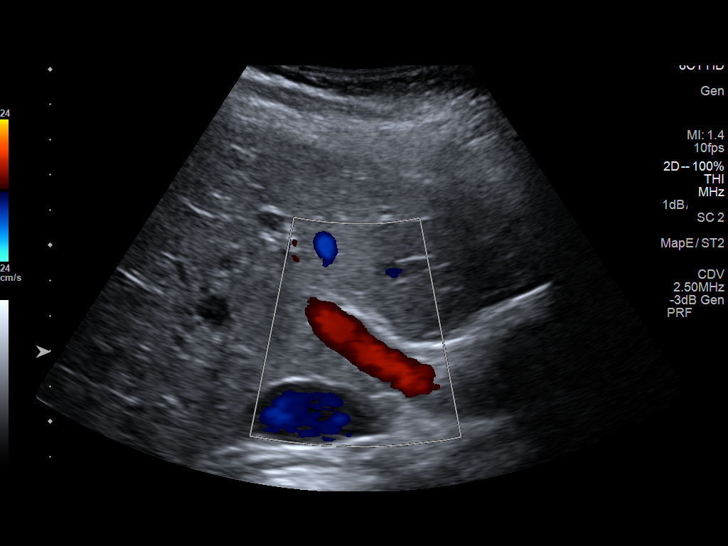

[14 of 25 positions shown; findings below may reference images not displayed]

FINDINGS: Gallbladder:

No gallstones or wall thickening visualized. No sonographic Murphy
sign noted by sonographer.

Common bile duct:

Diameter: 1.8 mm

Liver:

No focal lesion identified. Within normal limits in parenchymal
echogenicity. Portal vein is patent on color Doppler imaging with
normal direction of blood flow towards the liver.
IMPRESSION: Normal right upper quadrant ultrasound.

## 2018-11-01 ENCOUNTER — Other Ambulatory Visit: Payer: Self-pay

## 2018-11-01 ENCOUNTER — Emergency Department (HOSPITAL_COMMUNITY)
Admission: EM | Admit: 2018-11-01 | Discharge: 2018-11-01 | Disposition: A | Discharge status: Home / Self Care | Payer: Medicaid Other | Attending: Emergency Medicine | Admitting: Emergency Medicine

## 2018-11-01 ENCOUNTER — Encounter (HOSPITAL_COMMUNITY): Payer: Self-pay

## 2018-11-01 DIAGNOSIS — J45909 Unspecified asthma, uncomplicated: Secondary | ICD-10-CM | POA: Insufficient documentation

## 2018-11-01 DIAGNOSIS — Z87891 Personal history of nicotine dependence: Secondary | ICD-10-CM | POA: Diagnosis not present

## 2018-11-01 DIAGNOSIS — Z91010 Allergy to peanuts: Secondary | ICD-10-CM | POA: Diagnosis not present

## 2018-11-01 DIAGNOSIS — Z79899 Other long term (current) drug therapy: Secondary | ICD-10-CM | POA: Insufficient documentation

## 2018-11-01 DIAGNOSIS — R21 Rash and other nonspecific skin eruption: Secondary | ICD-10-CM | POA: Diagnosis not present

## 2018-11-01 LAB — COMPREHENSIVE METABOLIC PANEL
ALBUMIN: 3.7 g/dL (ref 3.5–5.0)
ALT: 14 U/L (ref 0–44)
ANION GAP: 10 (ref 5–15)
AST: 14 U/L — ABNORMAL LOW (ref 15–41)
Alkaline Phosphatase: 67 U/L (ref 38–126)
BILIRUBIN TOTAL: 0.3 mg/dL (ref 0.3–1.2)
BUN: 19 mg/dL (ref 6–20)
CO2: 23 mmol/L (ref 22–32)
Calcium: 9.4 mg/dL (ref 8.9–10.3)
Chloride: 106 mmol/L (ref 98–111)
Creatinine, Ser: 0.9 mg/dL (ref 0.44–1.00)
GFR calc Af Amer: >60 mL/min (ref >60)
GLUCOSE: 95 mg/dL (ref 70–99)
POTASSIUM: 4.1 mmol/L (ref 3.5–5.1)
Sodium: 139 mmol/L (ref 135–145)
TOTAL PROTEIN: 7.3 g/dL (ref 6.5–8.1)

## 2018-11-01 LAB — CBC WITH DIFFERENTIAL/PLATELET
Abs Immature Granulocytes: 0.02 10*3/uL (ref 0.00–0.07)
BASOS ABS: 0 10*3/uL (ref 0.0–0.1)
Basophils Relative: 0 %
EOS ABS: 0.2 10*3/uL (ref 0.0–0.5)
Eosinophils Relative: 4 %
HEMATOCRIT: 42.1 % (ref 36.0–46.0)
Hemoglobin: 12.5 g/dL (ref 12.0–15.0)
Immature Granulocytes: 0 %
LYMPHS ABS: 1.8 10*3/uL (ref 0.7–4.0)
Lymphocytes Relative: 27 %
MCH: 24.8 pg — ABNORMAL LOW (ref 26.0–34.0)
MCHC: 29.7 g/dL — ABNORMAL LOW (ref 30.0–36.0)
MCV: 83.5 fL (ref 80.0–100.0)
Monocytes Absolute: 0.5 10*3/uL (ref 0.1–1.0)
Monocytes Relative: 7 %
NRBC: 0 % (ref 0.0–0.2)
Neutro Abs: 4.2 10*3/uL (ref 1.7–7.7)
Neutrophils Relative %: 62 %
Platelets: 284 10*3/uL (ref 150–400)
RBC: 5.04 MIL/uL (ref 3.87–5.11)
RDW: 14 % (ref 11.5–15.5)
WBC: 6.8 10*3/uL (ref 4.0–10.5)

## 2018-11-01 LAB — URINALYSIS, ROUTINE W REFLEX MICROSCOPIC
Bilirubin Urine: NEGATIVE
GLUCOSE, UA: NEGATIVE mg/dL
HGB URINE DIPSTICK: NEGATIVE
Ketones, ur: NEGATIVE mg/dL
NITRITE: NEGATIVE
PH: 6 (ref 5.0–8.0)
Protein, ur: NEGATIVE mg/dL
SPECIFIC GRAVITY, URINE: 1.021 (ref 1.005–1.030)

## 2018-11-01 LAB — TYPE AND SCREEN
ABO/RH(D): O POS
Antibody Screen: NEGATIVE

## 2018-11-01 LAB — I-STAT BETA HCG BLOOD, ED (MC, WL, AP ONLY): I-stat hCG, quantitative: <5 m[IU]/mL (ref <5)

## 2018-11-01 LAB — PROTIME-INR
INR: 1.04
Prothrombin Time: 13.5 seconds (ref 11.4–15.2)

## 2018-11-01 LAB — LIPASE, BLOOD: LIPASE: 31 U/L (ref 11–51)

## 2018-11-01 LAB — ABO/RH: ABO/RH(D): O POS

## 2018-11-01 MED ORDER — DIPHENHYDRAMINE HCL 25 MG PO TABS: 25.0000 mg | ORAL_TABLET | Freq: Four times a day (QID) | ORAL | 0 refills | Status: DC

## 2018-11-01 MED ORDER — ACETAMINOPHEN 325 MG PO TABS
650.0000 mg | ORAL_TABLET | Freq: Once | ORAL | Status: AC
  Administered 2018-11-01: 650 mg via ORAL
  Filled 2018-11-01: qty 2

## 2018-11-01 MED ORDER — HYDROCORTISONE 1 % EX CREA: TOPICAL_CREAM | CUTANEOUS | 0 refills | Status: DC

## 2018-11-01 MED ORDER — CEPHALEXIN 250 MG PO CAPS: 250.0000 mg | ORAL_CAPSULE | Freq: Four times a day (QID) | ORAL | 0 refills | Status: DC

## 2018-11-01 NOTE — ED Notes (Signed)
Pt aware of need for urine sample.  

## 2018-11-01 NOTE — ED Triage Notes (Signed)
Pt presents for rash on her chest that started on Thursday and has been getting progressively worse. Pt states she took benadryl yesterday with no relief. Pt denies itching, states the area burns, states "it feels like I got slapped with a hot iron". Pt denies trouble breathing. Pt states her roommate washed her towels, which she normally doesn't let her do, so she may have used a different detergent. Pt states she was at her PCP for abdominal pain and was told to come to the ED to be evaluated for her rash.
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
## 2018-11-01 NOTE — ED Notes (Signed)
Patient verbalizes understanding of discharge instructions. Opportunity for questioning and answers were provided. Armband removed by staff, pt discharged from ED.  

## 2018-11-01 NOTE — Discharge Instructions (Signed)
Take the medications as prescribed, follow up with your doctor next week to be rechecked, take over the counter medications as needed for pain

## 2019-09-02 ENCOUNTER — Emergency Department (HOSPITAL_COMMUNITY)
Admission: EM | Admit: 2019-09-02 | Discharge: 2019-09-02 | Disposition: A | Discharge status: Home / Self Care | Payer: Medicaid Other | Attending: Emergency Medicine | Admitting: Emergency Medicine

## 2019-09-02 ENCOUNTER — Other Ambulatory Visit: Payer: Self-pay

## 2019-09-02 ENCOUNTER — Encounter (HOSPITAL_COMMUNITY): Payer: Self-pay

## 2019-09-02 DIAGNOSIS — J45909 Unspecified asthma, uncomplicated: Secondary | ICD-10-CM | POA: Insufficient documentation

## 2019-09-02 DIAGNOSIS — Z79899 Other long term (current) drug therapy: Secondary | ICD-10-CM | POA: Diagnosis not present

## 2019-09-02 DIAGNOSIS — G43919 Migraine, unspecified, intractable, without status migrainosus: Secondary | ICD-10-CM | POA: Insufficient documentation

## 2019-09-02 DIAGNOSIS — K920 Hematemesis: Secondary | ICD-10-CM | POA: Diagnosis not present

## 2019-09-02 DIAGNOSIS — Z87891 Personal history of nicotine dependence: Secondary | ICD-10-CM | POA: Diagnosis not present

## 2019-09-02 LAB — CBC
HCT: 40.9 % (ref 36.0–46.0)
Hemoglobin: 12.9 g/dL (ref 12.0–15.0)
MCH: 26 pg (ref 26.0–34.0)
MCHC: 31.5 g/dL (ref 30.0–36.0)
MCV: 82.3 fL (ref 80.0–100.0)
Platelets: 321 10*3/uL (ref 150–400)
RBC: 4.97 MIL/uL (ref 3.87–5.11)
RDW: 14.6 % (ref 11.5–15.5)
WBC: 6.9 10*3/uL (ref 4.0–10.5)
nRBC: 0 % (ref 0.0–0.2)

## 2019-09-02 LAB — URINALYSIS, ROUTINE W REFLEX MICROSCOPIC
Bilirubin Urine: NEGATIVE
Glucose, UA: NEGATIVE mg/dL
Hgb urine dipstick: NEGATIVE
Ketones, ur: NEGATIVE mg/dL
Leukocytes,Ua: NEGATIVE
Nitrite: NEGATIVE
Protein, ur: NEGATIVE mg/dL
Specific Gravity, Urine: 1.019 (ref 1.005–1.030)
pH: 7 (ref 5.0–8.0)

## 2019-09-02 LAB — COMPREHENSIVE METABOLIC PANEL
ALT: 14 U/L (ref 0–44)
AST: 17 U/L (ref 15–41)
Albumin: 3.5 g/dL (ref 3.5–5.0)
Alkaline Phosphatase: 72 U/L (ref 38–126)
Anion gap: 7 (ref 5–15)
BUN: 13 mg/dL (ref 6–20)
CO2: 25 mmol/L (ref 22–32)
Calcium: 9.2 mg/dL (ref 8.9–10.3)
Chloride: 106 mmol/L (ref 98–111)
Creatinine, Ser: 0.85 mg/dL (ref 0.44–1.00)
GFR calc Af Amer: >60 mL/min (ref >60)
GFR calc non Af Amer: >60 mL/min (ref >60)
Glucose, Bld: 136 mg/dL — ABNORMAL HIGH (ref 70–99)
Potassium: 3.5 mmol/L (ref 3.5–5.1)
Sodium: 138 mmol/L (ref 135–145)
Total Bilirubin: 0.4 mg/dL (ref 0.3–1.2)
Total Protein: 7.1 g/dL (ref 6.5–8.1)

## 2019-09-02 LAB — WET PREP, GENITAL
Clue Cells Wet Prep HPF POC: NONE SEEN
Sperm: NONE SEEN
Trich, Wet Prep: NONE SEEN
Yeast Wet Prep HPF POC: NONE SEEN

## 2019-09-02 LAB — LIPASE, BLOOD: Lipase: 26 U/L (ref 11–51)

## 2019-09-02 LAB — I-STAT BETA HCG BLOOD, ED (MC, WL, AP ONLY): I-stat hCG, quantitative: <5 m[IU]/mL (ref <5)

## 2019-09-02 MED ORDER — HYOSCYAMINE SULFATE 0.125 MG SL SUBL
0.2500 mg | SUBLINGUAL_TABLET | Freq: Once | SUBLINGUAL | Status: AC
  Administered 2019-09-02: 0.25 mg via SUBLINGUAL
  Filled 2019-09-02: qty 2

## 2019-09-02 MED ORDER — FAMOTIDINE 20 MG PO TABS: 20.0000 mg | ORAL_TABLET | Freq: Two times a day (BID) | ORAL | 0 refills | Status: DC

## 2019-09-02 MED ORDER — SODIUM CHLORIDE 0.9% FLUSH: 3.0000 mL | Freq: Once | INTRAVENOUS | Status: DC

## 2019-09-02 MED ORDER — METOCLOPRAMIDE HCL 5 MG/ML IJ SOLN
10.0000 mg | Freq: Once | INTRAMUSCULAR | Status: AC
  Administered 2019-09-02: 20:00:00 10 mg via INTRAVENOUS
  Filled 2019-09-02: qty 2

## 2019-09-02 MED ORDER — DIPHENHYDRAMINE HCL 50 MG/ML IJ SOLN
12.5000 mg | Freq: Once | INTRAMUSCULAR | Status: AC
  Administered 2019-09-02: 20:00:00 12.5 mg via INTRAVENOUS
  Filled 2019-09-02: qty 1

## 2019-09-02 MED ORDER — HALOPERIDOL LACTATE 5 MG/ML IJ SOLN
5.0000 mg | Freq: Once | INTRAMUSCULAR | Status: AC
  Administered 2019-09-02: 5 mg via INTRAVENOUS
  Filled 2019-09-02: qty 1

## 2019-09-02 MED ORDER — SUCRALFATE 1 G PO TABS: 1.0000 g | ORAL_TABLET | Freq: Three times a day (TID) | ORAL | 0 refills | Status: DC

## 2019-09-02 MED ORDER — SUCRALFATE 1 G PO TABS
1.0000 g | ORAL_TABLET | Freq: Once | ORAL | Status: AC
  Administered 2019-09-02: 1 g via ORAL
  Filled 2019-09-02: qty 1

## 2019-09-02 NOTE — ED Notes (Signed)
ED Provider at bedside. 
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
## 2019-09-02 NOTE — Discharge Instructions (Signed)
Please follow strict Peptic Ulcer eating plan until you are able to make an appointment with GI. Do not take any NSAIDs or drink alcohol. I have provided you a number for a primary care doctor and for GI. I recommend calling them tomorrow to make appointments with both as soon as possible. Please return to the ED if you have worsening abdominal pain, severe vomiting with blood, or chest pain. I am sending you home with 2 new medications (pepcid and carafate). Please take as directed. I have switched your carafate from liquid to tablet. Only take the tablets now.

## 2019-09-02 NOTE — ED Triage Notes (Signed)
Patient complains of abdominal discomfort and vomiting blood. States that she has had this intermittently since hernia surgery 2 years ago. Alert and oriented, NAD

## 2019-09-04 LAB — GC/CHLAMYDIA PROBE AMP (~~LOC~~) NOT AT ARMC
Chlamydia: NEGATIVE
Neisseria Gonorrhea: NEGATIVE

## 2019-11-05 ENCOUNTER — Other Ambulatory Visit: Payer: Self-pay | Admitting: Family Medicine

## 2019-11-10 ENCOUNTER — Other Ambulatory Visit: Payer: Self-pay | Admitting: Family Medicine

## 2019-11-10 DIAGNOSIS — R102 Pelvic and perineal pain: Secondary | ICD-10-CM

## 2019-11-13 ENCOUNTER — Other Ambulatory Visit: Payer: Self-pay | Admitting: Emergency Medicine

## 2019-11-13 ENCOUNTER — Ambulatory Visit: Payer: Medicaid Other | Admitting: Gastroenterology

## 2019-11-18 ENCOUNTER — Other Ambulatory Visit (INDEPENDENT_AMBULATORY_CARE_PROVIDER_SITE_OTHER): Payer: Medicaid Other

## 2019-11-18 ENCOUNTER — Other Ambulatory Visit: Payer: Self-pay

## 2019-11-18 ENCOUNTER — Ambulatory Visit: Payer: Medicaid Other | Admitting: Physician Assistant

## 2019-11-18 ENCOUNTER — Encounter: Payer: Self-pay | Admitting: Physician Assistant

## 2019-11-18 VITALS — BP 120/70 | HR 64 | Temp 97.1°F | Ht 62.0 in | Wt 180.0 lb

## 2019-11-18 DIAGNOSIS — G8929 Other chronic pain: Secondary | ICD-10-CM

## 2019-11-18 DIAGNOSIS — Z8711 Personal history of peptic ulcer disease: Secondary | ICD-10-CM

## 2019-11-18 DIAGNOSIS — Z8719 Personal history of other diseases of the digestive system: Secondary | ICD-10-CM | POA: Diagnosis not present

## 2019-11-18 DIAGNOSIS — K920 Hematemesis: Secondary | ICD-10-CM | POA: Diagnosis not present

## 2019-11-18 DIAGNOSIS — R600 Localized edema: Secondary | ICD-10-CM

## 2019-11-18 DIAGNOSIS — R1013 Epigastric pain: Secondary | ICD-10-CM

## 2019-11-18 DIAGNOSIS — R519 Headache, unspecified: Secondary | ICD-10-CM

## 2019-11-18 DIAGNOSIS — K254 Chronic or unspecified gastric ulcer with hemorrhage: Secondary | ICD-10-CM

## 2019-11-18 LAB — CBC WITH DIFFERENTIAL/PLATELET
Basophils Absolute: 0 10*3/uL (ref 0.0–0.1)
Basophils Relative: 0.6 % (ref 0.0–3.0)
Eosinophils Absolute: 0.1 10*3/uL (ref 0.0–0.7)
Eosinophils Relative: 1 % (ref 0.0–5.0)
HCT: 39.2 % (ref 36.0–46.0)
Hemoglobin: 12.5 g/dL (ref 12.0–15.0)
Lymphocytes Relative: 29.7 % (ref 12.0–46.0)
Lymphs Abs: 2.1 10*3/uL (ref 0.7–4.0)
MCHC: 32 g/dL (ref 30.0–36.0)
MCV: 78.6 fl (ref 78.0–100.0)
Monocytes Absolute: 0.5 10*3/uL (ref 0.1–1.0)
Monocytes Relative: 7.4 % (ref 3.0–12.0)
Neutro Abs: 4.4 10*3/uL (ref 1.4–7.7)
Neutrophils Relative %: 61.3 % (ref 43.0–77.0)
Platelets: 310 10*3/uL (ref 150.0–400.0)
RBC: 4.98 Mil/uL (ref 3.87–5.11)
RDW: 14.4 % (ref 11.5–15.5)
WBC: 7.2 10*3/uL (ref 4.0–10.5)

## 2019-11-18 LAB — COMPREHENSIVE METABOLIC PANEL
ALT: 13 U/L (ref 0–35)
AST: 12 U/L (ref 0–37)
Albumin: 4.4 g/dL (ref 3.5–5.2)
Alkaline Phosphatase: 74 U/L (ref 39–117)
BUN: 14 mg/dL (ref 6–23)
CO2: 24 mEq/L (ref 19–32)
Calcium: 9.6 mg/dL (ref 8.4–10.5)
Chloride: 104 mEq/L (ref 96–112)
Creatinine, Ser: 0.76 mg/dL (ref 0.40–1.20)
GFR: 110.47 mL/min (ref >60.00)
Glucose, Bld: 86 mg/dL (ref 70–99)
Potassium: 3.8 mEq/L (ref 3.5–5.1)
Sodium: 137 mEq/L (ref 135–145)
Total Bilirubin: 0.4 mg/dL (ref 0.2–1.2)
Total Protein: 7.7 g/dL (ref 6.0–8.3)

## 2019-11-18 LAB — TSH: TSH: 0.75 u[IU]/mL (ref 0.35–4.50)

## 2019-11-18 LAB — HCG, QUANTITATIVE, PREGNANCY: Quantitative HCG: <0.6 m[IU]/mL

## 2019-11-18 LAB — SEDIMENTATION RATE: Sed Rate: 49 mm/hr — ABNORMAL HIGH (ref 0–20)

## 2019-11-18 MED ORDER — OMEPRAZOLE 40 MG PO CPDR: 40.0000 mg | DELAYED_RELEASE_CAPSULE | Freq: Every day | ORAL | 3 refills | Status: DC

## 2019-11-18 MED ORDER — SUCRALFATE 1 GM/10ML PO SUSP: 1.0000 g | Freq: Three times a day (TID) | ORAL | 1 refills | Status: DC

## 2019-11-18 NOTE — Patient Instructions (Addendum)
If you are age 27 or older, your body mass index should be between 23-30. Your Body mass index is 32.92 kg/m. If this is out of the aforementioned range listed, please consider follow up with your Primary Care Provider.  If you are age 42 or younger, your body mass index should be between 19-25. Your Body mass index is 32.92 kg/m. If this is out of the aformentioned range listed, please consider follow up with your Primary Care Provider.   Your provider has requested that you go to the basement level for lab work before leaving today. Press "B" on the elevator. The lab is located at the first door on the left as you exit the elevator.  You have been scheduled for an endoscopy. Please follow written instructions given to you at your visit today. If you use inhalers (even only as needed), please bring them with you on the day of your procedure.  We have sent the following medications to your pharmacy for you to pick up at your convenience: Carafate, Omeprazole.   STOP all aspirin, advil, aleve, ibuprofen.    Use plain Tylenol only  Referral placed to [facility] for migraines.

## 2019-11-18 NOTE — Progress Notes (Signed)
Subjective:    Patient ID: [name] [name], female    DOB: 12-Jan-1991, 28 y.o.   MRN: [id number]  HPI [name] is a pleasant 28 year old female, new to GI today referred by [facility] for evaluation of epigastric pain and hematemesis. Patient has history of chronic headaches, asthma, depression and peanut allergies. She has prior history of gastric ulcer diagnosed by EGD done in 2019 per Dr. Penelope Coop.  She was noted to have 1 small deep oozing cratered gastric ulcer which was treated with an Endo Clip.  Biopsy showed chronic inactive gastritis no H. pylori. She remembers taking medication for short period of time after that but had not been on anything over this past year.  [facility] and started on famotidine 20 mg p.o. twice daily and Carafate 1 g 4 times daily which she has been taking over the past week or so. She reports recurrent episodes of nausea and vomiting over the past couple of years usually occurring 1-3 times weekly.  This is increased recently and she says that intermittently she will vomit up a small amount of blood.  She showed me a picture today of her most recent episode that occurred last week.  There was a large amount of dark red blood in the commode.  She says she has not had any fevers or hematemesis since.  No complaints of melena.  No dysphagia or odynophagia. She admits to using a lot of Advil and Motrin usually on a daily basis for headaches.  She says she may get headache every couple of weeks but they will hang around for several days. She has not formally been diagnosed with migraines. She is also concerned about a rash that she has noticed on her chest and on her back recently nonpainful, somewhat pruritic.  She also mentions that she has frequent swelling of her lower extremities.  She says she has been told this is due to standing for long periods of time but says she is not doing any prolonged standing. Family  history negative for autoimmune disease as far she is aware.  Review of Systems Pertinent positive and negative review of systems were noted in the above HPI section.  All other review of systems was otherwise negative.  Outpatient Encounter Medications as of 11/18/2019  Medication Sig  . albuterol (PROAIR HFA) 108 (90 Base) MCG/ACT inhaler Inhale 2 puffs into the lungs every 6 (six) hours as needed for wheezing or shortness of breath.  . EPINEPHrine 0.3 mg/0.3 mL IJ SOAJ injection 0.3 mg as needed for anaphylaxis.  . famotidine (PEPCID) 20 MG tablet Take 1 tablet (20 mg total) by mouth 2 (two) times daily.  . medroxyPROGESTERone Acetate (DEPO-PROVERA IM) Inject 1 each into the muscle every 3 (three) months. Last injection May 2019  . pantoprazole (PROTONIX) 40 MG tablet Take 1 tablet (40 mg total) by mouth 2 (two) times daily before a meal.  . sucralfate (CARAFATE) 1 GM/10ML suspension Take 10 mLs (1 g total) by mouth 4 (four) times daily -  with meals and at bedtime.  . [DISCONTINUED] sucralfate (CARAFATE) 1 g tablet Take 1 tablet (1 g total) by mouth 4 (four) times daily -  with meals and at bedtime.  . [DISCONTINUED] sucralfate (CARAFATE) 1 GM/10ML suspension Take 10 mLs (1 g total) by mouth 4 (four) times daily -  with meals and at bedtime.  Marland Kitchen omeprazole (PRILOSEC) 40 MG capsule Take 1 capsule (40 mg total) by  mouth daily.  . [DISCONTINUED] diphenhydrAMINE (BENADRYL) 25 MG tablet Take 1 tablet (25 mg total) by mouth every 6 (six) hours. (Patient not taking: Reported on 09/02/2019)  . [DISCONTINUED] hydrocortisone cream 1 % Apply to affected area 2 times daily (Patient not taking: Reported on 09/02/2019)  . [DISCONTINUED] ondansetron (ZOFRAN) 8 MG tablet Take 1 tablet (8 mg total) by mouth every 8 (eight) hours as needed for nausea or vomiting. (Patient not taking: Reported on 09/02/2019)  . [DISCONTINUED] SUMAtriptan (IMITREX) 50 MG tablet Take 1 tablet (50 mg total) by mouth once as needed for  migraine. May repeat in 2 hours if headache persists or recurs. (Patient not taking: Reported on 09/02/2019)  . [DISCONTINUED] topiramate (TOPAMAX) 25 MG tablet Take 1 tablet (25 mg total) by mouth at bedtime. (Patient not taking: Reported on 09/02/2019)   No facility-administered encounter medications on file as of 11/18/2019.   Allergies  Allergen Reactions  . Iohexol Hives, Shortness Of Breath, Itching, Nausea And Vomiting, Swelling and Other (See Comments)    Also caused chest pain PER DR BARRY, PRE MED PROTOCOL SHOULD BE ADMINISTERED PRIOR TO SCANS IN THE FUTURE.  PT HAD A SIGNIFICANT REACTION.  [facility]  08/18/17 Patient had a reaction of SOB, swelling even though premedicated. Physician states no further contrast imaging in the future.  . Peanut-Containing Drug Products Anaphylaxis and Swelling    Facial swelling  . Other Swelling    Tree nuts cause facial swelling  . Sulfa Antibiotics Swelling    Facial swelling (reported by [facility] 2013)  . Iodinated Diagnostic Agents Rash   Patient Active Problem List   Diagnosis Date Noted  . Hx of gastric ulcer 11/18/2019  . 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 Gastritis and gastroduodenitis   . Nausea & vomiting 02/19/2018  . Abdominal pain 02/19/2018  . Hematemesis 09/10/2017  . Asthma 09/10/2017  . Headache(784.0) 09/10/2017  . Depression 09/10/2017  . Umbilical hernia 48/54/6270  . Vacuum extraction, delivered, current hospitalization 05/17/2012   Social History   Socioeconomic History  . Marital status: Single    Spouse name: Not on file  . Number of children: Not on file  . Years of education: Not on file  . Highest education level: Not on file  Occupational History  . Not on file  Tobacco Use  . Smoking status: Former Research scientist (life sciences)  . Smokeless tobacco: Never Used  Substance and Sexual Activity  . Alcohol use: Yes    Comment: rarely  . Drug use: No  . Sexual activity: Never  Other Topics Concern  . Not on file  Social  History Narrative  . Not on file   Social Determinants of Health   Financial Resource Strain:   . Difficulty of Paying Living Expenses: Not on file  Food Insecurity:   . Worried About Charity fundraiser in the Last Year: Not on file  . Ran Out of Food in the Last Year: Not on file  Transportation Needs:   . Lack of Transportation (Medical): Not on file  . Lack of Transportation (Non-Medical): Not on file  Physical Activity:   . Days of Exercise per Week: Not on file  . Minutes of Exercise per Session: Not on file  Stress:   . Feeling of Stress : Not on file  Social Connections:   . Frequency of Communication with Friends and Family: Not on file  . Frequency of Social Gatherings with Friends and Family: Not on file  . Attends Religious  Services: Not on file  . Active Member of Clubs or Organizations: Not on file  . Attends Archivist Meetings: Not on file  . Marital Status: Not on file  Intimate Partner Violence:   . Fear of Current or Ex-Partner: Not on file  . Emotionally Abused: Not on file  . Physically Abused: Not on file  . Sexually Abused: Not on file    Ms. Pinkstaff's family history includes CAD (age of onset: 17) in her mother; Hypertension in her mother; Stroke in her mother.      Objective:    Vitals:   11/18/19 1402  BP: 120/70  Pulse: 64  Temp: (!) 97.1 F (36.2 C)    Physical Exam Well-developed well-nourished young African-American female, accompanied by her child in no acute distress.  Height, Weight, 180 BMI 32.9  HEENT; nontraumatic normocephalic, EOMI, PER R LA, sclera anicteric. Oropharynx; not examined/mask/Covid Neck; supple, no JVD Cardiovascular; regular rate and rhythm with S1-S2, no murmur rub or gallop Pulmonary; Clear bilaterally Abdomen; soft, she is tender in the epigastrium, no guarding nondistended, no palpable mass or hepatosplenomegaly, bowel sounds are active Rectal; not done today Skin; benign exam, no jaundice rash  or appreciable lesions Extremities; no clubbing cyanosis or edema skin warm and dry Neuro/Psych; alert and oriented x4, grossly nonfocal mood and affect appropriate       Assessment & Plan:   #73 28 year old African-American female with prior history of gastric ulcer who is referred with recurrent epigastric pain, frequent nausea and vomiting and intermittent hematemesis. This is in the setting of chronic NSAID use for headaches.  Suspect recurrent peptic ulcer disease or chronic gastropathy  #2 status post umbilical hernia repair #3 frequent headaches-probably migraines #4 complaints of lower extremity edema-none present today  Plan; CBC with differential, C-Met, sed rate, TSH, ANA Continue Carafate 1 g p.o. 4 times daily between meals and at bedtime refill sent Start omeprazole 40 mg p.o. twice daily x1 month, then once daily longer-term. We discussed association between frequent NSAID use and ulcer disease.  Patient advised to stop all NSAIDs and aspirin products and use plain Tylenol/acetaminophen.  Patient will be scheduled for upper endoscopy with Dr. Carlean Purl.  Procedure was discussed in detail with the patient including indications risks and benefits and she is agreeable to proceed.  Will refer to low our neurology for headaches.  She may need prophylactic medication for migraines.  Work note was provided to cover her from today through day of her procedure.   Girtha Kilgore S Josedaniel Haye PA-C 11/18/2019   Cc: No ref. provider found

## 2019-11-22 NOTE — Progress Notes (Signed)
Please let pt know her labs are normal except sed rate elevated - nonspecific marker for inflammation , ANA pend

## 2019-11-23 ENCOUNTER — Other Ambulatory Visit: Payer: Self-pay | Admitting: Internal Medicine

## 2019-11-23 ENCOUNTER — Ambulatory Visit (INDEPENDENT_AMBULATORY_CARE_PROVIDER_SITE_OTHER): Payer: Medicaid Other

## 2019-11-23 DIAGNOSIS — Z1159 Encounter for screening for other viral diseases: Secondary | ICD-10-CM

## 2019-11-24 LAB — SARS CORONAVIRUS 2 (TAT 6-24 HRS): SARS Coronavirus 2: NEGATIVE

## 2019-11-25 ENCOUNTER — Ambulatory Visit (AMBULATORY_SURGERY_CENTER): Payer: Medicaid Other | Admitting: Internal Medicine

## 2019-11-25 ENCOUNTER — Other Ambulatory Visit: Payer: Self-pay

## 2019-11-25 ENCOUNTER — Encounter: Payer: Self-pay | Admitting: Internal Medicine

## 2019-11-25 VITALS — BP 104/71 | HR 64 | Temp 98.8°F | Resp 10 | Ht 62.0 in | Wt 180.0 lb

## 2019-11-25 DIAGNOSIS — K920 Hematemesis: Secondary | ICD-10-CM | POA: Diagnosis not present

## 2019-11-25 DIAGNOSIS — R112 Nausea with vomiting, unspecified: Secondary | ICD-10-CM

## 2019-11-25 DIAGNOSIS — R1013 Epigastric pain: Secondary | ICD-10-CM | POA: Diagnosis not present

## 2019-11-25 MED ORDER — SODIUM CHLORIDE 0.9 % IV SOLN: 500.0000 mL | Freq: Once | INTRAVENOUS | Status: DC

## 2019-11-25 MED ORDER — ONDANSETRON HCL 4 MG PO TABS: 4.0000 mg | ORAL_TABLET | Freq: Three times a day (TID) | ORAL | 1 refills | Status: DC | PRN

## 2019-11-25 MED ORDER — PROMETHAZINE HCL 25 MG PO TABS: 25.0000 mg | ORAL_TABLET | Freq: Four times a day (QID) | ORAL | 0 refills | Status: DC | PRN

## 2019-11-25 NOTE — Progress Notes (Signed)
PT taken to PACU. Monitors in place. VSS. Report given to RN. 

## 2019-11-25 NOTE — Op Note (Signed)
[facility] Patient Name: [name] [name] Procedure Date: 11/25/2019 10:22 AM MRN: [id number] Endoscopist: Gatha Mayer , MD Age: 28 Referring MD:  Date of Birth: [birth year] Gender: Female Account #: [id number] Procedure:                Upper GI endoscopy Indications:              Epigastric abdominal pain, Hematemesis, Nausea with                            vomiting Medicines:                Propofol per Anesthesia, Monitored Anesthesia Care Procedure:                Pre-Anesthesia Assessment:                           - Prior to the procedure, a History and Physical                            was performed, and patient medications and                            allergies were reviewed. The patient's tolerance of                            previous anesthesia was also reviewed. The risks                            and benefits of the procedure and the sedation                            options and risks were discussed with the patient.                            All questions were answered, and informed consent                            was obtained. Prior Anticoagulants: The patient has                            taken no previous anticoagulant or antiplatelet                            agents. ASA Grade Assessment: II - A patient with                            mild systemic disease. After reviewing the risks                            and benefits, the patient was deemed in                            satisfactory condition to undergo the procedure.  After obtaining informed consent, the endoscope was                            passed under direct vision. Throughout the                            procedure, the patient's blood pressure, pulse, and                            oxygen saturations were monitored continuously. The                            Endoscope was introduced through the mouth, and                            advanced to the  second part of duodenum. The upper                            GI endoscopy was accomplished without difficulty.                            The patient tolerated the procedure well. Scope In: Scope Out: Findings:                 An endoclip was found on the greater curvature of                            the stomach.                           The exam was otherwise without abnormality.                           The cardia and gastric fundus were normal on                            retroflexion. Complications:            No immediate complications. Estimated Blood Loss:     Estimated blood loss: none. Impression:               - An endoclip was found in the stomach.                           - The examination was otherwise normal.                           - No specimens collected. Recommendation:           - Patient has a contact number available for                            emergencies. The signs and symptoms of potential                            delayed complications were discussed with the  patient. Return to normal activities tomorrow.                            Written discharge instructions were provided to the                            patient.                           - Resume previous diet.                           - Continue present medications.                           - AVOID NSAIDS/SALICYLATES                           SEE NEUROLOGY FOR HEADACHES                           RX FOR ONDANSETRON AND PROMNETHAZINE TODAY TO HELP                            W/ NAUSEA AND VOMITING                           SUSPECT CHRONIC HEADACHES ARE HER MAIN ISSUE AND                            ABD PAIN IS WALL PAIN FROM VOMITING [name] Booparl E Aaryn Sermon, MD 11/25/2019 10:39:49 AM This report has been signed electronically.

## 2019-11-25 NOTE — Progress Notes (Signed)
Temp by JB, Vitals by CW 

## 2019-11-25 NOTE — Patient Instructions (Addendum)
The esophagus, stomach and upper intestine (duodenum) all look good.  I saw the clip that was placed last year (see photos) - that will likely fall off sometime but if it doesn't that's ok - its not a problem.  I updates your medication list - omeprazole should be taken daily. I am prescribing ondansetron to help with nausea and vomiting and some promethazine. Promethazine may make you sleepy so do not take if you need to drive, etc.  Please be sure to call about the neurology appointment. Getting your chronic headaches under control is very important.  I appreciate the opportunity to care for you. Gatha Mayer, MD, FACG YOU HAD AN ENDOSCOPIC PROCEDURE TODAY AT [facility]:   Refer to the procedure report that was given to you for any specific questions about what was found during the examination.  If the procedure report does not answer your questions, please call your gastroenterologist to clarify.  If you requested that your care partner not be given the details of your procedure findings, then the procedure report has been included in a sealed envelope for you to review at your convenience later.  YOU SHOULD EXPECT: Some feelings of bloating in the abdomen. Passage of more gas than usual.  Walking can help get rid of the air that was put into your GI tract during the procedure and reduce the bloating. If you had a lower endoscopy (such as a colonoscopy or flexible sigmoidoscopy) you may notice spotting of blood in your stool or on the toilet paper. If you underwent a bowel prep for your procedure, you may not have a normal bowel movement for a few days.  Please Note:  You might notice some irritation and congestion in your nose or some drainage.  This is from the oxygen used during your procedure.  There is no need for concern and it should clear up in a day or so.  SYMPTOMS TO REPORT IMMEDIATELY:    Following upper endoscopy (EGD)  Vomiting of blood or coffee ground  material  New chest pain or pain under the shoulder blades  Painful or persistently difficult swallowing  New shortness of breath  Fever of 100F or higher  Black, tarry-looking stools  For urgent or emergent issues, a gastroenterologist can be reached at any hour by calling [phone].   DIET:  We do recommend a small meal at first, but then you may proceed to your regular diet.  Drink plenty of fluids but you should avoid alcoholic beverages for 24 hours.  ACTIVITY:  You should plan to take it easy for the rest of today and you should NOT DRIVE or use heavy machinery until tomorrow (because of the sedation medicines used during the test).    FOLLOW UP: Our staff will call the number listed on your records 48-72 hours following your procedure to check on you and address any questions or concerns that you may have regarding the information given to you following your procedure. If we do not reach you, we will leave a message.  We will attempt to reach you two times.  During this call, we will ask if you have developed any symptoms of COVID 19. If you develop any symptoms (ie: fever, flu-like symptoms, shortness of breath, cough etc.) before then, please call [phone].  If you test positive for Covid 19 in the 2 weeks post procedure, please call and report this information to [place].    If any biopsies were taken you  will be contacted by phone or by letter within the next 1-3 weeks.  Please call us at [phone] if you have not heard about the biopsies in 3 weeks.    SIGNATURES/CONFIDENTIALITY: You and/or your care partner have signed paperwork which will be entered into your electronic medical record.  These signatures attest to the fact that that the information above on your After Visit Summary has been reviewed and is understood.  Full responsibility of the confidentiality of this discharge information lies with you and/or your care-partner.

## 2019-11-27 DIAGNOSIS — S99919A Unspecified injury of unspecified ankle, initial encounter: Secondary | ICD-10-CM

## 2019-11-27 HISTORY — DX: Unspecified injury of unspecified ankle, initial encounter: S99.919A

## 2019-11-30 ENCOUNTER — Encounter: Payer: Self-pay | Admitting: Neurology

## 2019-11-30 ENCOUNTER — Telehealth: Payer: Self-pay

## 2019-11-30 NOTE — Telephone Encounter (Signed)
  Follow up Call-  Call back number 11/25/2019  Post procedure Call Back phone  # [phone]  Permission to leave phone message Yes  Some recent data might be hidden     Patient questions:  Do you have a fever, pain , or abdominal swelling? No. Pain Score  0 *  Have you tolerated food without any problems? yes  Have you been able to return to your normal activities? yes  Do you have any questions about your discharge instructions: Diet   No. Medications  No. Follow up visit  No.  Do you have questions or concerns about your Care? No.  Actions: * If pain score is 4 or above: No action needed, pain <4.  1. Have you developed a fever since your procedure? no  2.   Have you had an respiratory symptoms (SOB or cough) since your procedure? no  3.   Have you tested positive for COVID 19 since your procedure no  4.   Have you had any family members/close contacts diagnosed with the COVID 19 since your procedure?  no   If yes to any of these questions please route to Laverna Peace, RN and Jennye Boroughs, Charity fundraiser.

## 2019-12-03 ENCOUNTER — Other Ambulatory Visit: Payer: Self-pay | Admitting: Family Medicine

## 2019-12-08 ENCOUNTER — Other Ambulatory Visit: Payer: Self-pay

## 2019-12-08 ENCOUNTER — Ambulatory Visit
Admission: RE | Admit: 2019-12-08 | Discharge: 2019-12-08 | Disposition: A | Discharge status: Home / Self Care | Payer: Medicaid Other | Source: Ambulatory Visit | Attending: Family Medicine | Admitting: Family Medicine

## 2019-12-08 DIAGNOSIS — K2970 Gastritis, unspecified, without bleeding: Secondary | ICD-10-CM

## 2019-12-08 DIAGNOSIS — R102 Pelvic and perineal pain: Secondary | ICD-10-CM

## 2019-12-08 DIAGNOSIS — K2990 Gastroduodenitis, unspecified, without bleeding: Secondary | ICD-10-CM
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
## 2020-01-06 ENCOUNTER — Encounter: Payer: Self-pay | Admitting: Neurology

## 2020-01-07 ENCOUNTER — Encounter: Payer: Self-pay | Admitting: Neurology

## 2020-01-07 ENCOUNTER — Telehealth (INDEPENDENT_AMBULATORY_CARE_PROVIDER_SITE_OTHER): Payer: Medicaid Other | Admitting: Neurology

## 2020-01-07 ENCOUNTER — Other Ambulatory Visit: Payer: Self-pay

## 2020-01-07 VITALS — Ht 62.0 in | Wt 183.0 lb

## 2020-01-07 DIAGNOSIS — G43019 Migraine without aura, intractable, without status migrainosus: Secondary | ICD-10-CM

## 2020-01-07 MED ORDER — SUMATRIPTAN SUCCINATE 100 MG PO TABS: ORAL_TABLET | ORAL | 3 refills | Status: DC

## 2020-01-07 MED ORDER — VENLAFAXINE HCL ER 75 MG PO CP24: 75.0000 mg | ORAL_CAPSULE | Freq: Every day | ORAL | 3 refills | Status: DC

## 2020-03-19 ENCOUNTER — Encounter (HOSPITAL_COMMUNITY): Payer: Self-pay

## 2020-03-19 ENCOUNTER — Emergency Department (HOSPITAL_COMMUNITY)
Admission: EM | Admit: 2020-03-19 | Discharge: 2020-03-19 | Disposition: A | Discharge status: Home / Self Care | Payer: Medicaid Other | Attending: Emergency Medicine | Admitting: Emergency Medicine

## 2020-03-19 DIAGNOSIS — Z79899 Other long term (current) drug therapy: Secondary | ICD-10-CM | POA: Insufficient documentation

## 2020-03-19 DIAGNOSIS — Z87891 Personal history of nicotine dependence: Secondary | ICD-10-CM | POA: Diagnosis not present

## 2020-03-19 DIAGNOSIS — R0602 Shortness of breath: Secondary | ICD-10-CM | POA: Diagnosis present

## 2020-03-19 DIAGNOSIS — T782XXA Anaphylactic shock, unspecified, initial encounter: Secondary | ICD-10-CM | POA: Insufficient documentation

## 2020-03-19 LAB — BASIC METABOLIC PANEL
Anion gap: 10 (ref 5–15)
BUN: 15 mg/dL (ref 6–20)
CO2: 22 mmol/L (ref 22–32)
Calcium: 9.1 mg/dL (ref 8.9–10.3)
Chloride: 107 mmol/L (ref 98–111)
Creatinine, Ser: 0.68 mg/dL (ref 0.44–1.00)
GFR calc Af Amer: >60 mL/min (ref >60)
GFR calc non Af Amer: >60 mL/min (ref >60)
Glucose, Bld: 88 mg/dL (ref 70–99)
Potassium: 3.7 mmol/L (ref 3.5–5.1)
Sodium: 139 mmol/L (ref 135–145)

## 2020-03-19 LAB — CBC WITH DIFFERENTIAL/PLATELET
Abs Immature Granulocytes: 0.01 10*3/uL (ref 0.00–0.07)
Basophils Absolute: 0.1 10*3/uL (ref 0.0–0.1)
Basophils Relative: 1 %
Eosinophils Absolute: 0.4 10*3/uL (ref 0.0–0.5)
Eosinophils Relative: 6 %
HCT: 39.8 % (ref 36.0–46.0)
Hemoglobin: 12.3 g/dL (ref 12.0–15.0)
Immature Granulocytes: 0 %
Lymphocytes Relative: 27 %
Lymphs Abs: 1.7 10*3/uL (ref 0.7–4.0)
MCH: 25.2 pg — ABNORMAL LOW (ref 26.0–34.0)
MCHC: 30.9 g/dL (ref 30.0–36.0)
MCV: 81.4 fL (ref 80.0–100.0)
Monocytes Absolute: 0.9 10*3/uL (ref 0.1–1.0)
Monocytes Relative: 14 %
Neutro Abs: 3.4 10*3/uL (ref 1.7–7.7)
Neutrophils Relative %: 52 %
Platelets: 302 10*3/uL (ref 150–400)
RBC: 4.89 MIL/uL (ref 3.87–5.11)
RDW: 14.6 % (ref 11.5–15.5)
WBC: 6.4 10*3/uL (ref 4.0–10.5)
nRBC: 0 % (ref 0.0–0.2)

## 2020-03-19 LAB — I-STAT BETA HCG BLOOD, ED (MC, WL, AP ONLY): I-stat hCG, quantitative: <5 m[IU]/mL (ref <5)

## 2020-03-19 MED ORDER — DIPHENHYDRAMINE HCL 50 MG/ML IJ SOLN
25.0000 mg | Freq: Once | INTRAMUSCULAR | Status: AC
  Administered 2020-03-19: 25 mg via INTRAVENOUS
  Filled 2020-03-19: qty 1

## 2020-03-19 MED ORDER — EPINEPHRINE 0.3 MG/0.3ML IJ SOAJ: 0.3000 mg | INTRAMUSCULAR | 3 refills | Status: DC | PRN

## 2020-03-19 MED ORDER — FAMOTIDINE IN NACL 20-0.9 MG/50ML-% IV SOLN
20.0000 mg | Freq: Once | INTRAVENOUS | Status: AC
Start: 2020-03-19 — End: 2020-03-19
  Administered 2020-03-19: 20 mg via INTRAVENOUS
  Filled 2020-03-19: qty 50

## 2020-03-19 MED ORDER — METHYLPREDNISOLONE SODIUM SUCC 125 MG IJ SOLR
125.0000 mg | Freq: Once | INTRAMUSCULAR | Status: AC
Start: 2020-03-19 — End: 2020-03-19
  Administered 2020-03-19: 125 mg via INTRAVENOUS
  Filled 2020-03-19: qty 2

## 2020-03-19 NOTE — ED Notes (Signed)
Pt reporting that she is able to swallow.

## 2020-03-19 NOTE — ED Triage Notes (Signed)
Pt brought in by friend. 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 Friend administered epi pen in left leg and brought her to the hospital.
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
## 2020-03-19 NOTE — Discharge Instructions (Signed)
You were seen in the emergency department for facial swelling shortness of breath in the setting of a peanut exposure.  You gave yourself a dose of epinephrine and we gave you steroids and other allergy medicine.  You were observed here for 4 hours with improvement in your symptoms.  Please fill your prescriptions and finish out 4 more days of prednisone.  Drink plenty of fluids.  If you experience any worsening of your symptoms return to the emergency department
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
## 2020-05-13 ENCOUNTER — Encounter: Payer: Self-pay | Admitting: Neurology

## 2020-05-13 ENCOUNTER — Other Ambulatory Visit: Payer: Self-pay

## 2020-05-13 ENCOUNTER — Ambulatory Visit (INDEPENDENT_AMBULATORY_CARE_PROVIDER_SITE_OTHER): Payer: Medicaid Other | Admitting: Neurology

## 2020-05-13 VITALS — BP 117/78 | HR 91 | Resp 18 | Ht 62.0 in | Wt 177.0 lb

## 2020-05-13 DIAGNOSIS — G43019 Migraine without aura, intractable, without status migrainosus: Secondary | ICD-10-CM

## 2020-05-13 MED ORDER — PROMETHAZINE HCL 25 MG PO TABS: 25.0000 mg | ORAL_TABLET | Freq: Four times a day (QID) | ORAL | 3 refills | Status: DC | PRN

## 2020-05-13 MED ORDER — AIMOVIG 70 MG/ML ~~LOC~~ SOAJ: 70.0000 mg | SUBCUTANEOUS | 11 refills | Status: DC

## 2020-05-13 MED ORDER — DIPHENHYDRAMINE HCL 50 MG/ML IJ SOLN
25.0000 mg | Freq: Once | INTRAMUSCULAR | Status: AC
  Administered 2020-05-13: 25 mg via INTRAVENOUS

## 2020-05-13 MED ORDER — KETOROLAC TROMETHAMINE 60 MG/2ML IM SOLN: 60.0000 mg | Freq: Once | INTRAMUSCULAR | Status: AC

## 2020-05-13 MED ORDER — KETOROLAC TROMETHAMINE 60 MG/2ML IM SOLN
60.0000 mg | Freq: Once | INTRAMUSCULAR | Status: AC
  Administered 2020-05-13: 60 mg via INTRAMUSCULAR

## 2020-05-13 MED ORDER — METOCLOPRAMIDE HCL 5 MG/ML IJ SOLN
10.0000 mg | Freq: Once | INTRAVENOUS | Status: AC
  Administered 2020-05-13: 10 mg via INTRAMUSCULAR

## 2020-05-13 MED ORDER — RIZATRIPTAN BENZOATE 10 MG PO TBDP: ORAL_TABLET | ORAL | 3 refills | Status: DC

## 2020-05-13 NOTE — Patient Instructions (Signed)
1.  We will give you a migraine cocktail today (toradol, Reglan, Benadryl).  You must have somebody drive you home due to drowsiness. 2.  Start Aimovig 70mg  injection every 28 days.   3.  Stop venlafaxine 4.  At earliest onset of migraine, take rizatriptan 10mg  as directed.  Use Phenergan for nausea. 5.  Stop sumatriptan 6.  Follow up in 4 months.

## 2020-07-20 ENCOUNTER — Encounter: Payer: Self-pay | Admitting: Neurology

## 2020-07-20 NOTE — Progress Notes (Signed)
Remigio Eisenmenger (KeyLoel Lofty) Rx #: [id number] Aimovig 70MG /ML auto-injectors   Form [facility] Standard Drug Request Form  Plan Contact [phone] phone [phone] fax Created 1 hour ago Sent to Plan less than a minute ago Determination Wait for Determination Please wait for the payer to return a determination.

## 2020-07-21 NOTE — Progress Notes (Signed)
Per fax received from [facility]- patient has benefits through [employer]. I called them and patient's benefits actually ran out on 04/25/20. I called patient to let her know that she will need to call member services @ [employer] ( I gave her the # they provided me- [phone]) to let them know about this. They said if patient was to call in they could expedite it because it is holding up her medication. Patient stated she was going to call them. I will follow up with [employer] tomorrow (07/22/20) to check status.

## 2020-07-27 ENCOUNTER — Emergency Department (HOSPITAL_COMMUNITY)
Admission: EM | Admit: 2020-07-27 | Discharge: 2020-07-27 | Disposition: A | Discharge status: Home / Self Care | Payer: Medicaid Other | Attending: Emergency Medicine | Admitting: Emergency Medicine

## 2020-07-27 ENCOUNTER — Other Ambulatory Visit: Payer: Self-pay

## 2020-07-27 ENCOUNTER — Encounter (HOSPITAL_COMMUNITY): Payer: Self-pay

## 2020-07-27 DIAGNOSIS — Z87891 Personal history of nicotine dependence: Secondary | ICD-10-CM | POA: Insufficient documentation

## 2020-07-27 DIAGNOSIS — J45909 Unspecified asthma, uncomplicated: Secondary | ICD-10-CM | POA: Diagnosis not present

## 2020-07-27 DIAGNOSIS — Z7951 Long term (current) use of inhaled steroids: Secondary | ICD-10-CM | POA: Diagnosis not present

## 2020-07-27 DIAGNOSIS — H9202 Otalgia, left ear: Secondary | ICD-10-CM | POA: Diagnosis not present

## 2020-07-27 MED ORDER — NEOMYCIN-POLYMYXIN-HC 3.5-10000-1 OT SUSP: 4.0000 [drp] | Freq: Three times a day (TID) | OTIC | 0 refills | Status: DC

## 2020-07-27 NOTE — ED Triage Notes (Signed)
Pt c/o left ear pain x1 week. Denies any other symptoms. No meds PTA

## 2020-07-27 NOTE — Discharge Instructions (Signed)
Return to the ED with any concerns including increased pain, redness around or behind ear, tenderness behind ear, or any other alarming symptoms
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
## 2020-07-27 NOTE — ED Notes (Signed)
patient awake alert,color pink,chest clear,good aeration,noreatrctions 3plus pulses<2sec refill, patient ambulatory to wr after avs reviewed

## 2020-07-28 NOTE — Progress Notes (Signed)
Received fax from [employer]- Aimovig 70mg  approval valid from 07/28/20 to 10/27/20.

## 2020-08-01 ENCOUNTER — Ambulatory Visit
Admission: EM | Admit: 2020-08-01 | Discharge: 2020-08-01 | Disposition: A | Discharge status: Home / Self Care | Payer: Medicaid Other | Attending: Family Medicine | Admitting: Family Medicine

## 2020-08-01 ENCOUNTER — Encounter: Payer: Self-pay | Admitting: Emergency Medicine

## 2020-08-01 ENCOUNTER — Ambulatory Visit (INDEPENDENT_AMBULATORY_CARE_PROVIDER_SITE_OTHER): Payer: Medicaid Other

## 2020-08-01 ENCOUNTER — Other Ambulatory Visit: Payer: Self-pay

## 2020-08-01 DIAGNOSIS — R2242 Localized swelling, mass and lump, left lower limb: Secondary | ICD-10-CM

## 2020-08-01 DIAGNOSIS — S92515A Nondisplaced fracture of proximal phalanx of left lesser toe(s), initial encounter for closed fracture: Secondary | ICD-10-CM

## 2020-08-01 DIAGNOSIS — M79672 Pain in left foot: Secondary | ICD-10-CM

## 2020-08-01 DIAGNOSIS — W228XXA Striking against or struck by other objects, initial encounter: Secondary | ICD-10-CM

## 2020-08-01 MED ORDER — IBUPROFEN 800 MG PO TABS: 800.0000 mg | ORAL_TABLET | Freq: Three times a day (TID) | ORAL | 0 refills | Status: DC

## 2020-08-01 NOTE — Discharge Instructions (Addendum)
Follow-up with orthopedics. Call their office to schedule an appt. Your work may require that you follow-up with [facility] before return to work.

## 2020-08-01 NOTE — ED Provider Notes (Signed)
[facility]    CSN: [id number] Arrival date & time: 08/01/20  1633      History   Chief Complaint Chief Complaint  Patient presents with  . Foot Pain  . Ankle Pain    HPI [name] [name] is a 29 y.o. female.   HPI  WORKER'S COMPENSATION RELATED INJURY  Patient was working today and pushing her work cart. Reports the work cart rolled into there left foot making direct contact by rolling over her foot. Pain initially present distal 3-5 th metatarsal.  Pain was initially tolerable. As they day progressed, pain worsened and she noticed swelling bruising affecting the left lateral foot extend below the 4-5th metatarsals  She has applied ice and taken ibuprofen.   Allergies   Iohexol, Peanut-containing drug products, Other, Sulfa antibiotics, and Iodinated diagnostic agents   Review of Systems Review of Systems Pertinent negatives listed in HPI   Physical Exam Triage Vital Signs ED Triage Vitals  Enc Vitals Group     BP 08/01/20 1830 119/71     Pulse Rate 08/01/20 1830 80     Resp 08/01/20 1830 18     Temp 08/01/20 1830 97.7 F (36.5 C)     Temp Source 08/01/20 1830 Oral     SpO2 08/01/20 1830 97 %     Weight --      Height --      Head Circumference --      Peak Flow --      Pain Score 08/01/20 1852 8     Pain Loc --      Pain Edu? --      Excl. in GC? --    No data found.  Updated Vital Signs BP 119/71 (BP Location: Right Arm)   Pulse 80   Temp 97.7 F (36.5 C) (Oral)   Resp 18   SpO2 97%   Visual Acuity Right Eye Distance:   Left Eye Distance:   Bilateral Distance:    Right Eye Near:   Left Eye Near:    Bilateral Near:     Physical Exam Constitutional:      Appearance: Normal appearance.  Cardiovascular:     Pulses:          Dorsalis pedis pulses are 2+ on the left side.  Musculoskeletal:       Feet:  Feet:     Left foot:     Toenail Condition: Left toenails are normal.  Neurological:     Mental Status: She is alert.       UC Treatments / Results  Labs (all labs ordered are listed, but only abnormal results are displayed) Labs Reviewed - No data to display  EKG   Radiology DG Foot Complete Left  Result Date: 08/01/2020 CLINICAL DATA:  Left foot pain and swelling for 1 day after accidentally kicking a metal cart. Pain about the metatarsals. EXAM: LEFT FOOT - COMPLETE 3+ VIEW COMPARISON:  None. FINDINGS: Minimal cortical buckling about the distal fourth metatarsal may represent a nondisplaced fracture. No other fracture of the foot. Normal alignment and joint spaces. Mild dorsal soft tissue edema overlies the metatarsals. IMPRESSION: Minimal cortical buckling about the distal fourth metatarsal may represent a nondisplaced fracture. Electronically Signed   By: Narda Rutherford M.D.   On: 08/01/2020 19:12    Procedures Procedures (including critical care time)  Medications Ordered in UC Medications - No data to display  Initial Impression / Assessment and Plan / UC Course  I have  reviewed the triage vital signs and the nursing notes.  Pertinent labs & imaging results that were available during my care of the patient were reviewed by me and considered in my medical decision making (see chart for details).     Closed non-displaced fracture 4th metatarsal. Buddy tape 4th and 5th toe. Post op shoe ordered. Contact orthopedics to schedule follow-up. Occupational health will manage return to work given injury is related to work's comp. Work note provided. Ibuprofen 800 mg every 8 hours prn for pain  Final Clinical Impressions(s) / UC Diagnoses   Final diagnoses:  Closed nondisplaced fracture of proximal phalanx of lesser toe of left foot, initial encounter     Discharge Instructions     Follow-up with orthopedics. Call their office to schedule an appt. Your work may require that you follow-up with [facility] before return to work.   ED Prescriptions    Medication Sig Dispense  Auth. Provider   ibuprofen (ADVIL) 800 MG tablet  (Status: Discontinued) Take 1 tablet (800 mg total) by mouth 3 (three) times daily. 21 tablet Bing Neighbors, FNP   ibuprofen (ADVIL) 800 MG tablet Take 1 tablet (800 mg total) by mouth 3 (three) times daily. 21 tablet Bing Neighbors, FNP     PDMP not reviewed this encounter.   Bing Neighbors, FNP 08/09/20 1858

## 2020-08-01 NOTE — ED Triage Notes (Signed)
Patient has pain and swelling to left foot and ankle.  Reports ankle swelling has improved throughout the day.  Patient says she hit foot on EVS cart that she uses at work.  Patient is able to move toes.  Patient has good pulse, 2+ in left foot.  Patient reports she continued to work on foot after injury

## 2020-09-30 ENCOUNTER — Encounter: Payer: Self-pay | Admitting: Neurology

## 2020-09-30 NOTE — Progress Notes (Addendum)
Submitted PA through [facility] which was denied. 11/5- sent paperwork to appeal department for insurance to start appeal process.    However, PA already on file so not sure why having to resubmit. Per previous message: Received fax from [employer]- Aimovig 70mg  approval valid from 07/28/20 to 10/27/20.   11/9- received new approval fax valid from 10/03/20 to 10/03/21.

## 2020-10-06 ENCOUNTER — Telehealth (INDEPENDENT_AMBULATORY_CARE_PROVIDER_SITE_OTHER): Payer: Medicaid Other | Admitting: Neurology

## 2020-10-06 ENCOUNTER — Other Ambulatory Visit: Payer: Self-pay

## 2020-10-06 ENCOUNTER — Encounter: Payer: Self-pay | Admitting: Neurology

## 2020-10-06 VITALS — Ht 62.0 in | Wt 180.0 lb

## 2020-10-06 DIAGNOSIS — G43009 Migraine without aura, not intractable, without status migrainosus: Secondary | ICD-10-CM

## 2020-10-06 MED ORDER — PROMETHAZINE HCL 25 MG PO TABS: 25.0000 mg | ORAL_TABLET | Freq: Four times a day (QID) | ORAL | 5 refills | Status: DC | PRN

## 2020-10-06 MED ORDER — RIZATRIPTAN BENZOATE 10 MG PO TBDP: ORAL_TABLET | ORAL | 5 refills | Status: DC

## 2020-10-06 MED ORDER — AIMOVIG 140 MG/ML ~~LOC~~ SOAJ: 140.0000 mg | SUBCUTANEOUS | 5 refills | Status: DC
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
## 2021-01-25 ENCOUNTER — Emergency Department (HOSPITAL_COMMUNITY): Payer: Medicaid Other

## 2021-01-25 ENCOUNTER — Emergency Department (HOSPITAL_COMMUNITY)
Admission: EM | Admit: 2021-01-25 | Discharge: 2021-01-25 | Disposition: A | Discharge status: Home / Self Care | Payer: Medicaid Other | Attending: Emergency Medicine | Admitting: Emergency Medicine

## 2021-01-25 ENCOUNTER — Encounter (HOSPITAL_COMMUNITY): Payer: Self-pay | Admitting: Emergency Medicine

## 2021-01-25 DIAGNOSIS — Z87891 Personal history of nicotine dependence: Secondary | ICD-10-CM | POA: Insufficient documentation

## 2021-01-25 DIAGNOSIS — Z91010 Allergy to peanuts: Secondary | ICD-10-CM | POA: Diagnosis not present

## 2021-01-25 DIAGNOSIS — J45909 Unspecified asthma, uncomplicated: Secondary | ICD-10-CM | POA: Diagnosis not present

## 2021-01-25 DIAGNOSIS — R112 Nausea with vomiting, unspecified: Secondary | ICD-10-CM | POA: Insufficient documentation

## 2021-01-25 DIAGNOSIS — R109 Unspecified abdominal pain: Secondary | ICD-10-CM

## 2021-01-25 DIAGNOSIS — R1084 Generalized abdominal pain: Secondary | ICD-10-CM | POA: Insufficient documentation

## 2021-01-25 LAB — CBC
HCT: 39.4 % (ref 36.0–46.0)
Hemoglobin: 12.2 g/dL (ref 12.0–15.0)
MCH: 25.5 pg — ABNORMAL LOW (ref 26.0–34.0)
MCHC: 31 g/dL (ref 30.0–36.0)
MCV: 82.4 fL (ref 80.0–100.0)
Platelets: 307 10*3/uL (ref 150–400)
RBC: 4.78 MIL/uL (ref 3.87–5.11)
RDW: 13.9 % (ref 11.5–15.5)
WBC: 9.5 10*3/uL (ref 4.0–10.5)
nRBC: 0 % (ref 0.0–0.2)

## 2021-01-25 LAB — COMPREHENSIVE METABOLIC PANEL
ALT: 16 U/L (ref 0–44)
AST: 19 U/L (ref 15–41)
Albumin: 4.1 g/dL (ref 3.5–5.0)
Alkaline Phosphatase: 64 U/L (ref 38–126)
Anion gap: 11 (ref 5–15)
BUN: 20 mg/dL (ref 6–20)
CO2: 22 mmol/L (ref 22–32)
Calcium: 9.4 mg/dL (ref 8.9–10.3)
Chloride: 103 mmol/L (ref 98–111)
Creatinine, Ser: 0.95 mg/dL (ref 0.44–1.00)
GFR, Estimated: >60 mL/min (ref >60)
Glucose, Bld: 120 mg/dL — ABNORMAL HIGH (ref 70–99)
Potassium: 4.2 mmol/L (ref 3.5–5.1)
Sodium: 136 mmol/L (ref 135–145)
Total Bilirubin: 0.8 mg/dL (ref 0.3–1.2)
Total Protein: 7.7 g/dL (ref 6.5–8.1)

## 2021-01-25 LAB — URINALYSIS, ROUTINE W REFLEX MICROSCOPIC
Bilirubin Urine: NEGATIVE
Glucose, UA: NEGATIVE mg/dL
Hgb urine dipstick: NEGATIVE
Ketones, ur: NEGATIVE mg/dL
Leukocytes,Ua: NEGATIVE
Nitrite: NEGATIVE
Protein, ur: NEGATIVE mg/dL
Specific Gravity, Urine: 1.028 (ref 1.005–1.030)
pH: 8 (ref 5.0–8.0)

## 2021-01-25 LAB — I-STAT BETA HCG BLOOD, ED (MC, WL, AP ONLY): I-stat hCG, quantitative: <5 m[IU]/mL (ref <5)

## 2021-01-25 LAB — LIPASE, BLOOD: Lipase: 23 U/L (ref 11–51)

## 2021-01-25 MED ORDER — PROMETHAZINE HCL 25 MG PO TABS: 25.0000 mg | ORAL_TABLET | Freq: Three times a day (TID) | ORAL | 5 refills | Status: DC | PRN

## 2021-01-25 MED ORDER — SODIUM CHLORIDE 0.9 % IV BOLUS
1000.0000 mL | Freq: Once | INTRAVENOUS | Status: AC
  Administered 2021-01-25: 1000 mL via INTRAVENOUS

## 2021-01-25 MED ORDER — FENTANYL CITRATE (PF) 100 MCG/2ML IJ SOLN
50.0000 ug | Freq: Once | INTRAMUSCULAR | Status: AC
  Administered 2021-01-25: 50 ug via INTRAVENOUS
  Filled 2021-01-25: qty 2

## 2021-01-25 MED ORDER — PROMETHAZINE HCL 25 MG/ML IJ SOLN
12.5000 mg | Freq: Once | INTRAMUSCULAR | Status: AC
  Administered 2021-01-25: 12.5 mg via INTRAVENOUS
  Filled 2021-01-25: qty 1

## 2021-01-25 NOTE — Discharge Instructions (Addendum)
Your work-up was reassuring for your abdominal pain.  Does not show obstruction and the labs are reassuring.  Follow-up with your doctor including a gastroenterologist as needed.  Try and take care with the NSAIDs that you are taking for the foot because it can make the abdominal pain worse.
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
## 2021-01-26 ENCOUNTER — Observation Stay (HOSPITAL_COMMUNITY)
Admission: EM | Admit: 2021-01-26 | Discharge: 2021-01-27 | Disposition: A | Discharge status: Home / Self Care | Payer: Medicaid Other | Attending: Student in an Organized Health Care Education/Training Program | Admitting: Student in an Organized Health Care Education/Training Program

## 2021-01-26 ENCOUNTER — Encounter (HOSPITAL_COMMUNITY): Payer: Self-pay

## 2021-01-26 ENCOUNTER — Emergency Department (HOSPITAL_COMMUNITY): Payer: Medicaid Other

## 2021-01-26 DIAGNOSIS — N200 Calculus of kidney: Principal | ICD-10-CM | POA: Insufficient documentation

## 2021-01-26 DIAGNOSIS — G43909 Migraine, unspecified, not intractable, without status migrainosus: Secondary | ICD-10-CM | POA: Insufficient documentation

## 2021-01-26 DIAGNOSIS — K5900 Constipation, unspecified: Secondary | ICD-10-CM | POA: Insufficient documentation

## 2021-01-26 DIAGNOSIS — J45909 Unspecified asthma, uncomplicated: Secondary | ICD-10-CM | POA: Insufficient documentation

## 2021-01-26 DIAGNOSIS — N23 Unspecified renal colic: Secondary | ICD-10-CM

## 2021-01-26 DIAGNOSIS — Z20822 Contact with and (suspected) exposure to covid-19: Secondary | ICD-10-CM | POA: Insufficient documentation

## 2021-01-26 DIAGNOSIS — F17290 Nicotine dependence, other tobacco product, uncomplicated: Secondary | ICD-10-CM | POA: Insufficient documentation

## 2021-01-26 DIAGNOSIS — R109 Unspecified abdominal pain: Secondary | ICD-10-CM | POA: Diagnosis present

## 2021-01-26 DIAGNOSIS — N1330 Unspecified hydronephrosis: Secondary | ICD-10-CM | POA: Diagnosis present

## 2021-01-26 DIAGNOSIS — Z79899 Other long term (current) drug therapy: Secondary | ICD-10-CM | POA: Insufficient documentation

## 2021-01-26 HISTORY — DX: Calculus of kidney: N20.0

## 2021-01-26 LAB — CREATININE, SERUM
Creatinine, Ser: 1.15 mg/dL — ABNORMAL HIGH (ref 0.44–1.00)
GFR, Estimated: >60 mL/min (ref >60)

## 2021-01-26 LAB — CBC
HCT: 37.3 % (ref 36.0–46.0)
Hemoglobin: 12 g/dL (ref 12.0–15.0)
MCH: 26.3 pg (ref 26.0–34.0)
MCHC: 32.2 g/dL (ref 30.0–36.0)
MCV: 81.8 fL (ref 80.0–100.0)
Platelets: 291 10*3/uL (ref 150–400)
RBC: 4.56 MIL/uL (ref 3.87–5.11)
RDW: 14.1 % (ref 11.5–15.5)
WBC: 13 10*3/uL — ABNORMAL HIGH (ref 4.0–10.5)
nRBC: 0 % (ref 0.0–0.2)

## 2021-01-26 LAB — RESP PANEL BY RT-PCR (FLU A&B, COVID) ARPGX2
Influenza A by PCR: NEGATIVE
Influenza B by PCR: NEGATIVE
SARS Coronavirus 2 by RT PCR: NEGATIVE

## 2021-01-26 LAB — HIV ANTIBODY (ROUTINE TESTING W REFLEX): HIV Screen 4th Generation wRfx: NONREACTIVE

## 2021-01-26 MED ORDER — IBUPROFEN 200 MG PO TABS
600.0000 mg | ORAL_TABLET | Freq: Four times a day (QID) | ORAL | Status: DC | PRN
  Administered 2021-01-27 (×2): 600 mg via ORAL
  Filled 2021-01-26 (×2): qty 3

## 2021-01-26 MED ORDER — ALBUTEROL SULFATE HFA 108 (90 BASE) MCG/ACT IN AERS: 2.0000 | INHALATION_SPRAY | Freq: Four times a day (QID) | RESPIRATORY_TRACT | Status: DC | PRN

## 2021-01-26 MED ORDER — SENNOSIDES-DOCUSATE SODIUM 8.6-50 MG PO TABS
1.0000 | ORAL_TABLET | Freq: Two times a day (BID) | ORAL | Status: DC
  Administered 2021-01-26 – 2021-01-27 (×3): 1 via ORAL
  Filled 2021-01-26 (×3): qty 1

## 2021-01-26 MED ORDER — HYDROMORPHONE HCL 1 MG/ML IJ SOLN
1.0000 mg | Freq: Once | INTRAMUSCULAR | Status: AC
Start: 2021-01-26 — End: 2021-01-26
  Administered 2021-01-26: 1 mg via INTRAVENOUS
  Filled 2021-01-26: qty 1

## 2021-01-26 MED ORDER — ONDANSETRON HCL 4 MG/2ML IJ SOLN
4.0000 mg | Freq: Once | INTRAMUSCULAR | Status: AC
  Administered 2021-01-26: 4 mg via INTRAVENOUS
  Filled 2021-01-26: qty 2

## 2021-01-26 MED ORDER — ENOXAPARIN SODIUM 40 MG/0.4ML ~~LOC~~ SOLN
40.0000 mg | SUBCUTANEOUS | Status: DC
  Administered 2021-01-26 – 2021-01-27 (×2): 40 mg via SUBCUTANEOUS
  Filled 2021-01-26 (×2): qty 0.4

## 2021-01-26 MED ORDER — IBUPROFEN 400 MG PO TABS: 400.0000 mg | ORAL_TABLET | Freq: Four times a day (QID) | ORAL | Status: DC | PRN

## 2021-01-26 MED ORDER — ONDANSETRON HCL 4 MG/2ML IJ SOLN
4.0000 mg | Freq: Four times a day (QID) | INTRAMUSCULAR | Status: DC | PRN
  Administered 2021-01-27: 4 mg via INTRAVENOUS
  Filled 2021-01-26: qty 2

## 2021-01-26 MED ORDER — KETOROLAC TROMETHAMINE 30 MG/ML IJ SOLN
30.0000 mg | Freq: Once | INTRAMUSCULAR | Status: AC
  Administered 2021-01-26: 30 mg via INTRAVENOUS
  Filled 2021-01-26: qty 1

## 2021-01-26 MED ORDER — HYDROMORPHONE HCL 1 MG/ML IJ SOLN
0.5000 mg | INTRAMUSCULAR | Status: DC | PRN
  Administered 2021-01-26 – 2021-01-27 (×4): 0.5 mg via INTRAVENOUS
  Filled 2021-01-26 (×4): qty 0.5

## 2021-01-26 MED ORDER — PROMETHAZINE HCL 25 MG/ML IJ SOLN
25.0000 mg | Freq: Once | INTRAMUSCULAR | Status: AC
  Administered 2021-01-26: 25 mg via INTRAVENOUS
  Filled 2021-01-26: qty 1

## 2021-01-26 MED ORDER — HYDROMORPHONE HCL 1 MG/ML IJ SOLN
0.5000 mg | Freq: Once | INTRAMUSCULAR | Status: AC
  Administered 2021-01-26: 0.5 mg via INTRAVENOUS
  Filled 2021-01-26: qty 1
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
## 2021-01-26 NOTE — Progress Notes (Signed)
Patient admitted to room, alert and oriented x4. Complains of abdominal pain but refused ibuprofen. Skin dry and warm to touch without any issue.

## 2021-01-26 NOTE — Plan of Care (Signed)
  Problem: Activity: Goal: Risk for activity intolerance will decrease Outcome: Progressing   Problem: Nutrition: Goal: Adequate nutrition will be maintained Outcome: Progressing   Problem: Pain Managment: Goal: General experience of comfort will improve Outcome: Progressing   
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
## 2021-01-26 NOTE — ED Triage Notes (Signed)
Pt comes via [facility] for R sided flank pain, seen today for the same, pain continues. Reports dysuria

## 2021-01-26 NOTE — ED Notes (Signed)
hospitalist at bedside

## 2021-01-26 NOTE — ED Notes (Signed)
Pt transported to CT ?

## 2021-01-27 DIAGNOSIS — N200 Calculus of kidney: Secondary | ICD-10-CM

## 2021-01-27 DIAGNOSIS — N1330 Unspecified hydronephrosis: Secondary | ICD-10-CM | POA: Diagnosis present

## 2021-01-27 LAB — CBC
HCT: 35.8 % — ABNORMAL LOW (ref 36.0–46.0)
Hemoglobin: 11.8 g/dL — ABNORMAL LOW (ref 12.0–15.0)
MCH: 26.3 pg (ref 26.0–34.0)
MCHC: 33 g/dL (ref 30.0–36.0)
MCV: 79.7 fL — ABNORMAL LOW (ref 80.0–100.0)
Platelets: 282 10*3/uL (ref 150–400)
RBC: 4.49 MIL/uL (ref 3.87–5.11)
RDW: 13.9 % (ref 11.5–15.5)
WBC: 8.1 10*3/uL (ref 4.0–10.5)
nRBC: 0 % (ref 0.0–0.2)

## 2021-01-27 LAB — BASIC METABOLIC PANEL
Anion gap: 12 (ref 5–15)
BUN: 17 mg/dL (ref 6–20)
CO2: 21 mmol/L — ABNORMAL LOW (ref 22–32)
Calcium: 9 mg/dL (ref 8.9–10.3)
Chloride: 105 mmol/L (ref 98–111)
Creatinine, Ser: 0.85 mg/dL (ref 0.44–1.00)
GFR, Estimated: >60 mL/min (ref >60)
Glucose, Bld: 90 mg/dL (ref 70–99)
Potassium: 3.4 mmol/L — ABNORMAL LOW (ref 3.5–5.1)
Sodium: 138 mmol/L (ref 135–145)

## 2021-01-27 MED ORDER — SENNOSIDES-DOCUSATE SODIUM 8.6-50 MG PO TABS: 1.0000 | ORAL_TABLET | Freq: Two times a day (BID) | ORAL | 0 refills | Status: DC

## 2021-01-27 MED ORDER — ONDANSETRON HCL 4 MG PO TABS: 4.0000 mg | ORAL_TABLET | Freq: Three times a day (TID) | ORAL | 0 refills | Status: DC | PRN

## 2021-01-27 MED ORDER — POLYETHYLENE GLYCOL 3350 17 G PO PACK
17.0000 g | PACK | Freq: Every day | ORAL | Status: DC
  Administered 2021-01-27: 17 g via ORAL
  Filled 2021-01-27: qty 1

## 2021-01-27 MED ORDER — OXYCODONE HCL 5 MG PO TABS: 5.0000 mg | ORAL_TABLET | Freq: Two times a day (BID) | ORAL | 0 refills | Status: AC | PRN

## 2021-01-27 NOTE — Discharge Instructions (Signed)
[name] [name],  It was a pleasure meeting you during your recent hospitalization. You were admitted for a kidney stone. You will likely pass this small stone in the next several days. We will prescribe a short course of oxycodone to only be taken for severe pain as well as a medication (Senna) to help prevent constipation from the pain medication. For mild or moderate plan, you may take NSAIDs, like Ibuprofen or Naproxen, as well as Tylenol. We have also prescribed ondansetron (Zofran) which you can take every eight hours as needed for nausea. If you have any concerns or questions, please reach out to your primary care physician. If you have significantly worsening pain, unable to keep food or liquids down, or significantly worsening nausea or vomiting please come back to the emergency department for further evaluation.  Sincerely, Dr. Jasmine December, MD    Kidney Stones Kidney stones are rock-like masses that form inside of the kidneys. Kidneys are organs that make pee (urine). A kidney stone may move into other parts of the urinary tract, including:  The tubes that connect the kidneys to the bladder (ureters).  The bladder.  The tube that carries urine out of the body (urethra). Kidney stones can cause very bad pain and can block the flow of pee. The stone usually leaves your body (passes) through your pee. You may need to have a doctor take out the stone. What are the causes? Kidney stones may be caused by:  A condition in which certain glands make too much parathyroid hormone (primary hyperparathyroidism).  A buildup of a type of crystals in the bladder made of a chemical called uric acid. The body makes uric acid when you eat certain foods.  Narrowing (stricture) of one or both of the ureters.  A kidney blockage that you were born with.  Past surgery on the kidney or the ureters, such as gastric bypass surgery. What increases the risk? You are more likely to develop this  condition if:  You have had a kidney stone in the past.  You have a family history of kidney stones.  You do not drink enough water.  You eat a diet that is high in protein, salt (sodium), or sugar.  You are overweight or very overweight (obese). What are the signs or symptoms? Symptoms of a kidney stone may include:  Pain in the side of the belly, right below the ribs (flank pain). Pain usually spreads (radiates) to the groin.  Needing to pee often or right away (urgently).  Pain when going pee (urinating).  Blood in your pee (hematuria).  Feeling like you may vomit (nauseous).  Vomiting.  Fever and chills. How is this treated? Treatment depends on the size, location, and makeup of the kidney stones. The stones will often pass out of the body through peeing. You may need to:  Drink more fluid to help pass the stone. In some cases, you may be given fluids through an IV tube put into one of your veins at the hospital.  Take medicine for pain.  Make changes in your diet to help keep kidney stones from coming back. Sometimes, medical procedures are needed to remove a kidney stone. This may involve:  A procedure to break up kidney stones using a beam of light (laser) or shock waves.  Surgery to remove the kidney stones. Follow these instructions at home: Medicines  Take over-the-counter and prescription medicines only as told by your doctor.  Ask your doctor if the medicine prescribed  to you requires you to avoid driving or using heavy machinery. Eating and drinking  Drink enough fluid to keep your pee pale yellow. You may be told to drink at least 8-10 glasses of water each day. This will help you pass the stone.  If told by your doctor, change your diet. This may include: ? Limiting how much salt you eat. ? Eating more fruits and vegetables. ? Limiting how much meat, poultry, fish, and eggs you eat.  Follow instructions from your doctor about eating or drinking  restrictions. General instructions  Collect pee samples as told by your doctor. You may need to collect a pee sample: ? 24 hours after a stone comes out. ? 8-12 weeks after a stone comes out, and every 6-12 months after that.  Strain your pee every time you pee (urinate), for as long as told. Use the strainer that your doctor recommends.  Do not throw out the stone. Keep it so that it can be tested by your doctor.  Keep all follow-up visits as told by your doctor. This is important. You may need follow-up tests. How is this prevented? To prevent another kidney stone:  Drink enough fluid to keep your pee pale yellow. This is the best way to prevent kidney stones.  Eat healthy foods.  Avoid certain foods as told by your doctor. You may be told to eat less protein.  Stay at a healthy weight.   Where to find more information  National Kidney Foundation (NKF): www.kidney.org  [facility]): www.urologyhealth.org Contact a doctor if:  You have pain that gets worse or does not get better with medicine. Get help right away if:  You have a fever or chills.  You get very bad pain.  You get new pain in your belly (abdomen).  You pass out (faint).  You cannot pee. Summary  Kidney stones are rock-like masses that form inside of the kidneys.  Kidney stones can cause very bad pain and can block the flow of pee.  The stones will often pass out of the body through peeing.  Drink enough fluid to keep your pee pale yellow. This information is not intended to replace advice given to you by your health care provider. Make sure you discuss any questions you have with your health care provider. Document Revised: 03/31/2019 Document Reviewed: 03/31/2019 Elsevier Patient Education  2021 [employer].

## 2021-01-27 NOTE — Plan of Care (Signed)

## 2021-01-27 NOTE — Progress Notes (Deleted)
Name: [name] [name] MRN: [id number] DOB: [birth year] 30 y.o. PCP: Department, [facility]  Date of Admission: 01/26/2021  1:20 AM Date of Discharge: 01/27/2021 Attending Physician: Tyson Alias, *  Discharge Diagnosis: 1.  Nephrolithiasis  Discharge Medications: Allergies as of 01/27/2021      Reactions   Iohexol Hives, Shortness Of Breath, Itching, Nausea And Vomiting, Swelling, Other (See Comments)   Also caused chest pain PER DR BARRY, PRE MED PROTOCOL SHOULD BE ADMINISTERED PRIOR TO SCANS IN THE FUTURE.  PT HAD A SIGNIFICANT REACTION.  [facility] 08/18/17 Patient had a reaction of SOB, swelling even though premedicated. Physician states no further contrast imaging in the future.   Peanut-containing Drug Products Anaphylaxis, Swelling   Facial swelling   Other Swelling   Tree nuts cause facial swelling   Sulfa Antibiotics Swelling   Facial swelling (reported by [facility] 2013)   Iodinated Diagnostic Agents Rash      Medication List    STOP taking these medications   ibuprofen 800 MG tablet Commonly known as: ADVIL   neomycin-polymyxin-hydrocortisone 3.5-10000-1 OTIC suspension Commonly known as: CORTISPORIN   omeprazole 40 MG capsule Commonly known as: PRILOSEC   promethazine 25 MG tablet Commonly known as: PHENERGAN   rizatriptan 10 MG disintegrating tablet Commonly known as: Maxalt-MLT     TAKE these medications   Aimovig 140 MG/ML Soaj Generic drug: Erenumab-aooe Inject 140 mg into the skin every 28 (twenty-eight) days.   albuterol 108 (90 Base) MCG/ACT inhaler Commonly known as: VENTOLIN HFA Inhale 2 puffs into the lungs every 6 (six) hours as needed for wheezing or shortness of breath.   DEPO-PROVERA IM Inject 1 each into the muscle every 3 (three) months. Last injection May 2019   EPINEPHrine 0.3 mg/0.3 mL Soaj injection Commonly known as: EPI-PEN Inject 0.3 mLs (0.3 mg total) into the muscle as needed for anaphylaxis.   naproxen 500  MG tablet Commonly known as: NAPROSYN Take 500 mg by mouth 2 (two) times daily as needed for pain. Take with food.   ondansetron 4 MG tablet Commonly known as: Zofran Take 1 tablet (4 mg total) by mouth every 8 (eight) hours as needed for nausea or vomiting.   oxyCODONE 5 MG immediate release tablet Commonly known as: Roxicodone Take 1 tablet (5 mg total) by mouth every 12 (twelve) hours as needed for up to 5 days for severe pain.   senna-docusate 8.6-50 MG tablet Commonly known as: Senokot-S Take 1 tablet by mouth 2 (two) times daily.       Disposition and follow-up:   [name] [name] was discharged from [facility] in Stable condition.  At the hospital follow up visit please address:  1.  Nephrolithiasis: 3 mm ureterovesical junction calculus.  Patient admitted for management of intractable pain with nausea and vomiting in setting of nephrolithiasis.  Improved overnight with pain management.  Strained urine and patient did not pass stone on admission. Patient given instructions to strain her urine and bring stones in for analysis. Sent out with Ibuprofen, zofran, and 5 day supply of oxycodone.  Constipation -Patient reported constipation on admission and was prescribed senna-docusate while taking opioid.  2.  Labs / imaging needed at time of follow-up: Stone analysis, BMP  3.  Pending labs/ test needing follow-up: None available  Follow-up Appointments:  Follow-up Information    Department, [facility]. Schedule an appointment as soon as possible for a visit in 1 week(s).   Contact information: [address]  [place] [address] [phone]               Hospital Course by problem list: 1.  Nephrolithiasis: Patient came in and was found to have 3 mm ureteral vesicular junction calculus with right-sided hydronephrosis and hydroureter on abdominal CT on 01/26/2021.  She was admitted for intractable pain with nausea and vomiting in  setting of nephrolithiasis.  She was given IV Toradol, ibuprofen, and needed Dilaudid prn to control pain.  On 01/27/2021 patient was able to tolerate p.o. intake, had improvement in her pain and n/v.   Her urine was strained and I do not expect that she has passed the stone yet.  Patient was given instructions to filter urine at home and bring her stone to her primary care physician for analysis.  Her kidney function was normal and patient's had no signs of urinary tract infection.She was sent home with pain medication and medication for nausea.   2.  Constipation: Patient came in and reports multiple days of constipation.  Opioid medications were prescribed for pain control and likely could worsen her constipation.  We prescribe senna-docusate to be taken twice daily and MiraLAX.   Discharge Exam:   BP 111/78 (BP Location: Left Arm)   Pulse 69   Temp 98.5 F (36.9 C) (Oral)   Resp 16   SpO2 98%  Discharge exam:   General: Awake; alert; conversational; appears uncomfortable Cardiovascular: Normal rate, rhythm; No murmurs, rubs or gallops Abdomen: Soft, nondistended; RUQ, RLQ, and Suprapubic Tenderness, no rebound Lungs: Normal respiratory effort, no respiratory distress  Pertinent Labs, Studies, and Procedures:  CMP Latest Ref Rng & Units 01/27/2021 01/26/2021 01/25/2021  Glucose 70 - 99 mg/dL 90 - 997(F)  BUN 6 - 20 mg/dL 17 - 20  Creatinine 4.14 - 1.00 mg/dL 2.39 5.32(Y) 2.33  Sodium 135 - 145 mmol/L 138 - 136  Potassium 3.5 - 5.1 mmol/L 3.4(L) - 4.2  Chloride 98 - 111 mmol/L 105 - 103  CO2 22 - 32 mmol/L 21(L) - 22  Calcium 8.9 - 10.3 mg/dL 9.0 - 9.4  Total Protein 6.5 - 8.1 g/dL - - 7.7  Total Bilirubin 0.3 - 1.2 mg/dL - - 0.8  Alkaline Phos 38 - 126 U/L - - 64  AST 15 - 41 U/L - - 19  ALT 0 - 44 U/L - - 16   CBC Latest Ref Rng & Units 01/27/2021 01/26/2021 01/25/2021  WBC 4.0 - 10.5 K/uL 8.1 13.0(H) 9.5  Hemoglobin 12.0 - 15.0 g/dL 11.8(L) 12.0 12.2  Hematocrit 36.0 - 46.0 % 35.8(L)  37.3 39.4  Platelets 150 - 400 K/uL 282 291 307     Discharge Instructions: Discharge Instructions    Call MD for:  persistant dizziness or light-headedness   Complete by: As directed    Call MD for:  persistant nausea and vomiting   Complete by: As directed    Call MD for:  severe uncontrolled pain   Complete by: As directed    Call MD for:  temperature >100.4   Complete by: As directed    Diet - low sodium heart healthy   Complete by: As directed    Increase activity slowly   Complete by: As directed       Signed: Albertha Ghee, MD 01/27/2021, 3:49 PM   Pager: [phone]

## 2021-01-27 NOTE — Progress Notes (Signed)
Subjective:  This is hospital day #2 for [name] [name], a 30 y.o. woman with a past medical history of gastric ulcers and an umbilical hernia who presented with right flank pain secondary to nephrolithiasis. Overnight, patient received 0.5mg  Dilaudid twice for pain, once at 8:47 pm and 6:32 am. This morning, she notes that pain is localized to the lower abdomen and groin. Reports that pain is improving and no longer feels like "hammer hitting her side". She has been able to urinate but has not used the strainer. No blood in urine. Endorses constipation and nausea with reduced appetite. Last bowel movement was a few days prior to admission. Has been able to eat a muffin this morning and drink 2 cups of water. No vomiting but notes gagging with white, frothy sputum. No fever, chills.   Objective:  Vital signs in last 24 hours: Vitals:   01/26/21 1750 01/26/21 2057 01/27/21 0159 01/27/21 0446  BP: 107/76 (!) 97/56 100/63 92/62  Pulse: 86 91 76 80  Resp: 19 14  16   Temp: 98.7 F (37.1 C) 97.7 F (36.5 C) 98.8 F (37.1 C) 98.2 F (36.8 C)  TempSrc: Oral Oral Oral Oral  SpO2: 99% 95% 97% 98%  Weight: not measured   Physical Exam:  General: Awake; alert; conversational; appears uncomfortable Cardiovascular: Normal rate, rhythm; No murmurs, rubs or gallops Abdomen: Soft, nondistended; RUQ, RLQ, and Suprapubic Tenderness, no rebound Lungs: Normal respiratory effort, no respiratory distress  Intake/Output:  No intake or output data in the 24 hours.  Labs in Last 24 Hours :   BMP     Component Value Date/Time   NA 138 01/27/2021 0638   K 3.4 (L) 01/27/2021 0638   CL 105 01/27/2021 0638   CO2 21 (L) 01/27/2021 0638   GLUCOSE 90 01/27/2021 0638   BUN 17 01/27/2021 0638   CREATININE 0.85 01/27/2021 0638   CALCIUM 9.0 01/27/2021 0638   GFRNONAA >60 01/27/2021 0638  Anion Gap- 12  CBC     Component Value Date/Time   WBC 8.1 01/27/2021 0638   RBC 4.49 01/27/2021 0638   HGB 11.8  (L) 01/27/2021 0638   HCT 35.8 (L) 01/27/2021 0638   PLT 282 01/27/2021 0638   MCV 79.7 (L) 01/27/2021 0638   MCH 26.3 01/27/2021 0638   MCHC 33.0 01/27/2021 0638   RDW 13.9 01/27/2021 0638   Imaging in Last 24 Hours:  No results.   Assessment/Plan:  In summary, [name] [name] is a 30 y.o. woman with a past medical history of gastric ulcers and an umbilical hernia who presented with right flank pain and found to have right-sided nephrolithiasis on CT with ongoing lower abdominal pain and nausea.  Active Problems:   Nephrolithiasis  #Nephrolithiasis  Patient presents with nausea, constipation, and pain localizing to lower abdomen and groin. Pain has improved since admission. No longer vomiting. Found to have a right 3 mm ureterovesical junction calculus with right-sided hydronephrosis and hydroureter on abdominal CT yesterday. Given new distribution of pain and improvement in quality, calculus is likely progressing down urinary tract to bladder. No signs of infection w/ normal WBC and no fever. Normal creatinine; no signs of AKI.   - Encouraged oral hydration to facilitate stone passage - Counseled to strain urine for renal calculus collection and lab analysis    - If tolerated, manage pain with Ibuprofen 600mg  Q6H PRN   - If inadequate pain control with Ibuprofen, give Dilaudid 0.5mg  Q6H PRN - Order BMP to monitor for  signs of AKI   #Constipation Patient notes constipation without bowel movement for multiple days. No recorded bowel movement since admission. Likely secondary to Dilaudid use and bedrest.  - Continue Senna-Docusate 1 tablet daily  - Started daily Miralax    #Migraines, chronic  Patient has history of migraines and follows closely with neurology. She is currently receiving monthly injections of Aimovig 140mg . In addition to her monthly injections, she has been taking ibuprofen 800mg  twice daily and naproxen 500mg  twice daily and continues to endorse daily migraines. -  Continue outpatient follow-up with neurology  #Asthma, chronic, stable  - Continue home albuterol 2 puffs Q6H PRN     LOS: 0 days   , Medical Student 01/27/2021, 8:09 AM

## 2021-01-27 NOTE — Progress Notes (Signed)
Provided discharge education/instructions, all questions and concerns addressed, Pt not in distress. Pt discharged home with a hat, strainer, container and belongings accompanied by her neighbor.

## 2021-01-30 NOTE — Discharge Summary (Signed)
Name: [name] [name] MRN: [id number] DOB: [birth year] 29 y.o. PCP: Department, [facility]  Date of Admission: 01/26/2021  1:20 AM Date of Discharge: 01/27/2021 Attending Physician: Tyson Alias, *  Discharge Diagnosis: 1.  Nephrolithiasis  Discharge Medications: Allergies as of 01/27/2021      Reactions   Iohexol Hives, Shortness Of Breath, Itching, Nausea And Vomiting, Swelling, Other (See Comments)   Also caused chest pain PER DR BARRY, PRE MED PROTOCOL SHOULD BE ADMINISTERED PRIOR TO SCANS IN THE FUTURE.  PT HAD A SIGNIFICANT REACTION.  [facility] 08/18/17 Patient had a reaction of SOB, swelling even though premedicated. Physician states no further contrast imaging in the future.   Peanut-containing Drug Products Anaphylaxis, Swelling   Facial swelling   Other Swelling   Tree nuts cause facial swelling   Sulfa Antibiotics Swelling   Facial swelling (reported by [facility] 2013)   Iodinated Diagnostic Agents Rash      Medication List    STOP taking these medications   ibuprofen 800 MG tablet Commonly known as: ADVIL   neomycin-polymyxin-hydrocortisone 3.5-10000-1 OTIC suspension Commonly known as: CORTISPORIN   omeprazole 40 MG capsule Commonly known as: PRILOSEC   promethazine 25 MG tablet Commonly known as: PHENERGAN   rizatriptan 10 MG disintegrating tablet Commonly known as: Maxalt-MLT     TAKE these medications   Aimovig 140 MG/ML Soaj Generic drug: Erenumab-aooe Inject 140 mg into the skin every 28 (twenty-eight) days.   albuterol 108 (90 Base) MCG/ACT inhaler Commonly known as: VENTOLIN HFA Inhale 2 puffs into the lungs every 6 (six) hours as needed for wheezing or shortness of breath.   DEPO-PROVERA IM Inject 1 each into the muscle every 3 (three) months. Last injection May 2019   EPINEPHrine 0.3 mg/0.3 mL Soaj injection Commonly known as: EPI-PEN Inject 0.3 mLs (0.3 mg total) into the muscle as needed for anaphylaxis.   naproxen 500  MG tablet Commonly known as: NAPROSYN Take 500 mg by mouth 2 (two) times daily as needed for pain. Take with food.   ondansetron 4 MG tablet Commonly known as: Zofran Take 1 tablet (4 mg total) by mouth every 8 (eight) hours as needed for nausea or vomiting.   oxyCODONE 5 MG immediate release tablet Commonly known as: Roxicodone Take 1 tablet (5 mg total) by mouth every 12 (twelve) hours as needed for up to 5 days for severe pain.   senna-docusate 8.6-50 MG tablet Commonly known as: Senokot-S Take 1 tablet by mouth 2 (two) times daily.       Disposition and follow-up:   [name] was discharged from [facility] in Stable condition.  At the hospital follow up visit please address:  1.  Nephrolithiasis: 3 mm ureterovesical junction calculus.  Patient admitted for management of intractable pain with nausea and vomiting in setting of nephrolithiasis.  Improved overnight with pain management.  Strained urine and patient did not pass stone on admission. Patient given instructions to strain her urine and bring stones in for analysis. Sent out with Ibuprofen, zofran, and 5 day supply of oxycodone.  Constipation -Patient reported constipation on admission and was prescribed senna-docusate while taking opioid.  2.  Labs / imaging needed at time of follow-up: Stone analysis, BMP  3.  Pending labs/ test needing follow-up: None available  Follow-up Appointments:  Follow-up Information    Department, [facility]. Schedule an appointment as soon as possible for a visit in 1 week(s).   Contact information: [address] [place] [address] [phone]               Hospital Course by problem list: 1.  Nephrolithiasis: Patient came in and was found to have 3 mm ureteral vesicular junction calculus with right-sided hydronephrosis and hydroureter on abdominal CT on 01/26/2021.  She was admitted for intractable pain with nausea and vomiting in  setting of nephrolithiasis.  She was given IV Toradol, ibuprofen, and needed Dilaudid prn to control pain.  On 01/27/2021 patient was able to tolerate p.o. intake, had improvement in her pain and n/v.   Her urine was strained and I do not expect that she has passed the stone yet.  Patient was given instructions to filter urine at home and bring her stone to her primary care physician for analysis.  Her kidney function was normal and patient's had no signs of urinary tract infection.She was sent home with pain medication and medication for nausea.   2.  Constipation: Patient came in and reports multiple days of constipation.  Opioid medications were prescribed for pain control and likely could worsen her constipation.  We prescribe senna-docusate to be taken twice daily and MiraLAX.   Discharge Exam:   BP 111/78 (BP Location: Left Arm)   Pulse 69   Temp 98.5 F (36.9 C) (Oral)   Resp 16   SpO2 98%  Discharge exam:   General: Awake; alert; conversational; appears uncomfortable Cardiovascular: Normal rate, rhythm; No murmurs, rubs or gallops Abdomen: Soft, nondistended; RUQ, RLQ, and Suprapubic Tenderness, no rebound Lungs: Normal respiratory effort, no respiratory distress  Pertinent Labs, Studies, and Procedures:  CMP Latest Ref Rng & Units 01/27/2021 01/26/2021 01/25/2021  Glucose 70 - 99 mg/dL 90 - 120(H)  BUN 6 - 20 mg/dL 17 - 20  Creatinine 0.44 - 1.00 mg/dL 0.85 1.15(H) 0.95  Sodium 135 - 145 mmol/L 138 - 136  Potassium 3.5 - 5.1 mmol/L 3.4(L) - 4.2  Chloride 98 - 111 mmol/L 105 - 103  CO2 22 - 32 mmol/L 21(L) - 22  Calcium 8.9 - 10.3 mg/dL 9.0 - 9.4  Total Protein 6.5 - 8.1 g/dL - - 7.7  Total Bilirubin 0.3 - 1.2 mg/dL - - 0.8  Alkaline Phos 38 - 126 U/L - - 64  AST 15 - 41 U/L - - 19  ALT 0 - 44 U/L - - 16   CBC Latest Ref Rng & Units 01/27/2021 01/26/2021 01/25/2021  WBC 4.0 - 10.5 K/uL 8.1 13.0(H) 9.5  Hemoglobin 12.0 - 15.0 g/dL 11.8(L) 12.0 12.2  Hematocrit 36.0 - 46.0 % 35.8(L)  37.3 39.4  Platelets 150 - 400 K/uL 282 291 307     Discharge Instructions: Discharge Instructions    Call MD for:  persistant dizziness or light-headedness   Complete by: As directed    Call MD for:  persistant nausea and vomiting   Complete by: As directed    Call MD for:  severe uncontrolled pain   Complete by: As directed    Call MD for:  temperature >100.4   Complete by: As directed    Diet - low sodium heart healthy   Complete by: As directed    Increase activity slowly   Complete by: As directed       Signed: Lilyanne Mcquown, MD 01/27/2021, 3:49 PM   Pager: [phone]  
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
## 2021-04-10 ENCOUNTER — Ambulatory Visit: Payer: Medicaid Other | Admitting: Neurology

## 2021-07-28 ENCOUNTER — Emergency Department (HOSPITAL_BASED_OUTPATIENT_CLINIC_OR_DEPARTMENT_OTHER)
Admission: EM | Admit: 2021-07-28 | Discharge: 2021-07-28 | Disposition: A | Discharge status: Home / Self Care | Payer: Medicaid Other | Attending: Emergency Medicine | Admitting: Emergency Medicine

## 2021-07-28 ENCOUNTER — Encounter (HOSPITAL_BASED_OUTPATIENT_CLINIC_OR_DEPARTMENT_OTHER): Payer: Self-pay

## 2021-07-28 ENCOUNTER — Other Ambulatory Visit: Payer: Self-pay

## 2021-07-28 DIAGNOSIS — R112 Nausea with vomiting, unspecified: Secondary | ICD-10-CM | POA: Insufficient documentation

## 2021-07-28 DIAGNOSIS — Z87891 Personal history of nicotine dependence: Secondary | ICD-10-CM | POA: Diagnosis not present

## 2021-07-28 DIAGNOSIS — G4489 Other headache syndrome: Secondary | ICD-10-CM

## 2021-07-28 DIAGNOSIS — J45909 Unspecified asthma, uncomplicated: Secondary | ICD-10-CM | POA: Insufficient documentation

## 2021-07-28 DIAGNOSIS — R109 Unspecified abdominal pain: Secondary | ICD-10-CM | POA: Diagnosis not present

## 2021-07-28 DIAGNOSIS — R300 Dysuria: Secondary | ICD-10-CM | POA: Insufficient documentation

## 2021-07-28 DIAGNOSIS — R519 Headache, unspecified: Secondary | ICD-10-CM | POA: Diagnosis not present

## 2021-07-28 DIAGNOSIS — Z91010 Allergy to peanuts: Secondary | ICD-10-CM | POA: Diagnosis not present

## 2021-07-28 LAB — BASIC METABOLIC PANEL
Anion gap: 10 (ref 5–15)
BUN: 16 mg/dL (ref 6–20)
CO2: 22 mmol/L (ref 22–32)
Calcium: 9.4 mg/dL (ref 8.9–10.3)
Chloride: 102 mmol/L (ref 98–111)
Creatinine, Ser: 0.87 mg/dL (ref 0.44–1.00)
GFR, Estimated: >60 mL/min (ref >60)
Glucose, Bld: 93 mg/dL (ref 70–99)
Potassium: 3.7 mmol/L (ref 3.5–5.1)
Sodium: 134 mmol/L — ABNORMAL LOW (ref 135–145)

## 2021-07-28 LAB — URINALYSIS, ROUTINE W REFLEX MICROSCOPIC
Bilirubin Urine: NEGATIVE
Glucose, UA: NEGATIVE mg/dL
Hgb urine dipstick: NEGATIVE
Ketones, ur: NEGATIVE mg/dL
Nitrite: NEGATIVE
Specific Gravity, Urine: 1.02 (ref 1.005–1.030)
pH: 6.5 (ref 5.0–8.0)

## 2021-07-28 LAB — CBC WITH DIFFERENTIAL/PLATELET
Abs Immature Granulocytes: 0.01 10*3/uL (ref 0.00–0.07)
Basophils Absolute: 0.1 10*3/uL (ref 0.0–0.1)
Basophils Relative: 1 %
Eosinophils Absolute: 0.1 10*3/uL (ref 0.0–0.5)
Eosinophils Relative: 1 %
HCT: 41.8 % (ref 36.0–46.0)
Hemoglobin: 13.7 g/dL (ref 12.0–15.0)
Immature Granulocytes: 0 %
Lymphocytes Relative: 42 %
Lymphs Abs: 1.9 10*3/uL (ref 0.7–4.0)
MCH: 26 pg (ref 26.0–34.0)
MCHC: 32.8 g/dL (ref 30.0–36.0)
MCV: 79.5 fL — ABNORMAL LOW (ref 80.0–100.0)
Monocytes Absolute: 0.4 10*3/uL (ref 0.1–1.0)
Monocytes Relative: 10 %
Neutro Abs: 2.1 10*3/uL (ref 1.7–7.7)
Neutrophils Relative %: 46 %
Platelet Morphology: NORMAL
Platelets: 234 10*3/uL (ref 150–400)
RBC: 5.26 MIL/uL — ABNORMAL HIGH (ref 3.87–5.11)
RDW: 14.4 % (ref 11.5–15.5)
WBC: 4.5 10*3/uL (ref 4.0–10.5)
nRBC: 0 % (ref 0.0–0.2)

## 2021-07-28 LAB — PREGNANCY, URINE: Preg Test, Ur: NEGATIVE

## 2021-07-28 MED ORDER — SODIUM CHLORIDE 0.9 % IV BOLUS
1000.0000 mL | Freq: Once | INTRAVENOUS | Status: AC
  Administered 2021-07-28: 1000 mL via INTRAVENOUS

## 2021-07-28 MED ORDER — KETOROLAC TROMETHAMINE 30 MG/ML IJ SOLN
30.0000 mg | Freq: Once | INTRAMUSCULAR | Status: AC
  Administered 2021-07-28: 30 mg via INTRAVENOUS
  Filled 2021-07-28: qty 1

## 2021-07-28 MED ORDER — PROCHLORPERAZINE EDISYLATE 10 MG/2ML IJ SOLN
10.0000 mg | Freq: Once | INTRAMUSCULAR | Status: AC
  Administered 2021-07-28: 10 mg via INTRAVENOUS
  Filled 2021-07-28: qty 2
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
## 2021-07-28 NOTE — Discharge Instructions (Addendum)

## 2021-07-28 NOTE — ED Triage Notes (Signed)
Patient here POV from Home with a Migraine and Left Flank Pain  Pain has been present for approximately 1 week PTA. Patient has Hx of Migraines but this has been worst. Patient also complaining of Left Flank Pain along with Dysuria.   Ambulatory. GCS 15. Patient avoiding Light due to Pain. Excedrin/Tylenol have been largely ineffective. No Fevers at Home. No Obvious Neurological Deficits.

## 2021-09-03 ENCOUNTER — Emergency Department (HOSPITAL_COMMUNITY): Payer: Medicaid Other

## 2021-09-03 ENCOUNTER — Encounter (HOSPITAL_COMMUNITY): Payer: Self-pay

## 2021-09-03 ENCOUNTER — Emergency Department (HOSPITAL_COMMUNITY)
Admission: EM | Admit: 2021-09-03 | Discharge: 2021-09-03 | Disposition: A | Discharge status: Home / Self Care | Payer: Medicaid Other | Attending: Emergency Medicine | Admitting: Emergency Medicine

## 2021-09-03 DIAGNOSIS — X501XXA Overexertion from prolonged static or awkward postures, initial encounter: Secondary | ICD-10-CM | POA: Diagnosis not present

## 2021-09-03 DIAGNOSIS — W19XXXA Unspecified fall, initial encounter: Secondary | ICD-10-CM | POA: Diagnosis not present

## 2021-09-03 DIAGNOSIS — M25562 Pain in left knee: Secondary | ICD-10-CM | POA: Diagnosis not present

## 2021-09-03 DIAGNOSIS — Y9301 Activity, walking, marching and hiking: Secondary | ICD-10-CM | POA: Diagnosis not present

## 2021-09-03 DIAGNOSIS — Z87891 Personal history of nicotine dependence: Secondary | ICD-10-CM | POA: Insufficient documentation

## 2021-09-03 DIAGNOSIS — Z91010 Allergy to peanuts: Secondary | ICD-10-CM | POA: Insufficient documentation

## 2021-09-03 DIAGNOSIS — J45909 Unspecified asthma, uncomplicated: Secondary | ICD-10-CM | POA: Insufficient documentation

## 2021-09-03 MED ORDER — HYDROCODONE-ACETAMINOPHEN 5-325 MG PO TABS: 2.0000 | ORAL_TABLET | ORAL | 0 refills | Status: DC | PRN

## 2021-09-03 MED ORDER — KETOROLAC TROMETHAMINE 30 MG/ML IJ SOLN
30.0000 mg | Freq: Once | INTRAMUSCULAR | Status: AC
  Administered 2021-09-03: 30 mg via INTRAMUSCULAR
  Filled 2021-09-03: qty 1

## 2021-09-03 NOTE — ED Provider Notes (Signed)
Emergency Medicine Provider Triage Evaluation Note  [name] [name] , a 30 y.o. female  was evaluated in triage.  Pt complains of left knee pain after an injury last night.  States that she was walking and twisted her left knee and fell onto it.  There is no other trauma.  Since then she has had progressive pain in her left knee that shoots up to her left thigh.  She has tried naproxen at home without relief.  Review of Systems  Positive: Left knee pain Negative: Back pain, head trauma, LOC  Physical Exam  There were no vitals taken for this visit. Gen:   Awake, no distress   Resp:  Normal effort  MSK:   Moves extremities without difficulty  Other:  Pulses normal and equal in bilateral lower extremities, decreased sensation on left side compared to right, no focal tenderness  Medical Decision Making  Medically screening exam initiated at 12:24 PM.  Appropriate orders placed.  [name] [name] was informed that the remainder of the evaluation will be completed by another provider, this initial triage assessment does not replace that evaluation, and the importance of remaining in the ED until their evaluation is complete.     Jeanella Flattery 09/03/21 1224    Pollyann Savoy, MD 09/03/21 1242

## 2021-09-03 NOTE — Discharge Instructions (Addendum)
You were seen in the ED today for left knee pain after an injury. Your XR did not show a fracture, however I am concerned that you have a ligament or tendon injury. Please call the orthopedic provider that is provided for you in your discharge paperwork tomorrow morning.
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
## 2021-09-03 NOTE — Progress Notes (Signed)
Orthopedic Tech Progress Note Patient Details:  [name] [name] [birth year] [id number]  Ortho Devices Type of Ortho Device: Knee Immobilizer Ortho Device/Splint Location: left Ortho Device/Splint Interventions: Application   Post Interventions Patient Tolerated: Well Instructions Provided: Care of device  Saul Fordyce 09/03/2021, 2:38 PM

## 2021-09-03 NOTE — ED Triage Notes (Signed)
Pt presents with c/o left knee pain that shoots up to her left thigh. Pt reports pain started last night when her knee rolled walking through the kitchen.

## 2021-09-05 ENCOUNTER — Ambulatory Visit: Payer: Self-pay

## 2021-09-05 ENCOUNTER — Telehealth: Payer: Self-pay | Admitting: *Deleted

## 2021-09-05 ENCOUNTER — Telehealth (HOSPITAL_BASED_OUTPATIENT_CLINIC_OR_DEPARTMENT_OTHER): Payer: Self-pay | Admitting: Emergency Medicine

## 2021-09-05 MED ORDER — HYDROCODONE-ACETAMINOPHEN 5-325 MG PO TABS: 2.0000 | ORAL_TABLET | ORAL | 0 refills | Status: AC | PRN

## 2021-09-05 NOTE — Telephone Encounter (Signed)
Pt seen in ED for left knee pain was given paper prescription for narcotic. [facility] on [address] refused to fill. Pt stated she did not know what to do- Per note pt was to f[facility] at [facility].  Called [facility] and  warm transferred pt.        Answer Assessment - Initial Assessment Questions 1. REASON FOR CALL or QUESTION: "What is your reason for calling today?" or "How can I best help you?" or "What question do you have that I can help answer?"   Has left knee pain- pharmacy will not fill pain med because it was written on paper. Pt calling to find out what to do .  Protocols used: Information Only Call - No Triage-A-AH

## 2021-09-05 NOTE — Telephone Encounter (Signed)
Pt called regarding being given a paper Rx for narcotic and not being able to fill.  RNCM contacted prescribing EDP to send Rx to pt pharmacy ([facility] on [address]).

## 2021-09-05 NOTE — Telephone Encounter (Signed)
I was called by patient with pharmacy concern with medication being filled. I have changed the medication to a electronic prescription to the appropriate pharmacy.

## 2021-09-06 DIAGNOSIS — M25562 Pain in left knee: Secondary | ICD-10-CM | POA: Insufficient documentation

## 2021-09-06 DIAGNOSIS — M25561 Pain in right knee: Secondary | ICD-10-CM | POA: Insufficient documentation
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
## 2021-09-07 ENCOUNTER — Other Ambulatory Visit: Payer: Self-pay

## 2021-09-07 ENCOUNTER — Ambulatory Visit: Payer: Medicaid Other | Admitting: Neurology

## 2021-09-07 ENCOUNTER — Encounter: Payer: Self-pay | Admitting: Neurology

## 2021-09-07 VITALS — BP 118/74 | HR 73 | Ht 62.0 in | Wt 184.0 lb

## 2021-09-07 DIAGNOSIS — M7918 Myalgia, other site: Secondary | ICD-10-CM | POA: Diagnosis not present

## 2021-09-07 DIAGNOSIS — G43719 Chronic migraine without aura, intractable, without status migrainosus: Secondary | ICD-10-CM

## 2021-09-07 MED ORDER — EMGALITY 120 MG/ML ~~LOC~~ SOAJ: 120.0000 mg | SUBCUTANEOUS | 5 refills | Status: DC

## 2021-09-07 MED ORDER — TIZANIDINE HCL 4 MG PO TABS: 4.0000 mg | ORAL_TABLET | Freq: Every evening | ORAL | 5 refills | Status: DC | PRN

## 2021-09-07 MED ORDER — EMGALITY 120 MG/ML ~~LOC~~ SOAJ: 240.0000 mg | Freq: Once | SUBCUTANEOUS | 0 refills | Status: AC

## 2021-09-07 NOTE — Patient Instructions (Addendum)
  STOP AIMOVIG.  Start EMGALITY.  First dose requires 2 injections but then just 1 injection every 28 days thereafter.   Take Nurtec at earliest onset of headache.  Maximum 1 tablet in 24 hours.  Take with promethazine with nausea.  Contact me if effective or not. STOP TREATING MIGRAINES WITH THE OVER THE COUNTER PAIN RELIEVERS (you may treat knee pain if needed).  Limit use of pain relievers to no more than 2 days out of the week.  These medications include acetaminophen, NSAIDs (ibuprofen/Advil/Motrin, naproxen/Aleve, triptans (Imitrex/sumatriptan), Excedrin, and narcotics.  This will help reduce risk of rebound headaches. For neck pain, take tizanidine 4mg  at bedtime if needed - caution for drowsiness. Be aware of common food triggers:  - Caffeine:  coffee, black tea, cola, Mt. Dew  - Chocolate  - Dairy:  aged cheeses (brie, blue, cheddar, gouda, [place], provolone, [place], Swiss, etc), chocolate milk, buttermilk, sour cream, limit eggs and yogurt  - Nuts, peanut butter  - Alcohol  - Cereals/grains:  FRESH breads (fresh bagels, sourdough, doughnuts), yeast productions  - Processed/canned/aged/cured meats (pre-packaged deli meats, hotdogs)  - MSG/glutamate:  soy sauce, flavor enhancer, pickled/preserved/marinated foods  - Sweeteners:  aspartame (Equal, Nutrasweet).  Sugar and Splenda are okay  - Vegetables:  legumes (lima beans, lentils, snow peas, fava beans, pinto peans, peas, garbanzo beans), sauerkraut, onions, olives, pickles  - Fruit:  avocados, bananas, citrus fruit (orange, lemon, grapefruit), mango  - Other:  Frozen meals, macaroni and cheese Routine exercise Stay adequately hydrated (aim for 64 oz water daily) Keep headache diary Maintain proper stress management Maintain proper sleep hygiene Do not skip meals Consider supplements:  magnesium citrate 400mg  daily, riboflavin 400mg  daily, coenzyme Q10 100mg  three times daily.

## 2021-09-07 NOTE — Progress Notes (Signed)
Medication Samples have been provided to the patient.  Drug name: Nurtec       Strength: 75 mg        Qty: 2  LOT: [id number]  Exp.Date: 12/2023  Dosing instructions: AS needed  The patient has been instructed regarding the correct time, dose, and frequency of taking this medication, including desired effects and most common side effects.   Leida Lauth 8:33 AM 09/07/2021

## 2021-09-21 DIAGNOSIS — M25561 Pain in right knee: Secondary | ICD-10-CM | POA: Insufficient documentation

## 2021-09-26 ENCOUNTER — Telehealth: Payer: Self-pay

## 2021-09-26 NOTE — Telephone Encounter (Signed)
New message   Fax sent to the plan Your PA has been faxed to the plan as a paper copy. Please contact the plan directly if you haven't received a determination in a typical timeframe.  You will be notified of the determination via fax.  Neta Bradly Bienenstock (Key: [id number])Need help? Call us at [phone] Status Sent to [employer] The plan will fax you a determination, typically within 1 to 5 business days.  How do I follow up? Drug Emgality 120MG /ML auto-injectors (migraine) Form [employer] [facility] Migraine Calcitonin Agents Preventative Prior Authorization Form Migraine Calcitonin Agents Preventative Prior Authorization Form for [employer] [facility] members [phone]

## 2021-10-05 ENCOUNTER — Telehealth: Payer: Self-pay

## 2021-10-05 NOTE — Telephone Encounter (Signed)
New message   Fax sent to the plan Your PA has been faxed to the plan as a paper copy. Please contact the plan directly if you haven't received a determination in a typical timeframe.  You will be notified of the determination via fax.   [name] [name] (Key: [id number])Need help? Call us at [phone] Status Sent to [employer] The plan will fax you a determination, typically within 1 to 5 business days.  How do I follow up? Drug Emgality 120MG /ML auto-injectors (migraine) Form [employer] [facility] Migraine Calcitonin Agents Preventative Prior Authorization Form Migraine Calcitonin Agents Preventative Prior Authorization Form for [employer] [facility] members [phone]

## 2021-10-05 NOTE — Telephone Encounter (Signed)
F/u   Receive fax from [facility] [place] decision on your request for services -- we denied your request.   Medication Emaglity  120 mg   The last day to ask for an appeal is 11/25/2021.

## 2021-10-12 DIAGNOSIS — S83419A Sprain of medial collateral ligament of unspecified knee, initial encounter: Secondary | ICD-10-CM

## 2021-10-12 HISTORY — DX: Sprain of medial collateral ligament of unspecified knee, initial encounter: S83.419A

## 2021-10-25 NOTE — Telephone Encounter (Signed)
F/U -covermymeds    Resend prior authorization   Fax sent to the plan Your PA has been faxed to the plan as a paper copy. Please contact the plan directly if you haven't received a determination in a typical timeframe.  You will be notified of the determination electronically and via fax.  Remigio Eisenmenger (Key: [id number]) Rx #: [id number] Emgality 120MG /ML auto-injectors (migraine)   Form [employer] [facility] Migraine Calcitonin Agents Preventative Prior Authorization Form  Plan Contact [phone] phone [phone] fax Created 15 days ago Sent to Plan 1 minute ago Determination Wait for Determination Please wait for the payer to return a determination.

## 2021-11-02 NOTE — Telephone Encounter (Signed)
F/u   Received fax from  [employer] [facility]   Drug Emgality 120 mg   Quantity  3  Durtation  90   Approved for  3 months 11.30.2022 to  3.1.2023 .

## 2021-11-14 DIAGNOSIS — M25369 Other instability, unspecified knee: Secondary | ICD-10-CM | POA: Insufficient documentation

## 2021-11-14 HISTORY — DX: Other instability, unspecified knee: M25.369

## 2021-12-13 ENCOUNTER — Other Ambulatory Visit: Payer: Self-pay | Admitting: Neurology

## 2021-12-19 DIAGNOSIS — M25362 Other instability, left knee: Secondary | ICD-10-CM | POA: Insufficient documentation

## 2021-12-19 HISTORY — DX: Other instability, left knee: M25.362

## 2022-02-13 DIAGNOSIS — Z4789 Encounter for other orthopedic aftercare: Secondary | ICD-10-CM | POA: Insufficient documentation

## 2022-02-21 ENCOUNTER — Other Ambulatory Visit (HOSPITAL_COMMUNITY): Payer: Self-pay

## 2022-02-22 ENCOUNTER — Telehealth: Payer: Self-pay

## 2022-02-22 ENCOUNTER — Other Ambulatory Visit (HOSPITAL_COMMUNITY): Payer: Self-pay

## 2022-02-22 NOTE — Telephone Encounter (Signed)
Patient Advocate Encounter ?  ?Received notification from [facility] that prior authorization for Emgality 120mg /ml auto-injectors is required by his/her Insurance claims handler. ?  ?PA submitted on 02/22/22 ? ?Faxed PA form & chart notes to: [phone] ? ?Status is pending ?   ?[facility] will continue to follow: ? ?Patient Advocate ?Fax: [phone]  ?

## 2022-02-22 NOTE — Patient Instructions (Signed)
2 VISITOR  (aged 30 and older)  ARE ALLOWED TO COME WITH YOU AND STAY IN THE WAITING ROOM ONLY DURING PRE OP AND PROCEDURE.   ?**NO VISITORS ARE ALLOWED IN THE SHORT STAY AREA OR RECOVERY ROOM!!** ? ?IF YOU WILL BE ADMITTED INTO THE HOSPITAL YOU ARE ALLOWED ONLY TWO SUPPORT PEOPLE DURING VISITATION HOURS ONLY (7 AM -8PM)   ?The support person(s) must pass our screening, gel in and out, and wear a mask at all times, including in the patient?s room. ?Patients must also wear a mask when staff or their support person are in the room. ?Visitors GUEST BADGE MUST BE WORN VISIBLY  ?One adult visitor may remain with you overnight and MUST be in the room by 8 P.M. ?  ? ? Your procedure is scheduled on: 03/09/22 ? ? Report to [facility] Main Entrance ? ?  Report to admitting at  8:15 AM ? ? Call this number if you have problems the morning of surgery [phone] ? ? Do not eat food :After Midnight. ? ? After Midnight you may have the following liquids until _7:30_____ AM DAY OF SURGERY ? ?Water ?Black Coffee (sugar ok, NO MILK/CREAM OR CREAMERS)  ?Tea (sugar ok, NO MILK/CREAM OR CREAMERS) regular and decaf                             ?Plain Jell-O (NO RED)                                           ?Fruit ices (not with fruit pulp, NO RED)                                     ?Popsicles (NO RED)                                                                  ?Juice: apple, WHITE grape, WHITE cranberry ?Sports drinks like Gatorade (NO RED) ?Clear broth(vegetable,chicken,beef) ? ?             ? ?  ?  ?The day of surgery:  ?Drink ONE (1) Pre-Surgery Clear Ensure at   7:15 AM the morning of surgery. Drink in one sitting. Do not sip.  ?This drink was given to you during your hospital  ?pre-op appointment visit. ?Nothing else to drink after completing the  ?Pre-Surgery Clear Ensure at 7:30 AM ?  ?       If you have questions, please contact your surgeon?s office. ? ? ?  ?  ?Oral Hygiene is also important to reduce your  risk of infection.                                    ?Remember - BRUSH YOUR TEETH THE MORNING OF SURGERY WITH YOUR REGULAR TOOTHPASTE ? ? Do NOT smoke after Midnight ? ? Take these medicines the morning of surgery with A SIP OF WATER: use your inhaler and  bring it withyou ? ? ?                  ?           You may not have any metal on your body including hair pins, jewelry, and body piercing ? ?           Do not wear make-up, lotions, powders, perfumes/cologne, or deodorant ? ?Do not wear nail polish including gel and S&S, artificial/acrylic nails, or any other type of covering on natural nails including finger and toenails. If you have artificial nails, gel coating, etc. that needs to be removed by a nail salon please have this removed prior to surgery or surgery may need to be canceled/ delayed if the surgeon/ anesthesia feels like they are unable to be safely monitored.  ? ?Do not shave  48 hours prior to surgery.  ? ? ? Do not bring valuables to the hospital. [facility] NOT ?            RESPONSIBLE   FOR VALUABLES. ? ? Contacts, dentures or bridgework may not be worn into surgery. ? ?  ? Patients discharged on the day of surgery will not be allowed to drive home.  Someone NEEDS to stay with you for the first 24 hours after anesthesia. ? ? Special Instructions: Bring a copy of your healthcare power of attorney and living will documents  the day of surgery if you haven't scanned them before. ? ?            Please read over the following fact sheets you were given: IF [place] [phone] ? ?   [place] - Preparing for Surgery ?Before surgery, you can play an important role.  Because skin is not sterile, your skin needs to be as free of germs as possible.  You can reduce the number of germs on your skin by washing with CHG (chlorahexidine gluconate) soap before surgery.  CHG is an antiseptic cleaner which kills germs and bonds with the skin to continue  killing germs even after washing. ?Please DO NOT use if you have an allergy to CHG or antibacterial soaps.  If your skin becomes reddened/irritated stop using the CHG and inform your nurse when you arrive at Short Stay. ?Do not shave (including legs and underarms) for at least 48 hours prior to the first CHG shower.   ?Please follow these instructions carefully: ? 1.  Shower with CHG Soap the night before surgery and the  morning of Surgery. ? 2.  If you choose to wash your hair, wash your hair first as usual with your  normal  shampoo. ? 3.  After you shampoo, rinse your hair and body thoroughly to remove the  shampoo.                    ?        4.  Use CHG as you would any other liquid soap.  You can apply chg directly  to the skin and wash  ?                     Gently with a scrungie or clean washcloth. ? 5.  Apply the CHG Soap to your body ONLY FROM THE NECK DOWN.   Do not use on face/ open      ?  Wound or open sores. Avoid contact with eyes, ears mouth and genitals (private parts).  ?                     Production manager,  Genitals (private parts) with your normal soap. ?            6.  Wash thoroughly, paying special attention to the area where your surgery  will be performed. ? 7.  Thoroughly rinse your body with warm water from the neck down. ? 8.  DO NOT shower/wash with your normal soap after using and rinsing off  the CHG Soap. ?               9.  Pat yourself dry with a clean towel. ?           10.  Wear clean pajamas. ?           11.  Place clean sheets on your bed the night of your first shower and do not  sleep with pets. ?Day of Surgery : ?Do not apply any lotions/deodorants the morning of surgery.  Please wear clean clothes to the hospital/surgery center. ? ?FAILURE TO FOLLOW THESE INSTRUCTIONS MAY RESULT IN THE CANCELLATION OF YOUR SURGERY ? ? ? ?________________________________________________________________________  ? ?Incentive Spirometer ? ?An incentive spirometer is a tool that  can help keep your lungs clear and active. This tool measures how well you are filling your lungs with each breath. Taking long deep breaths may help reverse or decrease the chance of developing breathing (pulmonary) problems (especially infection) following: ?A long period of time when you are unable to move or be active. ?BEFORE THE PROCEDURE  ?If the spirometer includes an indicator to show your best effort, your nurse or respiratory therapist will set it to a desired goal. ?If possible, sit up straight or lean slightly forward. Try not to slouch. ?Hold the incentive spirometer in an upright position. ?INSTRUCTIONS FOR USE  ?Sit on the edge of your bed if possible, or sit up as far as you can in bed or on a chair. ?Hold the incentive spirometer in an upright position. ?Breathe out normally. ?Place the mouthpiece in your mouth and seal your lips tightly around it. ?Breathe in slowly and as deeply as possible, raising the piston or the ball toward the top of the column. ?Hold your breath for 3-5 seconds or for as long as possible. Allow the piston or ball to fall to the bottom of the column. ?Remove the mouthpiece from your mouth and breathe out normally. ?Rest for a few seconds and repeat Steps 1 through 7 at least 10 times every 1-2 hours when you are awake. Take your time and take a few normal breaths between deep breaths. ?The spirometer may include an indicator to show your best effort. Use the indicator as a goal to work toward during each repetition. ?After each set of 10 deep breaths, practice coughing to be sure your lungs are clear. If you have an incision (the cut made at the time of surgery), support your incision when coughing by placing a pillow or rolled up towels firmly against it. ?Once you are able to get out of bed, walk around indoors and cough well. You may stop using the incentive spirometer when instructed by your caregiver.  ?RISKS AND COMPLICATIONS ?Take your time so you do not get dizzy or  light-headed. ?If you are in pain, you may need to take or ask for pain  medication before doing incentive spirometry. It is harder to take a deep breath if you are having pain. ?AFTER USE ?Rest and bre

## 2022-02-26 ENCOUNTER — Other Ambulatory Visit: Payer: Self-pay

## 2022-02-26 ENCOUNTER — Encounter (HOSPITAL_COMMUNITY)
Admission: RE | Admit: 2022-02-26 | Discharge: 2022-02-26 | Disposition: A | Discharge status: Home / Self Care | Payer: Medicaid Other | Source: Ambulatory Visit | Attending: Orthopedic Surgery | Admitting: Orthopedic Surgery

## 2022-02-26 ENCOUNTER — Encounter (HOSPITAL_COMMUNITY): Payer: Self-pay

## 2022-02-26 VITALS — BP 134/91 | HR 73 | Temp 98.7°F | Resp 18 | Ht 62.0 in | Wt 182.0 lb

## 2022-02-26 DIAGNOSIS — Z01818 Encounter for other preprocedural examination: Secondary | ICD-10-CM

## 2022-02-26 DIAGNOSIS — Z01812 Encounter for preprocedural laboratory examination: Secondary | ICD-10-CM | POA: Diagnosis present

## 2022-02-26 LAB — CBC
HCT: 40.9 % (ref 36.0–46.0)
Hemoglobin: 13.3 g/dL (ref 12.0–15.0)
MCH: 27.4 pg (ref 26.0–34.0)
MCHC: 32.5 g/dL (ref 30.0–36.0)
MCV: 84.3 fL (ref 80.0–100.0)
Platelets: 310 10*3/uL (ref 150–400)
RBC: 4.85 MIL/uL (ref 3.87–5.11)
RDW: 13.5 % (ref 11.5–15.5)
WBC: 7.1 10*3/uL (ref 4.0–10.5)
nRBC: 0 % (ref 0.0–0.2)
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
## 2022-03-09 ENCOUNTER — Ambulatory Visit (HOSPITAL_COMMUNITY)
Admission: RE | Admit: 2022-03-09 | Discharge: 2022-03-09 | Disposition: A | Discharge status: Home / Self Care | Payer: Medicaid Other | Attending: Orthopedic Surgery | Admitting: Orthopedic Surgery

## 2022-03-09 ENCOUNTER — Encounter (HOSPITAL_COMMUNITY): Payer: Self-pay | Admitting: Orthopedic Surgery

## 2022-03-09 ENCOUNTER — Ambulatory Visit (HOSPITAL_COMMUNITY): Payer: Medicaid Other

## 2022-03-09 ENCOUNTER — Other Ambulatory Visit: Payer: Self-pay

## 2022-03-09 ENCOUNTER — Ambulatory Visit (HOSPITAL_COMMUNITY): Payer: Medicaid Other | Admitting: Certified Registered"

## 2022-03-09 ENCOUNTER — Encounter (HOSPITAL_COMMUNITY)
Admission: RE | Disposition: A | Discharge status: Home / Self Care | Payer: Self-pay | Source: Home / Self Care | Attending: Orthopedic Surgery

## 2022-03-09 ENCOUNTER — Ambulatory Visit (HOSPITAL_BASED_OUTPATIENT_CLINIC_OR_DEPARTMENT_OTHER): Payer: Medicaid Other | Admitting: Certified Registered"

## 2022-03-09 DIAGNOSIS — M25362 Other instability, left knee: Secondary | ICD-10-CM | POA: Diagnosis not present

## 2022-03-09 DIAGNOSIS — S83005A Unspecified dislocation of left patella, initial encounter: Secondary | ICD-10-CM | POA: Diagnosis not present

## 2022-03-09 DIAGNOSIS — M25562 Pain in left knee: Secondary | ICD-10-CM | POA: Diagnosis present

## 2022-03-09 DIAGNOSIS — M25572 Pain in left ankle and joints of left foot: Secondary | ICD-10-CM | POA: Diagnosis not present

## 2022-03-09 DIAGNOSIS — J45909 Unspecified asthma, uncomplicated: Secondary | ICD-10-CM | POA: Insufficient documentation

## 2022-03-09 DIAGNOSIS — E669 Obesity, unspecified: Secondary | ICD-10-CM | POA: Insufficient documentation

## 2022-03-09 DIAGNOSIS — S76112A Strain of left quadriceps muscle, fascia and tendon, initial encounter: Secondary | ICD-10-CM

## 2022-03-09 DIAGNOSIS — Z87891 Personal history of nicotine dependence: Secondary | ICD-10-CM | POA: Diagnosis not present

## 2022-03-09 DIAGNOSIS — R519 Headache, unspecified: Secondary | ICD-10-CM | POA: Diagnosis not present

## 2022-03-09 DIAGNOSIS — Z01818 Encounter for other preprocedural examination: Secondary | ICD-10-CM

## 2022-03-09 HISTORY — PX: KNEE ARTHROSCOPY WITH MEDIAL PATELLAR FEMORAL LIGAMENT RECONSTRUCTION: SHX5652

## 2022-03-09 LAB — PREGNANCY, URINE: Preg Test, Ur: NEGATIVE

## 2022-03-09 SURGERY — REPAIR, TENDON, PATELLAR, ARTHROSCOPIC
Anesthesia: General | Site: Knee | Laterality: Left

## 2022-03-09 MED ORDER — OXYCODONE HCL 5 MG PO TABS: 5.0000 mg | ORAL_TABLET | Freq: Once | ORAL | Status: DC | PRN

## 2022-03-09 MED ORDER — ONDANSETRON HCL 4 MG/2ML IJ SOLN
INTRAMUSCULAR | Status: AC
  Filled 2022-03-09: qty 2

## 2022-03-09 MED ORDER — CEFAZOLIN SODIUM-DEXTROSE 2-4 GM/100ML-% IV SOLN
2.0000 g | INTRAVENOUS | Status: AC
  Administered 2022-03-09: 2 g via INTRAVENOUS
  Filled 2022-03-09: qty 100

## 2022-03-09 MED ORDER — ROPIVACAINE HCL 5 MG/ML IJ SOLN
INTRAMUSCULAR | Status: DC | PRN
  Administered 2022-03-09: 30 mL via PERINEURAL

## 2022-03-09 MED ORDER — HYDROMORPHONE HCL 1 MG/ML IJ SOLN
INTRAMUSCULAR | Status: DC | PRN
Start: 2022-03-09 — End: 2022-03-09
  Administered 2022-03-09 (×2): .5 mg via INTRAVENOUS

## 2022-03-09 MED ORDER — DEXAMETHASONE SODIUM PHOSPHATE 10 MG/ML IJ SOLN
INTRAMUSCULAR | Status: DC | PRN
  Administered 2022-03-09: 10 mg via INTRAVENOUS

## 2022-03-09 MED ORDER — SODIUM CHLORIDE 0.9 % IR SOLN
Status: DC | PRN
  Administered 2022-03-09: 3000 mL

## 2022-03-09 MED ORDER — FENTANYL CITRATE (PF) 100 MCG/2ML IJ SOLN
INTRAMUSCULAR | Status: DC | PRN
Start: 2022-03-09 — End: 2022-03-09
  Administered 2022-03-09: 100 ug via INTRAVENOUS

## 2022-03-09 MED ORDER — OXYCODONE HCL 5 MG/5ML PO SOLN: 5.0000 mg | Freq: Once | ORAL | Status: DC | PRN

## 2022-03-09 MED ORDER — FENTANYL CITRATE PF 50 MCG/ML IJ SOSY
50.0000 ug | PREFILLED_SYRINGE | INTRAMUSCULAR | Status: DC
  Administered 2022-03-09: 100 ug via INTRAVENOUS
  Filled 2022-03-09: qty 2

## 2022-03-09 MED ORDER — HYDROMORPHONE HCL 1 MG/ML IJ SOLN
INTRAMUSCULAR | Status: AC
  Filled 2022-03-09: qty 2

## 2022-03-09 MED ORDER — METHOCARBAMOL 500 MG PO TABS: 500.0000 mg | ORAL_TABLET | Freq: Four times a day (QID) | ORAL | 1 refills | Status: DC | PRN

## 2022-03-09 MED ORDER — PROPOFOL 10 MG/ML IV BOLUS
INTRAVENOUS | Status: DC | PRN
  Administered 2022-03-09: 180 mg via INTRAVENOUS

## 2022-03-09 MED ORDER — LIDOCAINE 2% (20 MG/ML) 5 ML SYRINGE
INTRAMUSCULAR | Status: DC | PRN
  Administered 2022-03-09: 100 mg via INTRAVENOUS

## 2022-03-09 MED ORDER — ONDANSETRON HCL 4 MG PO TABS: 4.0000 mg | ORAL_TABLET | Freq: Three times a day (TID) | ORAL | 0 refills | Status: DC | PRN

## 2022-03-09 MED ORDER — ONDANSETRON HCL 4 MG/2ML IJ SOLN
INTRAMUSCULAR | Status: DC | PRN
  Administered 2022-03-09 (×2): 4 mg via INTRAVENOUS

## 2022-03-09 MED ORDER — HYDROMORPHONE HCL 1 MG/ML IJ SOLN
0.2500 mg | INTRAMUSCULAR | Status: DC | PRN
  Administered 2022-03-09 (×2): 0.5 mg via INTRAVENOUS

## 2022-03-09 MED ORDER — ONDANSETRON HCL 4 MG/2ML IJ SOLN: 4.0000 mg | Freq: Once | INTRAMUSCULAR | Status: DC | PRN

## 2022-03-09 MED ORDER — SUGAMMADEX SODIUM 200 MG/2ML IV SOLN
INTRAVENOUS | Status: DC | PRN
  Administered 2022-03-09: 200 mg via INTRAVENOUS

## 2022-03-09 MED ORDER — HYDROMORPHONE HCL 2 MG PO TABS: 2.0000 mg | ORAL_TABLET | ORAL | 0 refills | Status: AC | PRN

## 2022-03-09 MED ORDER — HYDROMORPHONE HCL 2 MG/ML IJ SOLN
INTRAMUSCULAR | Status: AC
  Filled 2022-03-09: qty 1

## 2022-03-09 MED ORDER — BUPIVACAINE HCL (PF) 0.25 % IJ SOLN
INTRAMUSCULAR | Status: AC
  Filled 2022-03-09: qty 30

## 2022-03-09 MED ORDER — ROCURONIUM BROMIDE 100 MG/10ML IV SOLN
INTRAVENOUS | Status: DC | PRN
  Administered 2022-03-09: 70 mg via INTRAVENOUS

## 2022-03-09 MED ORDER — DROPERIDOL 2.5 MG/ML IJ SOLN: 0.6250 mg | Freq: Once | INTRAMUSCULAR | Status: DC | PRN

## 2022-03-09 MED ORDER — DIPHENHYDRAMINE HCL 50 MG/ML IJ SOLN
INTRAMUSCULAR | Status: DC | PRN
  Administered 2022-03-09: 25 mg via INTRAVENOUS

## 2022-03-09 MED ORDER — LIDOCAINE HCL (PF) 2 % IJ SOLN
INTRAMUSCULAR | Status: AC
  Filled 2022-03-09: qty 5

## 2022-03-09 MED ORDER — BUPIVACAINE HCL (PF) 0.25 % IJ SOLN
INTRAMUSCULAR | Status: AC
  Filled 2022-03-09: qty 10

## 2022-03-09 MED ORDER — PROPOFOL 10 MG/ML IV BOLUS
INTRAVENOUS | Status: AC
  Filled 2022-03-09: qty 20

## 2022-03-09 MED ORDER — MIDAZOLAM HCL 2 MG/2ML IJ SOLN
1.0000 mg | INTRAMUSCULAR | Status: DC
  Administered 2022-03-09: 2 mg via INTRAVENOUS
  Filled 2022-03-09: qty 2

## 2022-03-09 MED ORDER — KETOROLAC TROMETHAMINE 30 MG/ML IJ SOLN
INTRAMUSCULAR | Status: AC
  Filled 2022-03-09: qty 2

## 2022-03-09 MED ORDER — KETOROLAC TROMETHAMINE 30 MG/ML IJ SOLN
INTRAMUSCULAR | Status: DC | PRN
Start: 2022-03-09 — End: 2022-03-09
  Administered 2022-03-09: 30 mg via INTRAVENOUS

## 2022-03-09 MED ORDER — ACETAMINOPHEN 10 MG/ML IV SOLN
INTRAVENOUS | Status: DC | PRN
  Administered 2022-03-09: 1000 mg via INTRAVENOUS

## 2022-03-09 MED ORDER — EPHEDRINE 5 MG/ML INJ
INTRAVENOUS | Status: AC
  Filled 2022-03-09: qty 10

## 2022-03-09 MED ORDER — PHENYLEPHRINE 40 MCG/ML (10ML) SYRINGE FOR IV PUSH (FOR BLOOD PRESSURE SUPPORT)
PREFILLED_SYRINGE | INTRAVENOUS | Status: AC
  Filled 2022-03-09: qty 20

## 2022-03-09 MED ORDER — BUPIVACAINE HCL 0.25 % IJ SOLN
INTRAMUSCULAR | Status: AC
  Filled 2022-03-09: qty 1

## 2022-03-09 MED ORDER — FENTANYL CITRATE (PF) 100 MCG/2ML IJ SOLN
INTRAMUSCULAR | Status: AC
  Filled 2022-03-09: qty 2

## 2022-03-09 MED ORDER — ORAL CARE MOUTH RINSE: 15.0000 mL | Freq: Once | OROMUCOSAL | Status: AC

## 2022-03-09 MED ORDER — CHLORHEXIDINE GLUCONATE 0.12 % MT SOLN
15.0000 mL | Freq: Once | OROMUCOSAL | Status: AC
  Administered 2022-03-09: 15 mL via OROMUCOSAL

## 2022-03-09 MED ORDER — LACTATED RINGERS IV SOLN: INTRAVENOUS | Status: DC

## 2022-03-09 MED ORDER — ACETAMINOPHEN 10 MG/ML IV SOLN
INTRAVENOUS | Status: AC
  Filled 2022-03-09: qty 100

## 2022-03-09 SURGICAL SUPPLY — 65 items
ANCHOR SUT FBRTK 2.6 KNTLS (Anchor) ×2 IMPLANT
BANDAGE ESMARK 6X9 LF (GAUZE/BANDAGES/DRESSINGS) IMPLANT
BLADE SHAVER TORPEDO 4X13 (MISCELLANEOUS) IMPLANT
BLADE SURG 15 STRL LF DISP TIS (BLADE) ×2 IMPLANT
BLADE SURG 15 STRL SS (BLADE) ×4
BNDG ELASTIC 6X5.8 VLCR STR LF (GAUZE/BANDAGES/DRESSINGS) ×2 IMPLANT
BNDG ESMARK 6X9 LF (GAUZE/BANDAGES/DRESSINGS)
CLSR STERI-STRIP ANTIMIC 1/2X4 (GAUZE/BANDAGES/DRESSINGS) ×1 IMPLANT
COVER MAYO STAND STRL (DRAPES) ×2 IMPLANT
CUFF TOURN SGL QUICK 34 (TOURNIQUET CUFF) ×2
CUFF TRNQT CYL 34X4.125X (TOURNIQUET CUFF) ×1 IMPLANT
DISSECTOR  3.8MM X 13CM (MISCELLANEOUS)
DISSECTOR 3.8MM X 13CM (MISCELLANEOUS) IMPLANT
DRAPE ARTHROSCOPY W/POUCH 114 (DRAPES) ×2 IMPLANT
DRAPE C-ARM 42X120 X-RAY (DRAPES) ×2 IMPLANT
DRAPE INCISE IOBAN 66X45 STRL (DRAPES) IMPLANT
DRAPE U-SHAPE 47X51 STRL (DRAPES) ×2 IMPLANT
DRSG PAD ABDOMINAL 8X10 ST (GAUZE/BANDAGES/DRESSINGS) ×2 IMPLANT
DURAPREP 26ML APPLICATOR (WOUND CARE) ×2 IMPLANT
ELECT REM PT RETURN 15FT ADLT (MISCELLANEOUS) ×2 IMPLANT
FHL IMPLANT SYSTEM 6.25 (Anchor) ×2 IMPLANT
GAUZE 4X4 16PLY ~~LOC~~+RFID DBL (SPONGE) ×2 IMPLANT
GAUZE SPONGE 4X4 12PLY STRL (GAUZE/BANDAGES/DRESSINGS) ×2 IMPLANT
GAUZE XEROFORM 1X8 LF (GAUZE/BANDAGES/DRESSINGS) ×2 IMPLANT
GLOVE BIO SURGEON STRL SZ7.5 (GLOVE) ×4 IMPLANT
GLOVE BIOGEL PI IND STRL 8 (GLOVE) ×2 IMPLANT
GLOVE BIOGEL PI INDICATOR 8 (GLOVE) ×2
GOWN STRL REUS W/ TWL XL LVL3 (GOWN DISPOSABLE) ×2 IMPLANT
GOWN STRL REUS W/TWL XL LVL3 (GOWN DISPOSABLE) ×4
GRAFT TISS 230-320 GRACILIS (Bone Implant) IMPLANT
IMMOBILIZER KNEE 22 UNIV (SOFTGOODS) IMPLANT
IV NS IRRIG 3000ML ARTHROMATIC (IV SOLUTION) ×4 IMPLANT
KIT BASIN OR (CUSTOM PROCEDURE TRAY) ×2 IMPLANT
KIT TURNOVER KIT A (KITS) ×2 IMPLANT
MANIFOLD NEPTUNE II (INSTRUMENTS) ×2 IMPLANT
NDL MAYO CATGUT SZ4 TPR NDL (NEEDLE) ×1 IMPLANT
NEEDLE MAYO CATGUT SZ4 (NEEDLE) ×2 IMPLANT
NS IRRIG 1000ML POUR BTL (IV SOLUTION) ×2 IMPLANT
PACK ARTHROSCOPY DSU (CUSTOM PROCEDURE TRAY) ×2 IMPLANT
PAD CAST 4YDX4 CTTN HI CHSV (CAST SUPPLIES) IMPLANT
PADDING CAST COTTON 4X4 STRL (CAST SUPPLIES)
PADDING CAST COTTON 6X4 STRL (CAST SUPPLIES) ×1 IMPLANT
PASSER SUT SWANSON 36MM LOOP (INSTRUMENTS) IMPLANT
PENCIL SMOKE EVACUATOR (MISCELLANEOUS) ×2 IMPLANT
SPONGE T-LAP 4X18 ~~LOC~~+RFID (SPONGE) ×2 IMPLANT
STRIP CLOSURE SKIN 1/2X4 (GAUZE/BANDAGES/DRESSINGS) ×2 IMPLANT
SUCTION FRAZIER HANDLE 10FR (MISCELLANEOUS) ×2
SUCTION TUBE FRAZIER 10FR DISP (MISCELLANEOUS) ×1 IMPLANT
SUT 2 FIBERLOOP 20 STRT BLUE (SUTURE)
SUT FIBERWIRE #2 38 REV NDL BL (SUTURE)
SUT FIBERWIRE #2 38 T-5 BLUE (SUTURE)
SUT MON AB 3-0 SH 27 (SUTURE) ×2
SUT MON AB 3-0 SH27 (SUTURE) ×1 IMPLANT
SUT VIC AB 0 CT2 27 (SUTURE) IMPLANT
SUT VIC AB 2-0 CT2 27 (SUTURE) IMPLANT
SUTURE 2 FIBERLOOP 20 STRT BLU (SUTURE) IMPLANT
SUTURE FIBERWR #2 38 T-5 BLUE (SUTURE) IMPLANT
SUTURE FIBERWR#2 38 REV NDL BL (SUTURE) IMPLANT
SYSTEM IMPLANT FHL 6.25 (Anchor) IMPLANT
TENDON GRACILIS FROZEN (Bone Implant) ×2 IMPLANT
TENDON GRACILIS FROZEN 230-320 (Bone Implant) ×1 IMPLANT
TOWEL OR 17X26 10 PK STRL BLUE (TOWEL DISPOSABLE) ×2 IMPLANT
TUBING ARTHROSCOPY IRRIG 16FT (MISCELLANEOUS) ×2 IMPLANT
TUBING CONNECTING 10 (TUBING) ×2 IMPLANT
WATER STERILE IRR 500ML POUR (IV SOLUTION) ×2 IMPLANT

## 2022-03-09 NOTE — Discharge Instructions (Signed)
DISCHARGE INSTRUCTIONS: ?________________________________________________________________________________ ?Ligament RECONSTRUCTION HOME EXERCISE PROGRAM (0-2 WEEKS) ? ? ?Elevate the leg above your heart as often as possible. ?Weight bear as tolerated with the immobilizer.  Use crutches as needed, progress from 2 to 1 crutch as able using one crutch on opposite side of surgical knee.  Do not limp and do not walk too much!! ?Wear immobilizer all the times.  Wear immobilizer at night!! ?You may remove postoperative bandages on the second day from surgery and begin showering at that time.  Please do not submerge incisions underwater. ?Goals for first two weeks:  minimal swelling, motion 0-90, walking with immobilizer without crutches and positive attitude about PT. ?Exercise program to be performed as soon as as able 3-4 times per day followed by ice pack or ice bag for 20 minutes. ?Sitting over edge of bed or counter - Passive Range of Motion using opposite leg to bend and straighten knee as much as possible (5-10 minutes) ?Thigh tensing exercise - Push the knee into a towel roll and try to raise heel slightly off floor.  Hold for 8 seconds, rest 10 seconds. (15 times every hour) ?Straight leg raise lying on your back with opposite knee bent, keeping surgical knee as straight as possible.  You may do with knee immobilizer initially (3-5 sets of ten) ?Hamstring tensing - Dig heels into floor as if you were attempting to bend knee.  Do not allow knee to bend.  Hold for 8 seconds, rest 10 seconds ?Towel stretch - Towel around ball of foot stretching calf by pulling the ankle back while gradually leaning forward to stretch the back of the knee and thigh.  (Hold 20 seconds, 4 times each) ?Back of knee stretch - While lying on stomach, knee just over edge of bed (hold 2 minutes, 4 times) ?Use pain medication as needed.  To prevent constipation use Colace 100mg . twice a day while on pain medication.  If constipated, use  Miralax 17 gm once a day and drink plenty of fluids.  These medications can be obtained at the pharmacy without a prescription.   ?Follow up in the office in 14 days. ?Leave dressing in place and reinforce as needed. ?For the prevention of deep vein thromboses (DVT) take an 81 mg aspirin once per day x6 weeks. ? ?

## 2022-03-09 NOTE — Brief Op Note (Signed)
03/09/2022 ? ?2:44 PM ? ?PATIENT:  Remigio Eisenmenger  31 y.o. female ? ?PRE-OPERATIVE DIAGNOSIS:  Left knee patella instability ? ?POST-OPERATIVE DIAGNOSIS:  Left knee patella instability ? ?PROCEDURE:  Procedure(s): ?LEFT KNEE ARTHROSCOPY WITH MEDIAL PATELLAR FEMORAL LIGAMENT RECONSTRUCTION WILL ALLOGRAFT (Left) ? ?SURGEON:  Surgeon(s) and Role: ?   Aundria Rud, Noah Delaine, MD - Primary ? ?PHYSICIAN ASSISTANT: Dion Saucier, PA-C ? ? ?ANESTHESIA:   regional and general ? ?EBL:  5 mL  ? ?BLOOD ADMINISTERED:none ? ?DRAINS: none  ? ?LOCAL MEDICATIONS USED:  NONE ? ?SPECIMEN:  No Specimen ? ?DISPOSITION OF SPECIMEN:  N/A ? ?COUNTS:  YES ? ?TOURNIQUET:   ?Total Tourniquet Time Documented: ?Thigh (Left) - 57 minutes ?Total: Thigh (Left) - 57 minutes ? ? ?DICTATION: .Note written in EPIC ? ?PLAN OF CARE: Discharge to home after PACU ? ?PATIENT DISPOSITION:  PACU - hemodynamically stable. ?  ?Delay start of Pharmacological VTE agent (>24hrs) due to surgical blood loss or risk of bleeding: not applicable ? ?

## 2022-03-09 NOTE — Progress Notes (Signed)
AssistedDr. Foster with left, adductor canal, ultrasound guided block. Side rails up, monitors on throughout procedure. See vital signs in flow sheet. Tolerated Procedure well. ? ?
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
## 2022-03-09 NOTE — Progress Notes (Signed)
Orthopedic Tech Progress Note ?Patient Details:  ?[name] [name] ?1991-06-15 ?[id number] ? ?Ortho Devices ?Type of Ortho Device: Crutches ?Ortho Device/Splint Interventions: Ordered ?  ?  ? ?Matteus Mcnelly E Kaesyn Johnston ?03/09/2022, 4:24 PM ? ?

## 2022-03-09 NOTE — Anesthesia Postprocedure Evaluation (Signed)
Anesthesia Post Note ? ?Patient: [name] [name] ? ?Procedure(s) Performed: LEFT KNEE ARTHROSCOPY WITH MEDIAL PATELLAR FEMORAL LIGAMENT RECONSTRUCTION WILL ALLOGRAFT (Left: Knee) ? ?  ? ?Patient location during evaluation: PACU ?Anesthesia Type: General ?Level of consciousness: sedated ?Pain management: pain level controlled ?Vital Signs Assessment: post-procedure vital signs reviewed and stable ?Respiratory status: spontaneous breathing and respiratory function stable ?Cardiovascular status: stable ?Postop Assessment: no apparent nausea or vomiting ?Anesthetic complications: no ? ? ?No notable events documented. ? ?Last Vitals:  ?Vitals:  ? 03/09/22 1530 03/09/22 1540  ?BP: 106/68 103/76  ?Pulse: 74 68  ?Resp: 14 12  ?Temp: (!) 36.4 ?C   ?SpO2: 96% 96%  ?  ?Last Pain:  ?Vitals:  ? 03/09/22 1530  ?PainSc: Asleep  ? ? ?  ?  ?  ?  ?  ?  ? ?Zaira Iacovelli DANIEL ? ? ? ? ?

## 2022-03-09 NOTE — Anesthesia Preprocedure Evaluation (Signed)
Anesthesia Evaluation  ?Patient identified by MRN, date of birth, ID band ?Patient awake ? ? ? ?Reviewed: ?Allergy & Precautions, NPO status , Patient's Chart, lab work & pertinent test results ? ?Airway ?Mallampati: II ? ?TM Distance: >3 FB ?Neck ROM: Full ? ? ? Dental ?no notable dental hx. ?(+) Caps, Dental Advisory Given, Teeth Intact ?  ?Pulmonary ?asthma , former smoker,  ?  ?Pulmonary exam normal ?breath sounds clear to auscultation ? ? ? ? ? ? Cardiovascular ?negative cardio ROS ?Normal cardiovascular exam ?Rhythm:Regular Rate:Normal ? ? ?  ?Neuro/Psych ? Headaches, PSYCHIATRIC DISORDERS Depression   ? GI/Hepatic ?negative GI ROS, Neg liver ROS,   ?Endo/Other  ?Obesity ? Renal/GU ?Renal diseaseHx/o renal calculi  ?negative genitourinary ?  ?Musculoskeletal ?Left knee patellar instability  ? Abdominal ?(+) + obese,   ?Peds ? Hematology ?negative hematology ROS ?(+)   ?Anesthesia Other Findings ? ? Reproductive/Obstetrics ? ?  ? ? ? ? ? ? ? ? ? ? ? ? ? ?  ?  ? ? ? ? ? ? ? ? ?Anesthesia Physical ?Anesthesia Plan ? ?ASA: 2 ? ?Anesthesia Plan: General  ? ?Post-op Pain Management: Regional block*  ? ?Induction: Intravenous ? ?PONV Risk Score and Plan: 4 or greater and Treatment may vary due to age or medical condition, Ondansetron and Dexamethasone ? ?Airway Management Planned: LMA ? ?Additional Equipment: None ? ?Intra-op Plan:  ? ?Post-operative Plan: Extubation in OR ? ?Informed Consent: I have reviewed the patients History and Physical, chart, labs and discussed the procedure including the risks, benefits and alternatives for the proposed anesthesia with the patient or authorized representative who has indicated his/her understanding and acceptance.  ? ? ? ?Dental advisory given ? ?Plan Discussed with: CRNA and Anesthesiologist ? ?Anesthesia Plan Comments:   ? ? ? ? ? ? ?Anesthesia Quick Evaluation ? ?

## 2022-03-09 NOTE — Anesthesia Procedure Notes (Signed)
Procedure Name: Intubation ?Date/Time: 03/09/2022 1:29 PM ?Performed by: Marny Lowenstein, CRNA ?Pre-anesthesia Checklist: Patient identified, Emergency Drugs available, Suction available and Patient being monitored ?Patient Re-evaluated:Patient Re-evaluated prior to induction ?Oxygen Delivery Method: Circle system utilized ?Preoxygenation: Pre-oxygenation with 100% oxygen ?Induction Type: IV induction and Cricoid Pressure applied ?Laryngoscope Size: Hyacinth Meeker and 2 ?Grade View: Grade I ?Tube type: Oral ?Tube size: 7.0 mm ?Number of attempts: 1 ?Airway Equipment and Method: Patient positioned with wedge pillow and Stylet ?Placement Confirmation: ETT inserted through vocal cords under direct vision, positive ETCO2 and breath sounds checked- equal and bilateral ?Secured at: 21 cm ?Tube secured with: Tape ?Comments: Modified RSI d/t pt actively vomitting liquid in short stay ? ? ? ? ?

## 2022-03-09 NOTE — Op Note (Signed)
? ?03/09/2022 ? ?2:45 PM ? ?PATIENT:  [name] [name]   ? 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 ?POST-OPERATIVE DIAGNOSIS:  Same ? ?PROCEDURE:  1. Left Knee diagnostic arthroscopy  ?2.  Left knee open medial patellofemoral ligament Reconstruction with hamstring allograft ? ?Implants:  Arthrex 2.6  mm Fibertack anchors x 2 ?Arthrex 6.25 mm biocomposite Swivelock anchor x 1 ? ?Allograft hamstring graft 5.0 mm x 200 mm ? ?SURGEON:  Yolonda Kida, MD ? ?Tourniquet:  300 mm Hg for 57 minutes ? ?ANESTHESIA:   General  with adductor canal ? ?ESTIMATED BLOOD LOSS: 5 cc ? ?PREOPERATIVE INDICATIONS:  [name] [name] is a  31 y.o. female with a diagnosis of patellar dislocation and medial patellofemoral ligament disruption who failed conservative measures and elected for surgical management.   ? ?The risks benefits and alternatives were discussed with the patient preoperatively including but not limited to the risks of infection, bleeding, nerve injury, cardiopulmonary complications, the need for revision surgery, stiffness, posttraumatic arthritis, among others, and the patient was willing to proceed. ? ? ?OPERATIVE FINDINGS: Diagnostic arthroscopy was undertaken, No chondromalacia in any of the 3 compartments of the knee.  Very healthy appearing cartilage throughout.  The femoral trochlea was extremely shallow. The medial and lateral compartments were normal and there were no meniscal tears. The anterior cruciate ligament and PCL were intact. She had substantial patellar subluxation and tilt prior to repair.  Interestingly, throughout the medial and lateral compartment as well as the intercondylar notch there was some viscous yellow contents noted in splotchy areas about the knee.  This was not able to be suctioned up with the shaver and was not able to be scraped off easily with the probe.  Postoperatively She had appropriate tracking, despite the shallow  trochlea, and She tracked centrally. ? ?OPERATIVE PROCEDURE: The patient was brought to the operating room placed in the supine position. IV antibiotics were given. General anesthesia was administered. The lower extremity was prepped and draped in usual sterile fashion. Time out was performed. The leg was elevated exsanguinated and tourniquet was inflated. Diagnostic arthroscopy was carried out with the above-named findings. I switched portals to evaluate the tracking of the patella viewing from the medial portal, and also completed my chondroplasty this way. ? ?I then removed the arthroscopic instruments, and made an incision proximal to the patella down to the proximal one third of the patella through the skin. I then elevated the fascial layer of the quadriceps investment, and mobilized this. I then made an incision through the deep capsular layer including the MPL.  I next established to point of fixation form of a ligament reconstruction.  The superior site was determined proximally 2 cm inferior to the superior pole of the patella.   Another site for the second anchor was established 15 mm inferior to the first anchor.  2 drill pins were placed in the predetermined locations for the anchors.  These were placed in the anterior half of the patella.  Care was taken to avoid penetration of the cartilage surface.  After the guide pins were drilled in a parallel fashion 15 mm apart, we then impacted the 2.6 mm fiber tack anchors into place.  We then placed the allograft in an onlay technique with the midpoint of the graft just between each anchor and secured this with the knotless mechanisms of each anchor sequentially.  The residual limbs of the allograft were then whipstitched together with a looped FiberWire suture.  I next tested the integrity of that purchase by manually pulling on the graft and it did have excellent purchase.   ? ?We next moved to the femoral sided fixation.  We established a layer to pass our  graft between the vastus medialis oblique is in the capsule of the knee.  This would ensure that our ligament was passed in an extra capsular fashion.  Once that plane was established with tonsil dissection we then used fluoroscopy to locate the femoral attachment of the medial patellofemoral ligament.  This was located by finding the sulcus between the abductor tubercle and the medial epicondyle.  On radiographic imaging, utilizing the perfect lateral of the distal femur, we found the point just distal to the posterior aspect of the medial femoral condyle, and just anterior to the continuation of the posterior femoral cortex.  Once that point was confirmed on lateral view we then drilled the guidepin for the femoral anchor.  This guidepin was drilled across the femur and exited the lateral cortex and lateral skin.  A nitinol wire was placed adjacent to that pin.  We next utilized the reamer to open the femoral socket to receive the graft and composite tenodesis screw.  Now utilizing a passing suture through the previous established layer just deep to the VMO we passed the allograft.  The allograft was then pulled into the femoral socket utilizing the previously drilled Beath pin.  Care was taken not to over tension the patella and the knee was flexed to approximately 30?Marland Kitchen  In this position the lateral aspect of the patella was ensured to be at the lateral aspect of the trochlea.  Once that was confirmed we then advanced to the 6.25 mm bio composite swivel lock anchor into the procedural femoral socket.  This had excellent purchase.  The patella was then reexamined through dynamic examination and was found to not be able to dislocate as it had previously been. ? ?Finally, we utilized the sutures from the patellar anchors to close the capsular layer over the medial border of the patella.  This was done in a horizontal mattress fashion and in a pants over vest manner. I then repaired the layer in involving the vastus  medialis, using a pants over vest repair with 0 Vicryl. This provided excellent augmentation to the repair. I then inserted the scope through the medial portal again, and confirm that I had appropriate translation and correction of patellar tilt.   ? ?After irrigating was copiously and repaired the tissue with Vicryl, 2-0 Monocryl for the subcutaneous layer, and 3-0 Monocryl for a subcuticular running closure of the skin.  The skin with Steri-Strips and sterile gauze. She received a postoperative block. The incisions were injected with quarter percent plain Marcaine.  Sterile dressings were applied. The tourniquet was released after 54 minutes.  She was awakened and returned to the PACU in stable and satisfactory condition. There were no complications. ? ?Disposition: ? ?The patient will be partial weightbearing to the operative extremity until she is cleared by physical therapy for full weightbearing once her quadriceps has returned to normal function.  The operative extremity will be locked in full extension in a hinged knee brace.  They will begin physical therapy in 1 week.  They will take twice daily aspirin for 6 weeks for prevention of blood clots.  I will see them back in the office in 2 weeks for wound check ? ?

## 2022-03-09 NOTE — Progress Notes (Signed)
Orthopedic Tech Progress Note ?Patient Details:  ?[name] [name] ?15-Oct-1991 ?[id number] ?[employer]ace has been ordered from [employer]  ?Patient ID: [name] [name], female   DOB: [birth year], 31 y.o.   MRN: [id number] ? ?[name] [name] ?03/09/2022, 12:58 PM ? ?

## 2022-03-09 NOTE — Anesthesia Procedure Notes (Addendum)
?  Anesthesia Regional Block: Adductor canal block  ? ?Pre-Anesthetic Checklist: , timeout performed,  Correct Patient, Correct Site, Correct Laterality,  Correct Procedure, Correct Position, site marked,  Risks and benefits discussed,  Surgical consent,  Pre-op evaluation,  At surgeon's request and post-op pain management ? ?Laterality: Left ? ?Prep: chloraprep     ?  ?Needles:  ?Injection technique: Single-shot ? ?Needle Type: Echogenic Stimulator Needle   ? ? ?Needle Length: 10cm  ?Needle Gauge: 21  ? ?Needle insertion depth: 8 cm ? ? ?Additional Needles: ? ? ?Procedures:,,,, ultrasound used (permanent image in chart),,    ?Narrative:  ?Start time: 03/09/2022 11:55 AM ?End time: 03/09/2022 12:00 PM ?Injection made incrementally with aspirations every 5 mL. ? ?Performed by: Personally  ?Anesthesiologist: Mal Amabile, MD ? ?Additional Notes: ?Timeout performed. Patient sedated. Relevant anatomy ID'd using [place]. Incremental 2-31ml injection of LA with frequent aspiration. Patient tolerated procedure well. ? ? ? ? ?

## 2022-03-09 NOTE — Transfer of Care (Signed)
Immediate Anesthesia Transfer of Care Note ? ?Patient: [name] [name] ? ?Procedure(s) Performed: LEFT KNEE ARTHROSCOPY WITH MEDIAL PATELLAR FEMORAL LIGAMENT RECONSTRUCTION WILL ALLOGRAFT (Left: Knee) ? ?Patient Location: PACU ? ?Anesthesia Type:General and Regional ? ?Level of Consciousness: drowsy and patient cooperative ? ?Airway & Oxygen Therapy: Patient Spontanous Breathing and Patient connected to face mask oxygen ? ?Post-op Assessment: Report given to RN and Post -op Vital signs reviewed and stable ? ?Post vital signs: Reviewed and stable ? ?Last Vitals:  ?Vitals Value Taken Time  ?BP 101/67 03/09/22 1454  ?Temp    ?Pulse 76 03/09/22 1457  ?Resp 23 03/09/22 1457  ?SpO2 96 % 03/09/22 1457  ?Vitals shown include unvalidated device data. ? ?Last Pain:  ?Vitals:  ? 03/09/22 1230  ?PainSc: 0-No pain  ?   ? ?Patients Stated Pain Goal: 8 (03/09/22 1150) ? ?Complications: No notable events documented. ?

## 2022-03-12 ENCOUNTER — Encounter (HOSPITAL_COMMUNITY): Payer: Self-pay | Admitting: Orthopedic Surgery

## 2022-03-13 ENCOUNTER — Other Ambulatory Visit (HOSPITAL_COMMUNITY): Payer: Self-pay

## 2022-03-13 NOTE — Telephone Encounter (Signed)
Patient Advocate Encounter ? ?Prior Authorization for [employer] 120mg /ml has been approved.   ? ?PA# [id number] ? ?Effective dates: 02/22/22 through 05/25/22 ? ?Per Test Claim Patients co-pay is $4.  ? ?Spoke with Pharmacy to Process. ? ?Patient Advocate ?Fax: [phone]  ?

## 2022-03-21 NOTE — Progress Notes (Deleted)
NEUROLOGY FOLLOW UP OFFICE NOTE  [name] [name] [id number]  Assessment/Plan:   Chronic migraine without aura, without status migrainosus, not intractable, complicated by medication-overuse   Migraine prevention:  Stop Aimovig.  Start Emgality Migraine rescue:  Nurtec and promethazine Stop OTC analgesics for headache treatment.  Limit use of pain relievers to no more than 2 days out of week to prevent risk of rebound or medication-overuse headache.  May treat knee pain as needed. Keep headache diary Follow up 6 months.     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   UPDATE: Switched from Aimovig to [employer]  Nurtec Intensity:  severe Duration:  All day Frequency:  daily She had 5 day migraine in September, for which she was treated in the ED.   Current NSAIDs/analgesics: ibuprofen, acetaminophen, Excedrin, naproxen Current triptans:  rizatriptan 10mg  Current ergotamine:  none Current anti-emetic:  Promethazine 25mg  Current muscle relaxants:  none Current anti-anxiolytic:  none Current sleep aide:  none Current Antihypertensive medications:  none Current Antidepressant medications:  none Current Anticonvulsant medications:  none Current anti-CGRP:  Emgality Current Vitamins/Herbal/Supplements:  none Current Antihistamines/Decongestants:  none Other therapy:  none Hormone/birth control:  Depo-Provera Other medications:  omeprazole   Caffeine:  none Diet:  Increased water intake Exercise:  yes Depression:  no; Anxiety:  no Other pain:  Left knee pain (tore ACL).   Sleep hygiene:  3 to 4 hours a night.       HISTORY: Onset:  31 years old Location:  Bifrontal/temporal, sometimes back of neck Quality:  Pounding, throbbing, burning and pressure Initial intensity:  Severe.  She denies new headache, thunderclap headache Aura:  none Premonitory Phase:  none Postdrome:   lightheaded Associated symptoms:  Nausea, vomiting, photophobia, phonophobia, lightheadedness.  She denies associated visual disturbance or unilateral numbness or weakness. Initial duration:  48 hours Initial frequency:  At least once every 2 weeks Triggers:  none Relieving factors:  Lay down in dark room Activity:  Cannot get out of bed   Due to her chronic headaches, she had been taking NSAIDs.  She has chronic gastropathy with history of gastric ulcers, which may be due to chronic NSAID use.   CT head without contrast performed on 09/11/2017 to evaluate headache was personally reviewed and was normal.   Past NSAIDS:  Ibuprofen 800mg ; naproxen 500mg  Past analgesics:  tramadol Past abortive triptans:  sumatriptan 100mg , rizatriptan 10mg  Past abortive ergotamine:  none Past muscle relaxants:  Flexeril; Robaxin Past anti-emetic:  Reglan 10mg ; Zofran Past antihypertensive medications:  none Past antidepressant medications:  venlafaxine Past anticonvulsant medications:  topiramate, gabapentin Past anti-CGRP:  Aimovig 140mg  Past vitamins/Herbal/Supplements:  none Past antihistamines/decongestants:  Benadryl Other past therapies:  none     Family history of headache:  Mother   PAST MEDICAL HISTORY: 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     albuterol (VENTOLIN HFA) 108 (90 Base) MCG/ACT inhaler Inhale 2 puffs into the lungs every 6 (six) hours as needed for wheezing or shortness of breath.  EPINEPHrine 0.3 mg/0.3 mL IJ SOAJ injection Inject 0.3 mLs (0.3 mg total) into the muscle as needed for anaphylaxis. 2 each  3   Erenumab-aooe (AIMOVIG, 140 MG DOSE,) 70 MG/ML SOAJ Inject 140 mg into the skin every 3 (three) months.     Lidocaine 4 % PTCH Apply 1 patch topically daily as needed (pain).     medroxyPROGESTERone Acetate (DEPO-PROVERA IM) Inject 1 each into the muscle every 3 (three) months.     Melatonin 5 MG CHEW Chew 5 mg by mouth at bedtime.     methocarbamol (ROBAXIN) 500 MG tablet Take 1 tablet (500 mg total) by mouth every 6 (six) hours as needed for muscle spasms. 50 tablet 1   Multiple Vitamins-Minerals (HAIR/SKIN/NAILS/BIOTIN PO) Take 2 tablets by mouth daily.     naproxen (NAPROSYN) 500 MG tablet Take 500 mg by mouth 2 (two) times daily as needed for pain. Take with food.     ondansetron (ZOFRAN) 4 MG tablet Take 1 tablet (4 mg total) by mouth every 8 (eight) hours as needed for nausea or vomiting. 20 tablet 0   promethazine (PHENERGAN) 25 MG tablet Take 25 mg by mouth every 8 (eight) hours as needed for nausea or vomiting.     tiZANidine (ZANAFLEX) 4 MG tablet Take 1 tablet (4 mg total) by mouth at bedtime as needed for muscle spasms. 30 tablet 5   No current facility-administered medications on file prior to visit.    ALLERGIES: Allergies  Allergen Reactions   Iohexol Hives, Shortness Of Breath, Itching, Nausea And Vomiting, Swelling and Other (See Comments)    Also caused chest pain PER DR BARRY, PRE MED PROTOCOL SHOULD BE ADMINISTERED PRIOR TO SCANS IN THE FUTURE.  PT HAD A SIGNIFICANT REACTION.  [facility]  08/18/17 Patient had a reaction of SOB, swelling even though premedicated. Physician states no further contrast imaging in the future.   Peanut-Containing Drug Products Anaphylaxis and Swelling    Facial swelling   Other Swelling    Tree nuts cause facial swelling   Sulfa Antibiotics Swelling    Facial swelling (reported by [facility] 2013)   Iodinated Contrast Media Rash    FAMILY HISTORY: Family History  Problem Relation Age of Onset   Hypertension Mother    Stroke Mother     CAD Mother 85       MI on 10/18   Colon cancer Neg Hx    Stomach cancer Neg Hx    Rectal cancer Neg Hx       Objective:  *** General: No acute distress.  Patient appears ***-groomed.   Head:  Normocephalic/atraumatic Eyes:  Fundi examined but not visualized Neck: supple, no paraspinal tenderness, full range of motion Heart:  Regular rate and rhythm Lungs:  Clear to auscultation bilaterally Back: No paraspinal tenderness Neurological Exam: alert and oriented to person, place, and time.  Speech fluent and not dysarthric, language intact.  CN II-XII intact. Bulk and tone normal, muscle strength 5/5 throughout.  Sensation to light touch intact.  Deep tendon reflexes 2+ throughout, toes downgoing.  Finger to nose testing intact.  Gait normal, Romberg negative.   Shon Millet, DO  CC: ***

## 2022-03-23 ENCOUNTER — Encounter: Payer: Self-pay | Admitting: Neurology

## 2022-03-23 ENCOUNTER — Ambulatory Visit: Payer: Medicaid Other | Admitting: Neurology

## 2022-03-23 ENCOUNTER — Other Ambulatory Visit (HOSPITAL_COMMUNITY): Payer: Self-pay | Admitting: Physician Assistant

## 2022-03-23 ENCOUNTER — Ambulatory Visit (HOSPITAL_COMMUNITY)
Admission: RE | Admit: 2022-03-23 | Discharge: 2022-03-23 | Disposition: A | Discharge status: Home / Self Care | Payer: Medicaid Other | Source: Ambulatory Visit | Attending: Orthopedic Surgery | Admitting: Orthopedic Surgery

## 2022-03-23 DIAGNOSIS — M79605 Pain in left leg: Secondary | ICD-10-CM | POA: Insufficient documentation

## 2022-03-23 DIAGNOSIS — Z029 Encounter for administrative examinations, unspecified: Secondary | ICD-10-CM

## 2022-07-25 ENCOUNTER — Encounter (HOSPITAL_COMMUNITY): Payer: Self-pay | Admitting: Emergency Medicine

## 2022-07-25 ENCOUNTER — Ambulatory Visit (HOSPITAL_COMMUNITY)
Admission: EM | Admit: 2022-07-25 | Discharge: 2022-07-25 | Disposition: A | Discharge status: Home / Self Care | Payer: Medicaid Other | Attending: Nurse Practitioner | Admitting: Nurse Practitioner

## 2022-07-25 DIAGNOSIS — H1033 Unspecified acute conjunctivitis, bilateral: Secondary | ICD-10-CM | POA: Diagnosis present

## 2022-07-25 MED ORDER — OLOPATADINE HCL 0.1 % OP SOLN: 1.0000 [drp] | Freq: Two times a day (BID) | OPHTHALMIC | 12 refills | Status: DC

## 2022-07-25 MED ORDER — ERYTHROMYCIN 5 MG/GM OP OINT: TOPICAL_OINTMENT | OPHTHALMIC | 0 refills | Status: DC

## 2022-07-25 NOTE — ED Triage Notes (Signed)
Pt woke up with left eye crusted shut. Now has right eye crusted shut. Reports both eyes burning. Used OTC eye drops without relief.

## 2022-07-25 NOTE — ED Provider Notes (Signed)
[facility]    CSN: [id number] Arrival date & time: 07/25/22  1727      History   Chief Complaint Chief Complaint  Patient presents with   Eye Drainage    HPI [name] [name] is a 31 y.o. female.   HPI  She is in today complaining of right eye drainage. She reports that when she woke up on today she was having left eye drainage and her left eye was closed shut. She felt like she had a piece of tobacco in her eye. She endorses that her sister was in her bed while she was at work. She is not sure of what other things she may have come in contact with. She reports using some OTC eye drops and now she has right eye drainage. Denies fever, chills, headache, dizziness, shortness of breath, dyspnea on exertion, chest pain, nausea, vomiting, diarrhea.    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   Ankle injury 2021   housekeeping cart hit ankle   Asthma exercise induced   Depression    Headache(784.0)    MIGRAINES    Patient Active Problem List   Diagnosis Date Noted   Hydronephrosis of right kidney 01/27/2021   Nephrolithiasis 01/26/2021   Asthma 09/10/2017    Past Surgical History:  Procedure Laterality Date   BIOPSY  05/27/2018   Procedure: BIOPSY;  Surgeon: Graylin Shiver, MD;  Location: [facility] ENDOSCOPY;  Service: Endoscopy;;   ESOPHAGOGASTRODUODENOSCOPY (EGD) WITH PROPOFOL N/A 09/11/2017   Procedure: ESOPHAGOGASTRODUODENOSCOPY (EGD) WITH PROPOFOL;  Surgeon: Charlott Rakes, MD;  [facility] ENDOSCOPY;  Service: Endoscopy;  Laterality: N/A;   ESOPHAGOGASTRODUODENOSCOPY (EGD) WITH PROPOFOL N/A 02/20/2018   Procedure: ESOPHAGOGASTRODUODENOSCOPY (EGD) WITH PROPOFOL;  Surgeon: Rachael Fee, MD;  Location: [facility] ENDOSCOPY;  Service: Endoscopy;  Laterality: N/A;   ESOPHAGOGASTRODUODENOSCOPY (EGD) WITH PROPOFOL N/A 05/27/2018   Procedure: ESOPHAGOGASTRODUODENOSCOPY (EGD) WITH PROPOFOL WITH CLIP PLACEMENT;  Surgeon: Graylin Shiver, MD;   Location: [facility] ENDOSCOPY;  Service: Endoscopy;  Laterality: N/A;   INSERTION OF MESH N/A 12/26/2016   Procedure: INSERTION OF MESH;  Surgeon: Abigail Miyamoto, MD;  Location: [facility] ORS;  Service: General;  Laterality: N/A;   KNEE ARTHROSCOPY WITH MEDIAL PATELLAR FEMORAL LIGAMENT RECONSTRUCTION Left 03/09/2022   Procedure: LEFT KNEE ARTHROSCOPY WITH MEDIAL PATELLAR FEMORAL LIGAMENT RECONSTRUCTION WILL ALLOGRAFT;  Surgeon: Yolonda Kida, MD;  Location: [facility]S;  Service: Orthopedics;  Laterality: Left;   LAPAROSCOPY N/A 12/26/2016   Procedure: LAPAROSCOPY DIAGNOSTIC;  Surgeon: Abigail Miyamoto, MD;  Location: [facility] ORS;  Service: General;  Laterality: N/A;   UMBILICAL HERNIA REPAIR N/A 12/26/2016   Procedure: HERNIA REPAIR UMBILICAL ADULT;  Surgeon: Abigail Miyamoto, MD;  Location: [facility] ORS;  Service: General;  Laterality: N/A;    OB History     Gravida  1   Para  1   Term  1   Preterm      AB      Living  1      SAB      IAB      Ectopic      Multiple      Live Births  1            Home Medications    Prior to Admission medications   Medication Sig Start Date End Date Taking? Authorizing Provider  erythromycin ophthalmic ointment Place a 1/2 inch ribbon of ointment into the lower eyelid. 07/25/22  Yes Barbette Merino, NP  olopatadine (PATADAY) 0.1 % ophthalmic solution Place 1 drop into both eyes 2 (two) times daily. 07/25/22  Yes Barbette Merino, NP  acetaminophen (TYLENOL) 650 MG CR tablet Take 1,300 mg by mouth every 8 (eight) hours as needed for pain.    [provider]  albuterol (VENTOLIN HFA) 108 (90 Base) MCG/ACT inhaler Inhale 2 puffs into the lungs every 6 (six) hours as needed for wheezing or shortness of breath.    [provider]  EPINEPHrine 0.3 mg/0.3 mL IJ SOAJ injection Inject 0.3 mLs (0.3 mg total) into the muscle as needed for anaphylaxis. 03/19/20   Terrilee Files, MD  Erenumab-aooe (AIMOVIG, 140 MG DOSE,) 70 MG/ML SOAJ Inject 140  mg into the skin every 3 (three) months.    [provider]  Lidocaine 4 % PTCH Apply 1 patch topically daily as needed (pain).    [provider]  medroxyPROGESTERone Acetate (DEPO-PROVERA IM) Inject 1 each into the muscle every 3 (three) months.    [provider]  Melatonin 5 MG CHEW Chew 5 mg by mouth at bedtime.    [provider]  methocarbamol (ROBAXIN) 500 MG tablet Take 1 tablet (500 mg total) by mouth every 6 (six) hours as needed for muscle spasms. 03/09/22   Yolonda Kida, MD  Multiple Vitamins-Minerals (HAIR/SKIN/NAILS/BIOTIN PO) Take 2 tablets by mouth daily.    [provider]  naproxen (NAPROSYN) 500 MG tablet Take 500 mg by mouth 2 (two) times daily as needed for pain. Take with food. 01/05/21   [provider]  ondansetron (ZOFRAN) 4 MG tablet Take 1 tablet (4 mg total) by mouth every 8 (eight) hours as needed for nausea or vomiting. 03/09/22   Yolonda Kida, MD  promethazine (PHENERGAN) 25 MG tablet Take 25 mg by mouth every 8 (eight) hours as needed for nausea or vomiting.    [provider]  tiZANidine (ZANAFLEX) 4 MG tablet Take 1 tablet (4 mg total) by mouth at bedtime as needed for muscle spasms. 09/07/21   Drema Dallas, DO    Family History Family History  Problem Relation Age of Onset   Hypertension Mother    Stroke Mother    CAD Mother 36       MI on 10/18   Colon cancer Neg Hx    Stomach cancer Neg Hx    Rectal cancer Neg Hx     Social History Social History   Tobacco Use   Smoking status: Former    Packs/day: 0.25    Years: 4.00    Total pack years: 1.00    Types: Cigarettes    Quit date: 2019    Years since quitting: 4.6   Smokeless tobacco: Never  Vaping Use   Vaping Use: Never used  Substance Use Topics   Alcohol use: Yes    Comment: rarely   Drug use: No     Allergies   Iohexol, Peanut-containing drug products, Other, Sulfa antibiotics, and Iodinated contrast  media   Review of Systems Review of Systems   Physical Exam Triage Vital Signs ED Triage Vitals  Enc Vitals Group     BP 07/25/22 1832 125/82     Pulse Rate 07/25/22 1832 69     Resp 07/25/22 1832 16     Temp 07/25/22 1832 98.2 F (36.8 C)     Temp Source 07/25/22 1832 Oral     SpO2 07/25/22 1832 97 %     Weight --  Height --      Head Circumference --      Peak Flow --      Pain Score 07/25/22 1830 9     Pain Loc --      Pain Edu? --      Excl. in GC? --    No data found.  Updated Vital Signs BP 125/82 (BP Location: Right Arm)   Pulse 69   Temp 98.2 F (36.8 C) (Oral)   Resp 16   LMP 07/15/2022   SpO2 97%   Visual Acuity Right Eye Distance:   Left Eye Distance:   Bilateral Distance:    Right Eye Near:   Left Eye Near:    Bilateral Near:     Physical Exam HENT:     Head: Normocephalic and atraumatic.  Eyes:     General:        Right eye: Discharge (exudate) present.        Left eye: Discharge present. Cardiovascular:     Rate and Rhythm: Normal rate.  Pulmonary:     Effort: Pulmonary effort is normal.  Neurological:     Mental Status: She is alert.      UC Treatments / Results  Labs (all labs ordered are listed, but only abnormal results are displayed) Labs Reviewed  AEROBIC CULTURE W GRAM STAIN (SUPERFICIAL SPECIMEN)    EKG   Radiology No results found.  Procedures Procedures (including critical care time)  Medications Ordered in UC Medications - No data to display  Initial Impression / Assessment and Plan / UC Course  I have reviewed the triage vital signs and the nursing notes.  Pertinent labs & imaging results that were available during my care of the patient were reviewed by me and considered in my medical decision making (see chart for details).     Acute conjunctivitis of both eyes Final Clinical Impressions(s) / UC Diagnoses   Final diagnoses:  Acute conjunctivitis of both eyes, unspecified acute conjunctivitis  type     Discharge Instructions      Erythromycin Opthalmic 1/2 ribbon ointment lower eyelid  Pataday 0.1% twice a day Establish with opthalmology referral placed         ED Prescriptions     Medication Sig Dispense Auth. Provider   erythromycin ophthalmic ointment Place a 1/2 inch ribbon of ointment into the lower eyelid. 3.5 g Barbette Merino, NP   olopatadine (PATADAY) 0.1 % ophthalmic solution Place 1 drop into both eyes 2 (two) times daily. 5 mL Barbette Merino, NP      PDMP not reviewed this encounter.   Thad [place], [place] 08/03/22 [phone]

## 2022-07-25 NOTE — Discharge Instructions (Addendum)
Erythromycin Opthalmic 1/2 ribbon ointment lower eyelid  Pataday 0.1% twice a day Establish with opthalmology referral placed

## 2022-07-30 LAB — AEROBIC CULTURE W GRAM STAIN (SUPERFICIAL SPECIMEN)

## 2022-12-07 ENCOUNTER — Encounter (HOSPITAL_COMMUNITY): Payer: Self-pay

## 2022-12-07 ENCOUNTER — Ambulatory Visit (HOSPITAL_COMMUNITY)
Admission: EM | Admit: 2022-12-07 | Discharge: 2022-12-07 | Disposition: A | Discharge status: Home / Self Care | Payer: Medicaid Other | Attending: Physician Assistant | Admitting: Physician Assistant

## 2022-12-07 DIAGNOSIS — Z91038 Other insect allergy status: Secondary | ICD-10-CM

## 2022-12-07 DIAGNOSIS — L509 Urticaria, unspecified: Secondary | ICD-10-CM

## 2022-12-07 MED ORDER — FAMOTIDINE 40 MG PO TABS: 40.0000 mg | ORAL_TABLET | Freq: Every day | ORAL | 1 refills | Status: DC

## 2022-12-07 MED ORDER — PREDNISONE 10 MG (21) PO TBPK: ORAL_TABLET | ORAL | 0 refills | Status: DC

## 2022-12-07 MED ORDER — CETIRIZINE HCL 10 MG PO TABS: 10.0000 mg | ORAL_TABLET | Freq: Every day | ORAL | 0 refills | Status: DC

## 2022-12-07 MED ORDER — MUPIROCIN 2 % EX OINT: 1.0000 | TOPICAL_OINTMENT | Freq: Two times a day (BID) | CUTANEOUS | 0 refills | Status: DC

## 2022-12-07 NOTE — ED Provider Notes (Signed)
[facility]    CSN: [id number] Arrival date & time: 12/07/22  1844      History   Chief Complaint Chief Complaint  Patient presents with   Insect Bite    HPI [name] [name] is a 32 y.o. female.   Patient presents today with a 5-hour history of enlarging lesion on her right arm.  Reports that she felt something bite her but did not witness the bite.  Since that time she has had enlarging pruritic and painful rash that has spread into her arm.  She was evaluated by a PA at her job [employer]) who gave her a dose of ibuprofen.  She then continued to have worsening symptoms prompting evaluation in our clinic.  She does have a history of allergies and often has significant reaction with mosquito bites but states current symptoms are not similar to previous episodes of this condition.  She also took some Benadryl which provided minimal improvement of symptoms.  She denies any swelling of her throat, shortness of breath, weakness.  She denies any history of diabetes or immunosuppression.  Denies history of recurrent skin infections or MRSA.    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   Ankle injury 2021   housekeeping cart hit ankle   Asthma exercise induced   Depression    Headache(784.0)    MIGRAINES    Patient Active Problem List   Diagnosis Date Noted   Hydronephrosis of right kidney 01/27/2021   Nephrolithiasis 01/26/2021   Asthma 09/10/2017    Past Surgical History:  Procedure Laterality Date   BIOPSY  05/27/2018   Procedure: BIOPSY;  Surgeon: Wonda Horner, MD;  Location: [facility];  Service: Endoscopy;;   ESOPHAGOGASTRODUODENOSCOPY (EGD) WITH PROPOFOL N/A 09/11/2017   Procedure: ESOPHAGOGASTRODUODENOSCOPY (EGD) WITH PROPOFOL;  Surgeon: Wilford Corner, MD;  [facility] ENDOSCOPY;  Service: Endoscopy;  Laterality: N/A;   ESOPHAGOGASTRODUODENOSCOPY (EGD) WITH PROPOFOL N/A 02/20/2018   Procedure:  ESOPHAGOGASTRODUODENOSCOPY (EGD) WITH PROPOFOL;  Surgeon: Milus Banister, MD;  Location: [facility] ENDOSCOPY;  Service: Endoscopy;  Laterality: N/A;   ESOPHAGOGASTRODUODENOSCOPY (EGD) WITH PROPOFOL N/A 05/27/2018   Procedure: ESOPHAGOGASTRODUODENOSCOPY (EGD) WITH PROPOFOL WITH CLIP PLACEMENT;  Surgeon: Wonda Horner, MD;  Location: [facility] ENDOSCOPY;  Service: Endoscopy;  Laterality: N/A;   INSERTION OF MESH N/A 12/26/2016   Procedure: INSERTION OF MESH;  Surgeon: Coralie Keens, MD;  [facility] ORS;  Service: General;  Laterality: N/A;   KNEE ARTHROSCOPY WITH MEDIAL PATELLAR FEMORAL LIGAMENT RECONSTRUCTION Left 03/09/2022   Procedure: LEFT KNEE ARTHROSCOPY WITH MEDIAL PATELLAR FEMORAL LIGAMENT RECONSTRUCTION WILL ALLOGRAFT;  Surgeon: Nicholes Stairs, MD;  Location[facility];  Service: Orthopedics;  Laterality: Left;   LAPAROSCOPY N/A 12/26/2016   Procedure: LAPAROSCOPY DIAGNOSTIC;  Surgeon: Coralie Keens, MD;  Location: [facility] ORS;  Service: General;  Laterality: N/A;   UMBILICAL HERNIA REPAIR N/A 12/26/2016   Procedure: HERNIA REPAIR UMBILICAL ADULT;  Surgeon: Coralie Keens, MD;  [facility]: [facility];  Service: General;  Laterality: N/A;    OB History     Gravida  1   Para  1   Term  1   Preterm      AB      Living  1      SAB      IAB      Ectopic      Multiple      Live Births  1  Home Medications    Prior to Admission medications   Medication Sig Start Date End Date Taking? Authorizing Provider  cetirizine (ZYRTEC ALLERGY) 10 MG tablet Take 1 tablet (10 mg total) by mouth daily. 12/07/22  Yes Oddie Bottger K, PA-C  famotidine (PEPCID) 40 MG tablet Take 1 tablet (40 mg total) by mouth at bedtime. 12/07/22  Yes Jerold Yoss, Denny Peon K, PA-C  mupirocin ointment (BACTROBAN) 2 % Apply 1 Application topically 2 (two) times daily. 12/07/22  Yes Nahmir Zeidman K, PA-C  predniSONE (STERAPRED UNI-PAK 21 TAB) 10 MG (21) TBPK tablet As directed 12/07/22  Yes Carolle Ishii K, PA-C   acetaminophen (TYLENOL) 650 MG CR tablet Take 1,300 mg by mouth every 8 (eight) hours as needed for pain.    [provider]  albuterol (VENTOLIN HFA) 108 (90 Base) MCG/ACT inhaler Inhale 2 puffs into the lungs every 6 (six) hours as needed for wheezing or shortness of breath.    [provider]  EPINEPHrine 0.3 mg/0.3 mL IJ SOAJ injection Inject 0.3 mLs (0.3 mg total) into the muscle as needed for anaphylaxis. 03/19/20   Terrilee Files, MD  Erenumab-aooe (AIMOVIG, 140 MG DOSE,) 70 MG/ML SOAJ Inject 140 mg into the skin every 3 (three) months.    [provider]  erythromycin ophthalmic ointment Place a 1/2 inch ribbon of ointment into the lower eyelid. 07/25/22   Barbette Merino, NP  Lidocaine 4 % PTCH Apply 1 patch topically daily as needed (pain).    [provider]  medroxyPROGESTERone Acetate (DEPO-PROVERA IM) Inject 1 each into the muscle every 3 (three) months.    [provider]  Melatonin 5 MG CHEW Chew 5 mg by mouth at bedtime.    [provider]  methocarbamol (ROBAXIN) 500 MG tablet Take 1 tablet (500 mg total) by mouth every 6 (six) hours as needed for muscle spasms. 03/09/22   Yolonda Kida, MD  Multiple Vitamins-Minerals (HAIR/SKIN/NAILS/BIOTIN PO) Take 2 tablets by mouth daily.    [provider]  naproxen (NAPROSYN) 500 MG tablet Take 500 mg by mouth 2 (two) times daily as needed for pain. Take with food. 01/05/21   [provider]  olopatadine (PATADAY) 0.1 % ophthalmic solution Place 1 drop into both eyes 2 (two) times daily. 07/25/22   Barbette Merino, NP  ondansetron (ZOFRAN) 4 MG tablet Take 1 tablet (4 mg total) by mouth every 8 (eight) hours as needed for nausea or vomiting. 03/09/22   Yolonda Kida, MD  promethazine (PHENERGAN) 25 MG tablet Take 25 mg by mouth every 8 (eight) hours as needed for nausea or vomiting.    [provider]  tiZANidine (ZANAFLEX) 4 MG tablet Take 1 tablet  (4 mg total) by mouth at bedtime as needed for muscle spasms. 09/07/21   Drema Dallas, DO    Family History Family History  Problem Relation Age of Onset   Hypertension Mother    Stroke Mother    CAD Mother 30       MI on 10/18   Colon cancer Neg Hx    Stomach cancer Neg Hx    Rectal cancer Neg Hx     Social History Social History   Tobacco Use   Smoking status: Former    Packs/day: 0.25    Years: 4.00    Total pack years: 1.00    Types: Cigarettes    Quit date: 2019    Years since quitting: 5.0   Smokeless tobacco: Never  Vaping Use   Vaping Use: Never used  Substance Use Topics   Alcohol use: Yes    Comment: rarely   Drug use: No     Allergies   Iohexol, Peanut-containing drug products, Other, Sulfa antibiotics, and Iodinated contrast media   Review of Systems Review of Systems  Constitutional:  Negative for activity change, appetite change, fatigue and fever.  HENT:  Negative for sore throat, trouble swallowing and voice change.   Respiratory:  Negative for shortness of breath.   Cardiovascular:  Negative for chest pain.  Gastrointestinal:  Negative for abdominal pain, diarrhea, nausea and vomiting.  Musculoskeletal:  Positive for myalgias. Negative for arthralgias.  Skin:  Positive for rash and wound. Negative for color change.  Neurological:  Negative for weakness and numbness.     Physical Exam Triage Vital Signs ED Triage Vitals [12/07/22 1947]  Enc Vitals Group     BP 132/86     Pulse Rate 73     Resp 18     Temp 98.6 F (37 C)     Temp Source Oral     SpO2 98 %     Weight      Height      Head Circumference      Peak Flow      Pain Score      Pain Loc      Pain Edu?      Excl. in [place]?    No data found.  Updated Vital Signs BP 132/86 (BP Location: Left Arm)   Pulse 73   Temp 98.6 F (37 C) (Oral)   Resp 18   LMP 12/07/2022   SpO2 98%   Visual Acuity Right Eye Distance:   Left Eye Distance:   Bilateral Distance:    Right  Eye Near:   Left Eye Near:    Bilateral Near:     Physical Exam Vitals reviewed.  Constitutional:      General: She is awake. She is not in acute distress.    Appearance: Normal appearance. She is well-developed. She is not ill-appearing.     Comments: Very pleasant female appears stated age in no acute distress sitting comfortably in exam room  HENT:     Head: Normocephalic and atraumatic.  Cardiovascular:     Rate and Rhythm: Normal rate and regular rhythm.     Heart sounds: Normal heart sounds, S1 normal and S2 normal. No murmur heard. Pulmonary:     Effort: Pulmonary effort is normal.     Breath sounds: Normal breath sounds. No wheezing, rhonchi or rales.     Comments: Clear to auscultation bilaterally Abdominal:     General: Bowel sounds are normal.     Palpations: Abdomen is soft.     Tenderness: There is no abdominal tenderness. There is no right CVA tenderness, left CVA tenderness, guarding or rebound.  Musculoskeletal:     Comments: Tenderness to palpation over right upper arm.  Normal pincer grip strength.  Decreased range of motion at right shoulder and upper arm secondary to pain.  Skin:    Findings: Wound present.     Comments: 0.5 cm ulcerated wound noted on the lateral right upper arm with surrounding urticaria.  None blanching petechial looking rash with associated urticaria noted spreading proximally.  Psychiatric:        Behavior: Behavior is cooperative.      UC Treatments / Results  Labs (all labs ordered are listed, but only abnormal results are displayed)  Labs Reviewed - No data to display  EKG   Radiology No results found.  Procedures Procedures (including critical care time)  Medications Ordered in UC Medications - No data to display  Initial Impression / Assessment and Plan / UC Course  I have reviewed the triage vital signs and the nursing notes.  Pertinent labs & imaging results that were available during my care of the patient were  reviewed by me and considered in my medical decision making (see chart for details).     Patient is well-appearing, afebrile, nontoxic, nontachycardic.  No indication for emergent evaluation or imaging.  Suspect allergic etiology given rapid progression of symptoms.  Patient was started on H1 and H2 blockade as well as prednisone taper.  She was instructed on take NSAIDs with prednisone due to risk of GI bleeding.  She is to keep area clean and apply Bactroban ointment twice daily with dressing changes.  Discussed that if her symptoms worsen or change in any way and she has spread of rash, shortness of breath, swelling of her throat, drainage, worsening wound she needs to be seen immediately.  Discussed that she should rest and avoid any strenuous activity for the next several days.  Provided work excuse note.  Recommend close follow-up with her primary care.  Strict return precautions given to which she expressed understanding.  Final Clinical Impressions(s) / UC Diagnoses   Final diagnoses:  Allergic reaction to insect bite  Hives     Discharge Instructions      I am unsure what bit you but it appears to be some sort of insect that you are having an allergic reaction to.  Please start cetirizine in the morning.  Take famotidine at night.  Start prednisone tomorrow (12/08/2021).  Do not take NSAIDs with this medication including aspirin, Profen/Advil, naproxen/Aleve.  Keep the area clean and apply Bactroban ointment.  If this changes or worsens in any way you need to be seen immediately.     ED Prescriptions     Medication Sig Dispense Auth. Provider   predniSONE (STERAPRED UNI-PAK 21 TAB) 10 MG (21) TBPK tablet As directed 21 tablet Romon Devereux K, PA-C   famotidine (PEPCID) 40 MG tablet Take 1 tablet (40 mg total) by mouth at bedtime. 30 tablet Vannie Hilgert K, PA-C   mupirocin ointment (BACTROBAN) 2 % Apply 1 Application topically 2 (two) times daily. 22 g Isaia Hassell K, PA-C    cetirizine (ZYRTEC ALLERGY) 10 MG tablet Take 1 tablet (10 mg total) by mouth daily. 30 tablet Jantzen Pilger, Derry Skill, PA-C      PDMP not reviewed this encounter.   Terrilee Croak, PA-C 12/07/22 2020

## 2022-12-07 NOTE — Discharge Instructions (Signed)
I am unsure what bit you but it appears to be some sort of insect that you are having an allergic reaction to.  Please start cetirizine in the morning.  Take famotidine at night.  Start prednisone tomorrow (12/08/2021).  Do not take NSAIDs with this medication including aspirin, Profen/Advil, naproxen/Aleve.  Keep the area clean and apply Bactroban ointment.  If this changes or worsens in any way you need to be seen immediately.

## 2022-12-07 NOTE — ED Triage Notes (Signed)
Pt states she has insect bite on her right arm. Pt reports this happened earlier today.

## 2022-12-10 ENCOUNTER — Telehealth: Payer: Medicaid Other | Admitting: Nurse Practitioner

## 2022-12-10 DIAGNOSIS — M7989 Other specified soft tissue disorders: Secondary | ICD-10-CM | POA: Diagnosis not present

## 2022-12-10 NOTE — Patient Instructions (Signed)
Because of your worsening symptoms, I feel your condition warrants further evaluation and I recommend that you be seen in a face to face visit.   NOTE: There will be NO CHARGE for this eVisit   If you are having a true medical emergency please call 911.      For an urgent face to face visit, [facility] has eight urgent care centers for your convenience:   NEW!! [facility] [place] [facility] [place] Get Driving Directions [phone] [address] [place], [place] [facility] [place] [facility] [place] Get Driving Directions [phone] [place], [place] [address]   [facility] [place] [facility]) Get Driving Directions [phone] [address], [place] [address]  [facility] [place] ([place]) Get Driving Directions [phone] [address] [place],  [place]  [address]  [facility] - at [address] Get Driving Directions  [phone] [address] [place],  [place] [address]   [facility] Get Driving Directions [phone] [address] [place], [place], [place] [address]   [facility] Get Driving Directions  [phone] [address] [place], [place] [address]   [facility] [place] Get Driving Directions [phone] [address] [place], [place] [address]  Your MyChart E-visit questionnaire answers were reviewed by a board certified advanced clinical practitioner to complete your personal care plan based on your specific symptoms.  Thank you for using e-Visits.

## 2022-12-10 NOTE — Progress Notes (Signed)
Virtual Visit Consent   Susanne Baumgarner, you are scheduled for a virtual visit with a [facility] provider today. Just as with appointments in the office, your consent must be obtained to participate. Your consent will be active for this visit and any virtual visit you may have with one of our providers in the next 365 days. If you have a MyChart account, a copy of this consent can be sent to you electronically.  As this is a virtual visit, video technology does not allow for your provider to perform a traditional examination. This may limit your provider's ability to fully assess your condition. If your provider identifies any concerns that need to be evaluated in person or the need to arrange testing (such as labs, EKG, etc.), we will make arrangements to do so. Although advances in technology are sophisticated, we cannot ensure that it will always work on either your end or our end. If the connection with a video visit is poor, the visit may have to be switched to a telephone visit. With either a video or telephone visit, we are not always able to ensure that we have a secure connection.  By engaging in this virtual visit, you consent to the provision of healthcare and authorize for your insurance to be billed (if applicable) for the services provided during this visit. Depending on your insurance coverage, you may receive a charge related to this service.  I need to obtain your verbal consent now. Are you willing to proceed with your visit today? Kyrstal Wanat has provided verbal consent on 12/10/2022 for a virtual visit (video or telephone). Apolonio Schneiders, FNP  Date: 12/10/2022 4:32 PM  Virtual Visit via Video Note   I, Apolonio Schneiders, connected with  Penda Venturi  ([id number], 1990/12/31) on 12/10/22 at  5:00 PM EST by a video-enabled telemedicine application and verified that I am speaking with the correct person using two identifiers.  Location: Patient: Virtual Visit Location Patient:  Home Provider: Virtual Visit Location Provider: Home Office   I discussed the limitations of evaluation and management by telemedicine and the availability of in person appointments. The patient expressed understanding and agreed to proceed.    History of Present Illness: Rebeckah Masih is a 32 y.o. who identifies as a female who was assigned female at birth, and is being seen today for follow up on an insect bite.    She was seen at [facility] 12/07/22 after suffering an insect bite to her right upper arm, unsure what bit her  She was given Zyrtec and Prednisone   She is now having numbness in arm and is having a hard time lifting her arm. She works as a Animal nutritionist at [employer]  The bite occurred while she was at work.    She is allergic to mosquitos   Problems:  Patient Active Problem List   Diagnosis Date Noted   Hydronephrosis of right kidney 01/27/2021   Nephrolithiasis 01/26/2021   Asthma 09/10/2017    Allergies:  Allergies  Allergen Reactions   Iohexol Hives, Shortness Of Breath, Itching, Nausea And Vomiting, Swelling and Other (See Comments)    Also caused chest pain PER DR BARRY, PRE MED PROTOCOL SHOULD BE ADMINISTERED PRIOR TO SCANS IN THE FUTURE.  PT HAD A SIGNIFICANT REACTION.  [facility]  08/18/17 Patient had a reaction of SOB, swelling even though premedicated. Physician states no further contrast imaging in the future.   Peanut-Containing Drug Products Anaphylaxis and Swelling    Facial swelling  Other Swelling    Tree nuts cause facial swelling   Sulfa Antibiotics Swelling    Facial swelling (reported by [facility] 2013)   Iodinated Contrast Media Rash   Medications:  Current Outpatient Medications:    acetaminophen (TYLENOL) 650 MG CR tablet, Take 1,300 mg by mouth every 8 (eight) hours as needed for pain., Disp: , Rfl:    albuterol (VENTOLIN HFA) 108 (90 Base) MCG/ACT inhaler, Inhale 2 puffs into the lungs every 6 (six) hours as needed for wheezing or  shortness of breath., Disp: , Rfl:    cetirizine (ZYRTEC ALLERGY) 10 MG tablet, Take 1 tablet (10 mg total) by mouth daily., Disp: 30 tablet, Rfl: 0   EPINEPHrine 0.3 mg/0.3 mL IJ SOAJ injection, Inject 0.3 mLs (0.3 mg total) into the muscle as needed for anaphylaxis., Disp: 2 each, Rfl: 3   Erenumab-aooe (AIMOVIG, 140 MG DOSE,) 70 MG/ML SOAJ, Inject 140 mg into the skin every 3 (three) months., Disp: , Rfl:    erythromycin ophthalmic ointment, Place a 1/2 inch ribbon of ointment into the lower eyelid., Disp: 3.5 g, Rfl: 0   famotidine (PEPCID) 40 MG tablet, Take 1 tablet (40 mg total) by mouth at bedtime., Disp: 30 tablet, Rfl: 1   Lidocaine 4 % PTCH, Apply 1 patch topically daily as needed (pain)., Disp: , Rfl:    medroxyPROGESTERone Acetate (DEPO-PROVERA IM), Inject 1 each into the muscle every 3 (three) months., Disp: , Rfl:    Melatonin 5 MG CHEW, Chew 5 mg by mouth at bedtime., Disp: , Rfl:    methocarbamol (ROBAXIN) 500 MG tablet, Take 1 tablet (500 mg total) by mouth every 6 (six) hours as needed for muscle spasms., Disp: 50 tablet, Rfl: 1   Multiple Vitamins-Minerals (HAIR/SKIN/NAILS/BIOTIN PO), Take 2 tablets by mouth daily., Disp: , Rfl:    mupirocin ointment (BACTROBAN) 2 %, Apply 1 Application topically 2 (two) times daily., Disp: 22 g, Rfl: 0   naproxen (NAPROSYN) 500 MG tablet, Take 500 mg by mouth 2 (two) times daily as needed for pain. Take with food., Disp: , Rfl:    olopatadine (PATADAY) 0.1 % ophthalmic solution, Place 1 drop into both eyes 2 (two) times daily., Disp: 5 mL, Rfl: 12   ondansetron (ZOFRAN) 4 MG tablet, Take 1 tablet (4 mg total) by mouth every 8 (eight) hours as needed for nausea or vomiting., Disp: 20 tablet, Rfl: 0   predniSONE (STERAPRED UNI-PAK 21 TAB) 10 MG (21) TBPK tablet, As directed, Disp: 21 tablet, Rfl: 0   promethazine (PHENERGAN) 25 MG tablet, Take 25 mg by mouth every 8 (eight) hours as needed for nausea or vomiting., Disp: , Rfl:    tiZANidine  (ZANAFLEX) 4 MG tablet, Take 1 tablet (4 mg total) by mouth at bedtime as needed for muscle spasms., Disp: 30 tablet, Rfl: 5  Observations/Objective: Patient is well-developed, well-nourished in no acute distress.  Resting comfortably at home.  Head is normocephalic, atraumatic.  No labored breathing.  Speech is clear and coherent with logical content.  Patient is alert and oriented at baseline.  Erythema to right upper arm extending beyond boundary lines that were drawn at previous apt. Center appears open without active drainage    Assessment and Plan: 1. Swelling of arm Advised follow up at [facility] or ED tonight for imaging of arm and possible blood work  Patient agrees to be seen in person tonight       Follow Up Instructions: I discussed the assessment and treatment plan  with the patient. The patient was provided an opportunity to ask questions and all were answered. The patient agreed with the plan and demonstrated an understanding of the instructions.  A copy of instructions were sent to the patient via MyChart unless otherwise noted below.    The patient was advised to call back or seek an in-person evaluation if the symptoms worsen or if the condition fails to improve as anticipated.  Time:  I spent 10 minutes with the patient via telehealth technology discussing the above problems/concerns.    Viviano Simas, FNP

## 2023-03-30 ENCOUNTER — Ambulatory Visit (HOSPITAL_COMMUNITY)
Admission: EM | Admit: 2023-03-30 | Discharge: 2023-03-30 | Disposition: A | Discharge status: Home / Self Care | Payer: Medicaid Other | Attending: Physician Assistant | Admitting: Physician Assistant

## 2023-03-30 ENCOUNTER — Encounter (HOSPITAL_COMMUNITY): Payer: Self-pay

## 2023-03-30 DIAGNOSIS — S025XXB Fracture of tooth (traumatic), initial encounter for open fracture: Secondary | ICD-10-CM

## 2023-03-30 DIAGNOSIS — K0889 Other specified disorders of teeth and supporting structures: Secondary | ICD-10-CM | POA: Diagnosis not present

## 2023-03-30 DIAGNOSIS — S025XXA Fracture of tooth (traumatic), initial encounter for closed fracture: Secondary | ICD-10-CM

## 2023-03-30 MED ORDER — AMOXICILLIN-POT CLAVULANATE 875-125 MG PO TABS: 1.0000 | ORAL_TABLET | Freq: Two times a day (BID) | ORAL | 0 refills | Status: DC

## 2023-03-30 MED ORDER — HYDROCODONE-ACETAMINOPHEN 5-325 MG PO TABS: 1.0000 | ORAL_TABLET | Freq: Four times a day (QID) | ORAL | 0 refills | Status: AC | PRN

## 2023-03-30 NOTE — ED Triage Notes (Signed)
Pt states that she cracked her back tooth on the right bottom side Tuesday. Unsure if top is. Ate rice that wasn't cooked through and broke her tooth. Took ibu at 7am

## 2023-03-30 NOTE — Discharge Instructions (Addendum)
You have a broken tooth.  You need to follow-up with a dentist immediately.  Please refer to the references provided and try to get an appointment on Monday.  Take the Augmentin as directed to help with preventing infection.  You can rinse with salt water gargles.  Soft foods only.  You can use the hydrocodone as directed for moderate or severe pain.  Do not drive with this medication and do not take Tylenol with this medicine.  You can take ibuprofen over-the-counter.  Present to the emergency department if severe pain, fever, pus, other acute change in symptoms.
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
## 2023-05-09 ENCOUNTER — Encounter (HOSPITAL_COMMUNITY): Payer: Self-pay | Admitting: *Deleted

## 2023-05-09 ENCOUNTER — Ambulatory Visit (HOSPITAL_COMMUNITY)
Admission: EM | Admit: 2023-05-09 | Discharge: 2023-05-09 | Disposition: A | Discharge status: Home / Self Care | Payer: Worker's Compensation

## 2023-05-09 DIAGNOSIS — S0081XA Abrasion of other part of head, initial encounter: Secondary | ICD-10-CM | POA: Diagnosis not present

## 2023-05-09 MED ORDER — ACETAMINOPHEN 325 MG PO TABS
ORAL_TABLET | ORAL | Status: AC
  Filled 2023-05-09: qty 3

## 2023-05-09 MED ORDER — ACETAMINOPHEN 325 MG PO TABS
975.0000 mg | ORAL_TABLET | Freq: Once | ORAL | Status: AC
  Administered 2023-05-09: 975 mg via ORAL

## 2023-05-09 MED ORDER — KETOROLAC TROMETHAMINE 30 MG/ML IJ SOLN
INTRAMUSCULAR | Status: AC
  Filled 2023-05-09: qty 1

## 2023-05-09 MED ORDER — KETOROLAC TROMETHAMINE 30 MG/ML IJ SOLN
30.0000 mg | Freq: Once | INTRAMUSCULAR | Status: AC
  Administered 2023-05-09: 30 mg via INTRAMUSCULAR

## 2023-05-09 NOTE — Discharge Instructions (Signed)
I gave you a shot of pain medicine in clinic called ketorolac which is a strong anti-inflammatory medication.  You may start taking ibuprofen tomorrow.  The wound on your face will heal over time.  If you start having worsening headache symptoms, dizziness, vision changes, or any other new or worsening symptoms in the next 12 to 24 hours, I would like for you to go to the nearest emergency department for further evaluation.  Otherwise, you may follow-up with your primary care provider as needed or return to urgent care.  I hope you feel better!
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
## 2023-05-13 ENCOUNTER — Ambulatory Visit (HOSPITAL_COMMUNITY)
Admission: EM | Admit: 2023-05-13 | Discharge: 2023-05-13 | Disposition: A | Discharge status: Home / Self Care | Payer: Medicaid Other

## 2023-05-13 ENCOUNTER — Encounter (HOSPITAL_COMMUNITY): Payer: Self-pay

## 2023-05-13 ENCOUNTER — Other Ambulatory Visit: Payer: Self-pay

## 2023-05-13 ENCOUNTER — Emergency Department (HOSPITAL_COMMUNITY)
Admission: EM | Admit: 2023-05-13 | Discharge: 2023-05-13 | Disposition: A | Discharge status: Home / Self Care | Payer: Medicaid Other | Attending: Emergency Medicine | Admitting: Emergency Medicine

## 2023-05-13 ENCOUNTER — Emergency Department (HOSPITAL_COMMUNITY): Payer: Medicaid Other

## 2023-05-13 DIAGNOSIS — S0990XD Unspecified injury of head, subsequent encounter: Secondary | ICD-10-CM | POA: Diagnosis not present

## 2023-05-13 DIAGNOSIS — R112 Nausea with vomiting, unspecified: Secondary | ICD-10-CM | POA: Diagnosis not present

## 2023-05-13 DIAGNOSIS — Z91010 Allergy to peanuts: Secondary | ICD-10-CM | POA: Insufficient documentation

## 2023-05-13 DIAGNOSIS — S060X0A Concussion without loss of consciousness, initial encounter: Secondary | ICD-10-CM | POA: Insufficient documentation

## 2023-05-13 DIAGNOSIS — S0990XA Unspecified injury of head, initial encounter: Secondary | ICD-10-CM | POA: Diagnosis present

## 2023-05-13 DIAGNOSIS — Y990 Civilian activity done for income or pay: Secondary | ICD-10-CM | POA: Insufficient documentation

## 2023-05-13 LAB — COMPREHENSIVE METABOLIC PANEL
ALT: 15 U/L (ref 0–44)
AST: 15 U/L (ref 15–41)
Albumin: 3.6 g/dL (ref 3.5–5.0)
Alkaline Phosphatase: 53 U/L (ref 38–126)
Anion gap: 9 (ref 5–15)
BUN: 7 mg/dL (ref 6–20)
CO2: 22 mmol/L (ref 22–32)
Calcium: 9.2 mg/dL (ref 8.9–10.3)
Chloride: 103 mmol/L (ref 98–111)
Creatinine, Ser: 0.64 mg/dL (ref 0.44–1.00)
GFR, Estimated: >60 mL/min (ref >60)
Glucose, Bld: 86 mg/dL (ref 70–99)
Potassium: 3.7 mmol/L (ref 3.5–5.1)
Sodium: 134 mmol/L — ABNORMAL LOW (ref 135–145)
Total Bilirubin: 0.3 mg/dL (ref 0.3–1.2)
Total Protein: 7 g/dL (ref 6.5–8.1)

## 2023-05-13 LAB — CBC
HCT: 37.2 % (ref 36.0–46.0)
Hemoglobin: 11.7 g/dL — ABNORMAL LOW (ref 12.0–15.0)
MCH: 25.9 pg — ABNORMAL LOW (ref 26.0–34.0)
MCHC: 31.5 g/dL (ref 30.0–36.0)
MCV: 82.5 fL (ref 80.0–100.0)
Platelets: 294 10*3/uL (ref 150–400)
RBC: 4.51 MIL/uL (ref 3.87–5.11)
RDW: 13.3 % (ref 11.5–15.5)
WBC: 6.5 10*3/uL (ref 4.0–10.5)
nRBC: 0 % (ref 0.0–0.2)

## 2023-05-13 MED ORDER — ONDANSETRON 4 MG PO TBDP: ORAL_TABLET | ORAL | 0 refills | Status: DC
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
## 2023-05-13 NOTE — Discharge Instructions (Signed)
Please go directly to ER for further care. I am worried you may be bleeding in your brain
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
## 2023-05-13 NOTE — ED Notes (Signed)
Patient is being discharged from the Urgent Care and sent to the Emergency Department via POV . Per Marylene Land, FP, patient is in need of higher level of care due to head trauma. Patient is aware and verbalizes understanding of plan of care.  Vitals:   05/13/23 1442  BP: 120/78  Pulse: 64  Resp: 18  Temp: 98.6 F (37 C)  SpO2: 96%
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
## 2023-05-21 ENCOUNTER — Other Ambulatory Visit: Payer: Self-pay

## 2023-05-21 ENCOUNTER — Emergency Department (HOSPITAL_COMMUNITY)
Admission: EM | Admit: 2023-05-21 | Discharge: 2023-05-22 | Disposition: A | Discharge status: Home / Self Care | Payer: Medicaid Other | Attending: Emergency Medicine | Admitting: Emergency Medicine

## 2023-05-21 DIAGNOSIS — F0781 Postconcussional syndrome: Secondary | ICD-10-CM | POA: Diagnosis not present

## 2023-05-21 DIAGNOSIS — O26891 Other specified pregnancy related conditions, first trimester: Secondary | ICD-10-CM | POA: Diagnosis not present

## 2023-05-21 DIAGNOSIS — Z91010 Allergy to peanuts: Secondary | ICD-10-CM | POA: Insufficient documentation

## 2023-05-21 DIAGNOSIS — Z3201 Encounter for pregnancy test, result positive: Secondary | ICD-10-CM | POA: Insufficient documentation

## 2023-05-21 DIAGNOSIS — R112 Nausea with vomiting, unspecified: Secondary | ICD-10-CM

## 2023-05-21 DIAGNOSIS — O219 Vomiting of pregnancy, unspecified: Secondary | ICD-10-CM | POA: Diagnosis present

## 2023-05-21 DIAGNOSIS — Z3A01 Less than 8 weeks gestation of pregnancy: Secondary | ICD-10-CM | POA: Insufficient documentation

## 2023-05-21 DIAGNOSIS — R519 Headache, unspecified: Secondary | ICD-10-CM | POA: Diagnosis not present

## 2023-05-21 LAB — CBC
HCT: 39.3 % (ref 36.0–46.0)
Hemoglobin: 12.6 g/dL (ref 12.0–15.0)
MCH: 26 pg (ref 26.0–34.0)
MCHC: 32.1 g/dL (ref 30.0–36.0)
MCV: 81.2 fL (ref 80.0–100.0)
Platelets: 341 10*3/uL (ref 150–400)
RBC: 4.84 MIL/uL (ref 3.87–5.11)
RDW: 13.2 % (ref 11.5–15.5)
WBC: 10.3 10*3/uL (ref 4.0–10.5)
nRBC: 0 % (ref 0.0–0.2)

## 2023-05-21 LAB — BASIC METABOLIC PANEL
Anion gap: 15 (ref 5–15)
BUN: 9 mg/dL (ref 6–20)
CO2: 23 mmol/L (ref 22–32)
Calcium: 9.6 mg/dL (ref 8.9–10.3)
Chloride: 102 mmol/L (ref 98–111)
Creatinine, Ser: 0.78 mg/dL (ref 0.44–1.00)
GFR, Estimated: >60 mL/min (ref >60)
Glucose, Bld: 91 mg/dL (ref 70–99)
Potassium: 4 mmol/L (ref 3.5–5.1)
Sodium: 140 mmol/L (ref 135–145)

## 2023-05-21 LAB — TROPONIN I (HIGH SENSITIVITY): Troponin I (High Sensitivity): 3 ng/L (ref <18)

## 2023-05-21 NOTE — ED Triage Notes (Signed)
Pt presents crying reporting chest pain, headache, and abdominal pain with vomiting since 6/13.  Pt states she has been seen at [facility] and here for same.  States she is getting worse.

## 2023-05-22 ENCOUNTER — Ambulatory Visit: Payer: Medicaid Other | Admitting: Sports Medicine

## 2023-05-22 ENCOUNTER — Emergency Department (HOSPITAL_COMMUNITY): Payer: Medicaid Other

## 2023-05-22 ENCOUNTER — Encounter (HOSPITAL_COMMUNITY): Payer: Self-pay

## 2023-05-22 VITALS — BP 122/80 | Ht 62.0 in

## 2023-05-22 DIAGNOSIS — O219 Vomiting of pregnancy, unspecified: Secondary | ICD-10-CM

## 2023-05-22 DIAGNOSIS — Z3A01 Less than 8 weeks gestation of pregnancy: Secondary | ICD-10-CM | POA: Diagnosis not present

## 2023-05-22 LAB — TROPONIN I (HIGH SENSITIVITY): Troponin I (High Sensitivity): 2 ng/L (ref <18)

## 2023-05-22 LAB — PREGNANCY, URINE: Preg Test, Ur: POSITIVE — AB

## 2023-05-22 LAB — HCG, QUANTITATIVE, PREGNANCY: hCG, Beta Chain, Quant, S: 137798 m[IU]/mL — ABNORMAL HIGH (ref <5)

## 2023-05-22 LAB — POC URINE PREG, ED: Preg Test, Ur: POSITIVE — AB

## 2023-05-22 MED ORDER — MAGNESIUM SULFATE 2 GM/50ML IV SOLN
2.0000 g | Freq: Once | INTRAVENOUS | Status: AC
  Administered 2023-05-22: 2 g via INTRAVENOUS
  Filled 2023-05-22: qty 50

## 2023-05-22 MED ORDER — PRENATAL VITAMIN 27-0.8 MG PO TABS: 1.0000 | ORAL_TABLET | Freq: Every day | ORAL | 0 refills | Status: DC

## 2023-05-22 MED ORDER — ONDANSETRON 4 MG PO TBDP
8.0000 mg | ORAL_TABLET | Freq: Once | ORAL | Status: AC
  Administered 2023-05-22: 8 mg via ORAL
  Filled 2023-05-22: qty 2

## 2023-05-22 MED ORDER — SUCRALFATE 1 GM/10ML PO SUSP: 1.0000 g | Freq: Three times a day (TID) | ORAL | 0 refills | Status: DC

## 2023-05-22 MED ORDER — SODIUM CHLORIDE 0.9 % IV BOLUS
500.0000 mL | Freq: Once | INTRAVENOUS | Status: AC
  Administered 2023-05-22: 500 mL via INTRAVENOUS

## 2023-05-22 MED ORDER — ONDANSETRON 8 MG PO TBDP: ORAL_TABLET | ORAL | 0 refills | Status: DC
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
## 2023-05-24 ENCOUNTER — Emergency Department (HOSPITAL_COMMUNITY)
Admission: EM | Admit: 2023-05-24 | Discharge: 2023-05-24 | Disposition: A | Discharge status: Home / Self Care | Payer: Medicaid Other | Attending: Emergency Medicine | Admitting: Emergency Medicine

## 2023-05-24 ENCOUNTER — Other Ambulatory Visit: Payer: Self-pay

## 2023-05-24 ENCOUNTER — Encounter (HOSPITAL_COMMUNITY): Payer: Self-pay

## 2023-05-24 DIAGNOSIS — Z91010 Allergy to peanuts: Secondary | ICD-10-CM | POA: Insufficient documentation

## 2023-05-24 DIAGNOSIS — R112 Nausea with vomiting, unspecified: Secondary | ICD-10-CM | POA: Insufficient documentation

## 2023-05-24 DIAGNOSIS — R519 Headache, unspecified: Secondary | ICD-10-CM | POA: Diagnosis present

## 2023-05-24 LAB — URINALYSIS, ROUTINE W REFLEX MICROSCOPIC
Bilirubin Urine: NEGATIVE
Glucose, UA: NEGATIVE mg/dL
Hgb urine dipstick: NEGATIVE
Ketones, ur: 80 mg/dL — AB
Nitrite: NEGATIVE
Protein, ur: 100 mg/dL — AB
Specific Gravity, Urine: 1.025 (ref 1.005–1.030)
pH: 6 (ref 5.0–8.0)

## 2023-05-24 LAB — COMPREHENSIVE METABOLIC PANEL
ALT: 23 U/L (ref 0–44)
AST: 23 U/L (ref 15–41)
Albumin: 4 g/dL (ref 3.5–5.0)
Alkaline Phosphatase: 59 U/L (ref 38–126)
Anion gap: 9 (ref 5–15)
BUN: 13 mg/dL (ref 6–20)
CO2: 24 mmol/L (ref 22–32)
Calcium: 9.4 mg/dL (ref 8.9–10.3)
Chloride: 104 mmol/L (ref 98–111)
Creatinine, Ser: 0.75 mg/dL (ref 0.44–1.00)
GFR, Estimated: >60 mL/min (ref >60)
Glucose, Bld: 93 mg/dL (ref 70–99)
Potassium: 3.2 mmol/L — ABNORMAL LOW (ref 3.5–5.1)
Sodium: 137 mmol/L (ref 135–145)
Total Bilirubin: 1.1 mg/dL (ref 0.3–1.2)
Total Protein: 8.2 g/dL — ABNORMAL HIGH (ref 6.5–8.1)

## 2023-05-24 LAB — CBC WITH DIFFERENTIAL/PLATELET
Abs Immature Granulocytes: 0.02 10*3/uL (ref 0.00–0.07)
Basophils Absolute: 0 10*3/uL (ref 0.0–0.1)
Basophils Relative: 1 %
Eosinophils Absolute: 0.1 10*3/uL (ref 0.0–0.5)
Eosinophils Relative: 1 %
HCT: 38.4 % (ref 36.0–46.0)
Hemoglobin: 12.2 g/dL (ref 12.0–15.0)
Immature Granulocytes: 0 %
Lymphocytes Relative: 17 %
Lymphs Abs: 1.3 10*3/uL (ref 0.7–4.0)
MCH: 26.1 pg (ref 26.0–34.0)
MCHC: 31.8 g/dL (ref 30.0–36.0)
MCV: 82.1 fL (ref 80.0–100.0)
Monocytes Absolute: 0.5 10*3/uL (ref 0.1–1.0)
Monocytes Relative: 7 %
Neutro Abs: 5.5 10*3/uL (ref 1.7–7.7)
Neutrophils Relative %: 74 %
Platelets: 279 10*3/uL (ref 150–400)
RBC: 4.68 MIL/uL (ref 3.87–5.11)
RDW: 13.2 % (ref 11.5–15.5)
WBC: 7.4 10*3/uL (ref 4.0–10.5)
nRBC: 0 % (ref 0.0–0.2)

## 2023-05-24 LAB — LIPASE, BLOOD: Lipase: 28 U/L (ref 11–51)

## 2023-05-24 LAB — TROPONIN I (HIGH SENSITIVITY): Troponin I (High Sensitivity): 2 ng/L (ref <18)

## 2023-05-24 MED ORDER — FAMOTIDINE IN NACL 20-0.9 MG/50ML-% IV SOLN
20.0000 mg | Freq: Once | INTRAVENOUS | Status: AC
  Administered 2023-05-24: 20 mg via INTRAVENOUS
  Filled 2023-05-24: qty 50

## 2023-05-24 MED ORDER — METOCLOPRAMIDE HCL 5 MG/ML IJ SOLN
5.0000 mg | Freq: Once | INTRAMUSCULAR | Status: AC
  Administered 2023-05-24: 5 mg via INTRAVENOUS
  Filled 2023-05-24: qty 2

## 2023-05-24 MED ORDER — OMEPRAZOLE 20 MG PO CPDR: 20.0000 mg | DELAYED_RELEASE_CAPSULE | Freq: Every day | ORAL | 0 refills | Status: DC

## 2023-05-24 MED ORDER — SODIUM CHLORIDE 0.9 % IV BOLUS
1000.0000 mL | Freq: Once | INTRAVENOUS | Status: AC
  Administered 2023-05-24: 1000 mL via INTRAVENOUS

## 2023-05-24 NOTE — ED Triage Notes (Addendum)
Pt BIB EMS from home c/o headache, vomiting and chest pain since 06/13. Pt states found out she is [redacted] weeks pregnant a few days ago

## 2023-05-24 NOTE — Discharge Instructions (Addendum)
You have been seen and discharged from the emergency department.  Your blood work was normal and reassuring.  You were treated with IV medication.  An ambulatory referral has been placed for you to see gastroenterology in hopes of repeat endoscopy.  If you do not hear from them in 5 days please call their office.  Take new medication for reflux as prescribed.  If you decide to continue with the pregnancy you need to see your OB/GYN and review the medications that were given at this visit and discontinue this prescription.  Follow-up with your primary provider for further evaluation and further care. Take home medications as prescribed. If you have any worsening symptoms or further concerns for your health please return to an emergency department for further evaluation.
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
## 2023-05-27 ENCOUNTER — Other Ambulatory Visit: Payer: Self-pay

## 2023-05-27 ENCOUNTER — Encounter (HOSPITAL_COMMUNITY): Payer: Self-pay

## 2023-05-27 ENCOUNTER — Inpatient Hospital Stay (HOSPITAL_COMMUNITY)
Admission: EM | Admit: 2023-05-27 | Discharge: 2023-05-29 | DRG: 833 | Disposition: A | Discharge status: Home / Self Care | Payer: Medicaid Other | Attending: Obstetrics and Gynecology | Admitting: Obstetrics and Gynecology

## 2023-05-27 ENCOUNTER — Inpatient Hospital Stay (HOSPITAL_COMMUNITY): Payer: Medicaid Other

## 2023-05-27 DIAGNOSIS — Z8249 Family history of ischemic heart disease and other diseases of the circulatory system: Secondary | ICD-10-CM | POA: Diagnosis not present

## 2023-05-27 DIAGNOSIS — R112 Nausea with vomiting, unspecified: Principal | ICD-10-CM

## 2023-05-27 DIAGNOSIS — O211 Hyperemesis gravidarum with metabolic disturbance: Principal | ICD-10-CM | POA: Diagnosis present

## 2023-05-27 DIAGNOSIS — E876 Hypokalemia: Secondary | ICD-10-CM | POA: Diagnosis not present

## 2023-05-27 DIAGNOSIS — Z3491 Encounter for supervision of normal pregnancy, unspecified, first trimester: Secondary | ICD-10-CM

## 2023-05-27 DIAGNOSIS — Z87891 Personal history of nicotine dependence: Secondary | ICD-10-CM | POA: Diagnosis not present

## 2023-05-27 DIAGNOSIS — R1110 Vomiting, unspecified: Secondary | ICD-10-CM

## 2023-05-27 DIAGNOSIS — O210 Mild hyperemesis gravidarum: Secondary | ICD-10-CM | POA: Diagnosis present

## 2023-05-27 DIAGNOSIS — Z3A13 13 weeks gestation of pregnancy: Secondary | ICD-10-CM | POA: Diagnosis not present

## 2023-05-27 DIAGNOSIS — Z3A12 12 weeks gestation of pregnancy: Secondary | ICD-10-CM | POA: Diagnosis not present

## 2023-05-27 DIAGNOSIS — R1010 Upper abdominal pain, unspecified: Secondary | ICD-10-CM

## 2023-05-27 DIAGNOSIS — E860 Dehydration: Secondary | ICD-10-CM

## 2023-05-27 LAB — URINALYSIS, ROUTINE W REFLEX MICROSCOPIC
Bacteria, UA: NONE SEEN
Bilirubin Urine: NEGATIVE
Glucose, UA: NEGATIVE mg/dL
Hgb urine dipstick: NEGATIVE
Ketones, ur: 80 mg/dL — AB
Leukocytes,Ua: NEGATIVE
Nitrite: NEGATIVE
Protein, ur: 100 mg/dL — AB
Specific Gravity, Urine: 1.024 (ref 1.005–1.030)
pH: 6 (ref 5.0–8.0)

## 2023-05-27 LAB — COMPREHENSIVE METABOLIC PANEL
ALT: 33 U/L (ref 0–44)
AST: 27 U/L (ref 15–41)
Albumin: 4 g/dL (ref 3.5–5.0)
Alkaline Phosphatase: 62 U/L (ref 38–126)
Anion gap: 13 (ref 5–15)
BUN: 12 mg/dL (ref 6–20)
CO2: 21 mmol/L — ABNORMAL LOW (ref 22–32)
Calcium: 9.8 mg/dL (ref 8.9–10.3)
Chloride: 100 mmol/L (ref 98–111)
Creatinine, Ser: 0.68 mg/dL (ref 0.44–1.00)
GFR, Estimated: >60 mL/min (ref >60)
Glucose, Bld: 103 mg/dL — ABNORMAL HIGH (ref 70–99)
Potassium: 2.9 mmol/L — ABNORMAL LOW (ref 3.5–5.1)
Sodium: 134 mmol/L — ABNORMAL LOW (ref 135–145)
Total Bilirubin: 1.7 mg/dL — ABNORMAL HIGH (ref 0.3–1.2)
Total Protein: 8.2 g/dL — ABNORMAL HIGH (ref 6.5–8.1)

## 2023-05-27 LAB — CBC
HCT: 42.5 % (ref 36.0–46.0)
Hemoglobin: 14 g/dL (ref 12.0–15.0)
MCH: 26.3 pg (ref 26.0–34.0)
MCHC: 32.9 g/dL (ref 30.0–36.0)
MCV: 79.9 fL — ABNORMAL LOW (ref 80.0–100.0)
Platelets: 350 10*3/uL (ref 150–400)
RBC: 5.32 MIL/uL — ABNORMAL HIGH (ref 3.87–5.11)
RDW: 12.9 % (ref 11.5–15.5)
WBC: 10.4 10*3/uL (ref 4.0–10.5)
nRBC: 0 % (ref 0.0–0.2)

## 2023-05-27 LAB — RAPID URINE DRUG SCREEN, HOSP PERFORMED
Amphetamines: NOT DETECTED
Barbiturates: NOT DETECTED
Benzodiazepines: NOT DETECTED
Cocaine: NOT DETECTED
Opiates: POSITIVE — AB
Tetrahydrocannabinol: NOT DETECTED

## 2023-05-27 LAB — MAGNESIUM: Magnesium: 2.1 mg/dL (ref 1.7–2.4)

## 2023-05-27 LAB — LIPASE, BLOOD: Lipase: 43 U/L (ref 11–51)

## 2023-05-27 LAB — TROPONIN I (HIGH SENSITIVITY): Troponin I (High Sensitivity): 3 ng/L (ref <18)

## 2023-05-27 LAB — HCG, QUANTITATIVE, PREGNANCY: hCG, Beta Chain, Quant, S: 142124 m[IU]/mL — ABNORMAL HIGH (ref <5)

## 2023-05-27 LAB — TSH: TSH: 0.038 u[IU]/mL — ABNORMAL LOW (ref 0.350–4.500)

## 2023-05-27 MED ORDER — ZOLPIDEM TARTRATE 5 MG PO TABS: 5.0000 mg | ORAL_TABLET | Freq: Every evening | ORAL | Status: DC | PRN

## 2023-05-27 MED ORDER — FAMOTIDINE 20 MG PO TABS
20.0000 mg | ORAL_TABLET | Freq: Once | ORAL | Status: AC
  Administered 2023-05-27: 20 mg via ORAL
  Filled 2023-05-27: qty 1

## 2023-05-27 MED ORDER — ALUM & MAG HYDROXIDE-SIMETH 200-200-20 MG/5ML PO SUSP
30.0000 mL | Freq: Once | ORAL | Status: AC
  Administered 2023-05-27: 30 mL via ORAL
  Filled 2023-05-27: qty 30

## 2023-05-27 MED ORDER — POTASSIUM CHLORIDE 10 MEQ/100ML IV SOLN
10.0000 meq | INTRAVENOUS | Status: AC
  Administered 2023-05-27 (×2): 10 meq via INTRAVENOUS
  Filled 2023-05-27 (×2): qty 100

## 2023-05-27 MED ORDER — ONDANSETRON 4 MG PO TBDP: 4.0000 mg | ORAL_TABLET | Freq: Three times a day (TID) | ORAL | Status: DC | PRN

## 2023-05-27 MED ORDER — LACTATED RINGERS IV BOLUS
1000.0000 mL | Freq: Once | INTRAVENOUS | Status: AC
  Administered 2023-05-27: 1000 mL via INTRAVENOUS

## 2023-05-27 MED ORDER — ALBUTEROL SULFATE (2.5 MG/3ML) 0.083% IN NEBU: 3.0000 mL | INHALATION_SOLUTION | Freq: Four times a day (QID) | RESPIRATORY_TRACT | Status: DC | PRN

## 2023-05-27 MED ORDER — METHOCARBAMOL 1000 MG/10ML IJ SOLN: 1000.0000 mg | Freq: Three times a day (TID) | INTRAMUSCULAR | Status: DC | PRN

## 2023-05-27 MED ORDER — FOLIC ACID 5 MG/ML IJ SOLN
1.0000 mg | Freq: Every day | INTRAMUSCULAR | Status: DC
  Administered 2023-05-27: 1 mg via INTRAVENOUS
  Filled 2023-05-27 (×3): qty 0.2

## 2023-05-27 MED ORDER — FAMOTIDINE IN NACL 20-0.9 MG/50ML-% IV SOLN
20.0000 mg | Freq: Two times a day (BID) | INTRAVENOUS | Status: DC
  Administered 2023-05-27 – 2023-05-29 (×4): 20 mg via INTRAVENOUS
  Filled 2023-05-27 (×4): qty 50

## 2023-05-27 MED ORDER — POTASSIUM CHLORIDE CRYS ER 20 MEQ PO TBCR: EXTENDED_RELEASE_TABLET | ORAL | 0 refills | Status: DC

## 2023-05-27 MED ORDER — METOCLOPRAMIDE HCL 5 MG/ML IJ SOLN
10.0000 mg | Freq: Four times a day (QID) | INTRAMUSCULAR | Status: DC
  Administered 2023-05-27 – 2023-05-29 (×7): 10 mg via INTRAVENOUS
  Filled 2023-05-27 (×7): qty 2

## 2023-05-27 MED ORDER — HYDROXYZINE HCL 50 MG/ML IM SOLN: 50.0000 mg | Freq: Four times a day (QID) | INTRAMUSCULAR | Status: DC | PRN

## 2023-05-27 MED ORDER — ACETAMINOPHEN 500 MG PO TABS
1000.0000 mg | ORAL_TABLET | Freq: Once | ORAL | Status: AC
  Administered 2023-05-27: 1000 mg via ORAL
  Filled 2023-05-27: qty 2

## 2023-05-27 MED ORDER — PROMETHAZINE HCL 12.5 MG RE SUPP: 12.5000 mg | RECTAL | Status: DC | PRN

## 2023-05-27 MED ORDER — ONDANSETRON HCL 4 MG/2ML IJ SOLN
4.0000 mg | Freq: Once | INTRAMUSCULAR | Status: AC
  Administered 2023-05-27: 4 mg via INTRAVENOUS
  Filled 2023-05-27: qty 2

## 2023-05-27 MED ORDER — LORAZEPAM 2 MG/ML IJ SOLN
1.0000 mg | Freq: Four times a day (QID) | INTRAMUSCULAR | Status: DC | PRN
  Filled 2023-05-27: qty 0.5

## 2023-05-27 MED ORDER — MORPHINE SULFATE (PF) 4 MG/ML IV SOLN
4.0000 mg | Freq: Once | INTRAVENOUS | Status: AC
  Administered 2023-05-27: 4 mg via INTRAVENOUS
  Filled 2023-05-27: qty 1

## 2023-05-27 MED ORDER — PRENATAL MULTIVITAMIN CH: 1.0000 | ORAL_TABLET | Freq: Every day | ORAL | Status: DC

## 2023-05-27 MED ORDER — ACETAMINOPHEN 500 MG PO TABS: 1000.0000 mg | ORAL_TABLET | Freq: Four times a day (QID) | ORAL | Status: DC | PRN

## 2023-05-27 MED ORDER — THIAMINE HCL 100 MG/ML IJ SOLN
Freq: Once | INTRAVENOUS | Status: AC
  Filled 2023-05-27: qty 1000

## 2023-05-27 MED ORDER — PROMETHAZINE HCL 25 MG PO TABS: 12.5000 mg | ORAL_TABLET | ORAL | Status: DC | PRN

## 2023-05-27 MED ORDER — CALCIUM CARBONATE ANTACID 500 MG PO CHEW: 2.0000 | CHEWABLE_TABLET | ORAL | Status: DC | PRN

## 2023-05-27 MED ORDER — SUCRALFATE 1 GM/10ML PO SUSP
1.0000 g | Freq: Three times a day (TID) | ORAL | Status: DC
  Filled 2023-05-27 (×10): qty 10

## 2023-05-27 MED ORDER — HYDROXYZINE HCL 25 MG PO TABS: 50.0000 mg | ORAL_TABLET | Freq: Four times a day (QID) | ORAL | Status: DC | PRN

## 2023-05-27 MED ORDER — FOLIC ACID 1 MG PO TABS
1.0000 mg | ORAL_TABLET | Freq: Every day | ORAL | Status: DC
  Administered 2023-05-29: 1 mg via ORAL
  Filled 2023-05-27 (×2): qty 1

## 2023-05-27 MED ORDER — ONDANSETRON HCL 4 MG/2ML IJ SOLN
4.0000 mg | Freq: Three times a day (TID) | INTRAMUSCULAR | Status: DC | PRN
  Administered 2023-05-28: 4 mg via INTRAVENOUS
  Filled 2023-05-27 (×2): qty 2

## 2023-05-27 MED ORDER — MAGNESIUM SULFATE IN D5W 1-5 GM/100ML-% IV SOLN
1.0000 g | Freq: Once | INTRAVENOUS | Status: AC
  Administered 2023-05-27: 1 g via INTRAVENOUS
  Filled 2023-05-27: qty 100

## 2023-05-27 MED ORDER — METOCLOPRAMIDE HCL 10 MG PO TABS: 10.0000 mg | ORAL_TABLET | Freq: Four times a day (QID) | ORAL | Status: DC

## 2023-05-27 MED ORDER — POTASSIUM CHLORIDE 10 MEQ/100ML IV SOLN
10.0000 meq | INTRAVENOUS | Status: DC
  Filled 2023-05-27: qty 100

## 2023-05-27 MED ORDER — POTASSIUM CHLORIDE 10 MEQ/100ML IV SOLN
10.0000 meq | INTRAVENOUS | Status: AC
  Administered 2023-05-28 (×4): 10 meq via INTRAVENOUS
  Filled 2023-05-27 (×3): qty 100

## 2023-05-27 MED ORDER — HYDROMORPHONE HCL 1 MG/ML IJ SOLN
1.0000 mg | INTRAMUSCULAR | Status: DC | PRN
  Administered 2023-05-28 (×2): 1 mg via INTRAVENOUS
  Filled 2023-05-27 (×2): qty 1

## 2023-05-27 MED ORDER — DOCUSATE SODIUM 100 MG PO CAPS
100.0000 mg | ORAL_CAPSULE | Freq: Every day | ORAL | Status: DC
  Administered 2023-05-29: 100 mg via ORAL
  Filled 2023-05-27 (×2): qty 1

## 2023-05-27 MED ORDER — SCOPOLAMINE 1 MG/3DAYS TD PT72
1.0000 | MEDICATED_PATCH | TRANSDERMAL | Status: DC
  Administered 2023-05-27: 1.5 mg via TRANSDERMAL
  Filled 2023-05-27: qty 1

## 2023-05-27 MED ORDER — LACTATED RINGERS IV SOLN: INTRAVENOUS | Status: DC

## 2023-05-27 MED ORDER — SODIUM CHLORIDE 0.9 % IV SOLN
8.0000 mg | Freq: Three times a day (TID) | INTRAVENOUS | Status: DC | PRN
  Administered 2023-05-28 – 2023-05-29 (×2): 8 mg via INTRAVENOUS
  Filled 2023-05-27 (×2): qty 4

## 2023-05-27 NOTE — ED Notes (Signed)
ED TO INPATIENT HANDOFF REPORT  ED Nurse Name and Phone #: Lillia Abed [phone]  S Name/Age/Gender [name] [name] 32 y.o. female Room/Bed: WA01/WA01  Code Status   Code Status: Prior  Home/SNF/Other Home Patient oriented to: self, place, time, and situation Is this baseline? Yes   Triage Complete: Triage complete  Chief Complaint Uncontrollable nausea and vomiting [R11.2]  Triage Note BIBA. Pt reports N/V. Has been seen multiple times in the last few weeks with same complaints. Pt also pregnant, no prenatal care reported. No other symptoms.    Allergies Allergies  Allergen Reactions   Iohexol Hives, Shortness Of Breath, Itching, Nausea And Vomiting, Swelling and Other (See Comments)    Also caused chest pain PER DR BARRY, PRE MED PROTOCOL SHOULD BE ADMINISTERED PRIOR TO SCANS IN THE FUTURE.  PT HAD A SIGNIFICANT REACTION.  [facility]  08/18/17 Patient had a reaction of SOB, swelling even though premedicated. Physician states no further contrast imaging in the future.   Peanut-Containing Drug Products Anaphylaxis and Swelling    Facial swelling   Other Swelling    Tree nuts cause facial swelling   Sulfa Antibiotics Swelling    Facial swelling (reported by [facility] 2013)   Iodinated Contrast Media Rash    Level of Care/Admitting Diagnosis ED Disposition     ED Disposition  Admit   Condition  --   Comment  Hospital Area: [facility] [[id number]]  Level of Care: Med-Surg [16]  May place patient in observation at [facility] or [facility] if equivalent level of care is available:: No  Covid Evaluation: Asymptomatic - no recent exposure (last 10 days) testing not required  Diagnosis: Uncontrollable nausea and vomiting [720113]  Admitting Physician: Samara Snide  Attending Physician: Samara Snide          B Medical/Surgery History 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   Ankle injury 2021    housekeeping cart hit ankle   Asthma exercise induced   Depression    Headache(784.0)    MIGRAINES   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   Nephrolithiasis 01/26/2021   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   Sprain of MCL (medial collateral ligament) of knee 10/12/2021   Past Surgical History:  Procedure Laterality Date   BIOPSY  05/27/2018   Procedure: BIOPSY;  Surgeon: Graylin Shiver, MD;  Location: [facility] ENDOSCOPY;  Service: Endoscopy;;   ESOPHAGOGASTRODUODENOSCOPY (EGD) WITH PROPOFOL N/A 09/11/2017   Procedure: ESOPHAGOGASTRODUODENOSCOPY (EGD) WITH PROPOFOL;  Surgeon: Charlott Rakes, MD;  Location: [facility] ENDOSCOPY;  Service: Endoscopy;  Laterality: N/A;   ESOPHAGOGASTRODUODENOSCOPY (EGD) WITH PROPOFOL N/A 02/20/2018   Procedure: ESOPHAGOGASTRODUODENOSCOPY (EGD) WITH PROPOFOL;  Surgeon: Rachael Fee, MD;  Location: [facility] ENDOSCOPY;  Service: Endoscopy;  Laterality: N/A;   ESOPHAGOGASTRODUODENOSCOPY (EGD) WITH PROPOFOL N/A 05/27/2018   Procedure: ESOPHAGOGASTRODUODENOSCOPY (EGD) WITH PROPOFOL WITH CLIP PLACEMENT;  Surgeon: Graylin Shiver, MD;  Location: [facility] ENDOSCOPY;  Service: Endoscopy;  Laterality: N/A;   INSERTION OF MESH N/A 12/26/2016   Procedure: INSERTION OF MESH;  Surgeon: Abigail Miyamoto, MD;  Location: [facility] ORS;  Service: General;  Laterality: N/A;   KNEE ARTHROSCOPY WITH MEDIAL PATELLAR FEMORAL LIGAMENT RECONSTRUCTION Left 03/09/2022   Procedure: LEFT KNEE ARTHROSCOPY WITH MEDIAL PATELLAR FEMORAL LIGAMENT RECONSTRUCTION WILL ALLOGRAFT;  Surgeon: Yolonda Kida, MD;  Location: [facility] ORS;  Service: Orthopedics;  Laterality: Left;   LAPAROSCOPY N/A 12/26/2016   Procedure: LAPAROSCOPY DIAGNOSTIC;  Surgeon: Abigail Miyamoto, MD;  Location:  [facility] ORS;  Service: General;  Laterality: N/A;   UMBILICAL HERNIA REPAIR N/A 12/26/2016   Procedure: HERNIA REPAIR UMBILICAL ADULT;  Surgeon: Abigail Miyamoto, MD;  Location: [facility] ORS;  Service: General;  Laterality: N/A;     A IV  Location/Drains/Wounds Patient Lines/Drains/Airways Status     Active Line/Drains/Airways     Name Placement date Placement time Site Days   Peripheral IV 05/27/23 20 G Anterior;Proximal;Right Forearm 05/27/23  1220  Forearm  less than 1            Intake/Output Last 24 hours  Intake/Output Summary (Last 24 hours) at 05/27/2023 1929 Last data filed at 05/27/2023 1841 Gross per 24 hour  Intake 2200 ml  Output --  Net 2200 ml    Labs/Imaging Results for orders placed or performed during the hospital encounter of 05/27/23 (from the past 48 hour(s))  CBC     Status: Abnormal   Collection Time: 05/27/23 12:00 PM  Result Value Ref Range   WBC 10.4 4.0 - 10.5 K/uL   RBC 5.32 (H) 3.87 - 5.11 MIL/uL   Hemoglobin 14.0 12.0 - 15.0 g/dL   HCT 16.1 09.6 - 04.5 %   MCV 79.9 (L) 80.0 - 100.0 fL   MCH 26.3 26.0 - 34.0 pg   MCHC 32.9 30.0 - 36.0 g/dL   RDW 40.9 81.1 - 91.4 %   Platelets 350 150 - 400 K/uL   nRBC 0.0 0.0 - 0.2 %    Comment: Performed at [facility], [address], [place], [place] [address]  Comprehensive metabolic panel     Status: Abnormal   Collection Time: 05/27/23 12:00 PM  Result Value Ref Range   Sodium 134 (L) 135 - 145 mmol/L   Potassium 2.9 (L) 3.5 - 5.1 mmol/L   Chloride 100 98 - 111 mmol/L   CO2 21 (L) 22 - 32 mmol/L   Glucose, Bld 103 (H) 70 - 99 mg/dL    Comment: Glucose reference range applies only to samples taken after fasting for at least 8 hours.   BUN 12 6 - 20 mg/dL   Creatinine, Ser 6.21 0.44 - 1.00 mg/dL   Calcium 9.8 8.9 - 30.8 mg/dL   Total Protein 8.2 (H) 6.5 - 8.1 g/dL   Albumin 4.0 3.5 - 5.0 g/dL   AST 27 15 - 41 U/L   ALT 33 0 - 44 U/L   Alkaline Phosphatase 62 38 - 126 U/L   Total Bilirubin 1.7 (H) 0.3 - 1.2 mg/dL   GFR, Estimated >65 >78 mL/min    Comment: (NOTE) Calculated using the CKD-EPI Creatinine Equation (2021)    Anion gap 13 5 - 15    Comment: Performed at [facility], [address], [place], [place] [address]  Lipase, blood     Status: None   Collection Time: 05/27/23 12:00 PM  Result Value Ref Range   Lipase 43 11 - 51 U/L    Comment: Performed at [facility], [address], [place], [place] [address]  Troponin I (High Sensitivity)     Status: None   Collection Time: 05/27/23 12:00 PM  Result Value Ref Range   Troponin I (High Sensitivity) 3 <18 ng/L    Comment: (NOTE) Elevated high sensitivity troponin I (hsTnI) values and significant  changes across serial measurements may suggest ACS but many other  chronic and acute conditions are known to elevate hsTnI results.  Refer to the "Links" section for chest  pain algorithms and additional  guidance. Performed at [facility], [address] [place], [place] [address]   Urinalysis, Routine w reflex microscopic -Urine, Clean Catch     Status: Abnormal   Collection Time: 05/27/23  5:24 PM  Result Value Ref Range   Color, Urine [place]artney (A) YELLOW    Comment: BIOCHEMICALS MAY BE AFFECTED BY COLOR   APPearance HAZY (A) CLEAR   Specific Gravity, Urine 1.024 1.005 - 1.030   pH 6.0 5.0 - 8.0   Glucose, UA NEGATIVE NEGATIVE mg/dL   Hgb urine dipstick NEGATIVE NEGATIVE   Bilirubin Urine NEGATIVE NEGATIVE   Ketones, ur 80 (A) NEGATIVE mg/dL   Protein, ur 604 (A) NEGATIVE mg/dL   Nitrite NEGATIVE NEGATIVE   Leukocytes,Ua NEGATIVE NEGATIVE   RBC / HPF 0-5 0 - 5 RBC/hpf   WBC, UA 0-5 0 - 5 WBC/hpf   Bacteria, UA NONE SEEN NONE SEEN   Squamous Epithelial / HPF 0-5 0 - 5 /HPF   Mucus PRESENT     Comment: Performed at [facility], [address], [place], [place] [address]  Rapid urine drug screen (hospital performed)     Status: Abnormal   Collection Time: 05/27/23  5:24 PM  Result Value Ref Range   Opiates POSITIVE (A) NONE DETECTED   Cocaine NONE DETECTED NONE DETECTED   Benzodiazepines NONE DETECTED NONE DETECTED   Amphetamines NONE DETECTED NONE  DETECTED   Tetrahydrocannabinol NONE DETECTED NONE DETECTED   Barbiturates NONE DETECTED NONE DETECTED    Comment: (NOTE) DRUG SCREEN FOR MEDICAL PURPOSES ONLY.  IF CONFIRMATION IS NEEDED FOR ANY PURPOSE, NOTIFY LAB WITHIN 5 DAYS.  LOWEST DETECTABLE LIMITS FOR URINE DRUG SCREEN Drug Class                     Cutoff (ng/mL) Amphetamine and metabolites    1000 Barbiturate and metabolites    200 Benzodiazepine                 200 Opiates and metabolites        300 Cocaine and metabolites        300 THC                            50 Performed at [facility], [address] [place], [place] [address]    No results found.  Pending Labs Unresulted Labs (From admission, onward)     Start     Ordered   05/27/23 1859  hCG, quantitative, pregnancy  ONCE - URGENT,   URGENT        05/27/23 1858   05/27/23 1858  TSH  ONCE - URGENT,   URGENT        05/27/23 1858   05/27/23 1310  Magnesium  Once,   STAT        05/27/23 1309            Vitals/Pain Today's Vitals   05/27/23 1500 05/27/23 1632 05/27/23 1800 05/27/23 1900  BP: 121/88  120/82 120/70  Pulse: 81  66 73  Resp: 16  15 17   Temp: 98.6 F (37 C)     TempSrc: Oral     SpO2: 98%  100% 99%  Weight:      Height:      PainSc:  3       Isolation Precautions No active isolations  Medications Medications  famotidine (PEPCID)  IVPB 20 mg premix (has no administration in time range)  acetaminophen (TYLENOL) tablet 1,000 mg (has no administration in time range)  lactated ringers bolus 1,000 mL (0 mLs Intravenous Stopped 05/27/23 1300)  ondansetron (ZOFRAN) injection 4 mg (4 mg Intravenous Given 05/27/23 1221)  morphine (PF) 4 MG/ML injection 4 mg (4 mg Intravenous Given 05/27/23 1221)  potassium chloride 10 mEq in 100 mL IVPB (0 mEq Intravenous Stopped 05/27/23 1841)  lactated ringers bolus 1,000 mL (0 mLs Intravenous Stopped 05/27/23 1841)  acetaminophen (TYLENOL) tablet 1,000 mg (1,000 mg Oral Given 05/27/23  1537)  famotidine (PEPCID) tablet 20 mg (20 mg Oral Given 05/27/23 1538)  alum & mag hydroxide-simeth (MAALOX/MYLANTA) 200-200-20 MG/5ML suspension 30 mL (30 mLs Oral Given 05/27/23 1538)  magnesium sulfate IVPB 1 g 100 mL (0 g Intravenous Stopped 05/27/23 1919)    Mobility walks     Focused Assessments     R Recommendations: See Admitting Provider Note  Report given to:   Additional Notes:

## 2023-05-27 NOTE — Discharge Instructions (Addendum)
It was our pleasure to provide your ER care today - we hope that you feel better.  Drink plenty of fluids/stay well hydrated. Take zofran as need for nausea. Take acetaminophen as need.  If GI/reflux symptoms, take pepcid and maalox as need for symptom relief. From today's labs, your potassium level is low (2.9) - eat plenty of fruits and vegetables, take potassium supplement as prescribed, and follow up with your doctor in one week.   Note that increasingly we are seeing a recurrent abdominal pain and/or vomiting syndrome called Cannabinoid Hyperemesis Syndrome - see attached info - in these cases avoiding marijuana use will prevent symptoms from recurrent (note that symptoms can persist for a few weeks if history of heavy marijuana use as it can take time to get out of system).   Follow up closely as planned with OB/GYN doctor regarding your pregnancy this week.   Return to ER right away  if worse, fevers, new symptoms, new or worsening or severe abdominal pain, persistent vomiting, chest pain, trouble breathing, or other emergency concern.  You were given pain meds in the ER - no driving for the next 6 hours.

## 2023-05-27 NOTE — ED Provider Notes (Addendum)
[facility] AT [facility] Provider Note   CSN: [id number] Arrival date & time: 05/27/23  1141     History  Chief Complaint  Patient presents with   Nausea   Emesis    [name] [name] is a 32 y.o. female.  Pt with c/o nausea/vomiting in the past 1-2 weeks, and pain to epigastric and midline/lower chest area. Symptoms at rest, constant. No change w activity or exertion. No radiation of pain, no neck or back pain. No hx pud, gallstones, or pancreatitis. Emesis not bloody or bilious. Having normal bms. No dysuria. No lower abd or pelvic pain. No vaginal discharge or bleeding. Is 7-[redacted] weeks pregnant and is not planning on keeping pregnancy.   The history is provided by the patient and medical records.  Emesis Associated symptoms: abdominal pain   Associated symptoms: no chills, no cough, no diarrhea, no fever, no headaches and no sore throat        Home Medications Prior to Admission medications   Medication Sig Start Date End Date Taking? Authorizing Provider  acetaminophen (TYLENOL) 650 MG CR tablet Take 1,300 mg by mouth every 8 (eight) hours as needed for pain.    [provider]  albuterol (VENTOLIN HFA) 108 (90 Base) MCG/ACT inhaler Inhale 2 puffs into the lungs every 6 (six) hours as needed for wheezing or shortness of breath.    [provider]  cetirizine (ZYRTEC ALLERGY) 10 MG tablet Take 1 tablet (10 mg total) by mouth daily. 12/07/22   Raspet, Noberto Retort, PA-C  EPINEPHrine 0.3 mg/0.3 mL IJ SOAJ injection Inject 0.3 mLs (0.3 mg total) into the muscle as needed for anaphylaxis. 03/19/20   Terrilee Files, MD  Erenumab-aooe (AIMOVIG, 140 MG DOSE,) 70 MG/ML SOAJ Inject 140 mg into the skin every 3 (three) months.    [provider]  erythromycin ophthalmic ointment Place a 1/2 inch ribbon of ointment into the lower eyelid. 07/25/22   Barbette Merino, NP  famotidine (PEPCID) 40 MG tablet Take 1 tablet (40 mg total) by mouth at  bedtime. 12/07/22   Raspet, Noberto Retort, PA-C  Lidocaine 4 % PTCH Apply 1 patch topically daily as needed (pain).    [provider]  medroxyPROGESTERone Acetate (DEPO-PROVERA IM) Inject 1 each into the muscle every 3 (three) months.    [provider]  Melatonin 5 MG CHEW Chew 5 mg by mouth at bedtime.    [provider]  meloxicam (MOBIC) 7.5 MG tablet Take 7.5 mg by mouth daily.    [provider]  methocarbamol (ROBAXIN) 500 MG tablet Take 1 tablet (500 mg total) by mouth every 6 (six) hours as needed for muscle spasms. 03/09/22   Yolonda Kida, MD  Multiple Vitamins-Minerals (HAIR/SKIN/NAILS/BIOTIN PO) Take 2 tablets by mouth daily.    [provider]  mupirocin ointment (BACTROBAN) 2 % Apply 1 Application topically 2 (two) times daily. 12/07/22   Raspet, Noberto Retort, PA-C  naproxen (NAPROSYN) 500 MG tablet Take 500 mg by mouth 2 (two) times daily as needed for pain. Take with food. 01/05/21   [provider]  olopatadine (PATADAY) 0.1 % ophthalmic solution Place 1 drop into both eyes 2 (two) times daily. 07/25/22   Barbette Merino, NP  omeprazole (PRILOSEC) 20 MG capsule Take 1 capsule (20 mg total) by mouth daily. 05/24/23   Horton, Clabe Seal, DO  ondansetron (ZOFRAN-ODT) 4 MG disintegrating tablet 4mg  ODT q6 hours prn nausea/vomit 05/13/23   Zavitz,  Ivin Booty, MD  ondansetron (ZOFRAN-ODT) 8 MG disintegrating tablet 8mg  ODT q8hours prn nausea 05/22/23   Palumbo, April, MD  Prenatal Vit-Fe Fumarate-FA (PRENATAL VITAMIN) 27-0.8 MG TABS Take 1 tablet by mouth daily. 05/22/23   Palumbo, April, MD  promethazine (PHENERGAN) 25 MG tablet Take 25 mg by mouth every 8 (eight) hours as needed for nausea or vomiting.    [provider]  sucralfate (CARAFATE) 1 GM/10ML suspension Take 10 mLs (1 g total) by mouth 4 (four) times daily -  with meals and at bedtime. 05/22/23   Palumbo, April, MD  sucralfate (CARAFATE) 1 GM/10ML suspension Take 10 mLs (1 g total)  by mouth 4 (four) times daily -  with meals and at bedtime. 05/22/23   Palumbo, April, MD      Allergies    Iohexol, Peanut-containing drug products, Other, Sulfa antibiotics, and Iodinated contrast media    Review of Systems   Review of Systems  Constitutional:  Negative for chills and fever.  HENT:  Negative for sore throat.   Eyes:  Negative for redness.  Respiratory:  Negative for cough and shortness of breath.   Cardiovascular:  Negative for chest pain.  Gastrointestinal:  Positive for abdominal pain, nausea and vomiting. Negative for blood in stool, constipation and diarrhea.  Genitourinary:  Negative for dysuria, flank pain, pelvic pain, vaginal bleeding and vaginal discharge.  Musculoskeletal:  Negative for back pain and neck pain.  Skin:  Negative for rash.  Neurological:  Negative for headaches.  Hematological:  Does not bruise/bleed easily.  Psychiatric/Behavioral:  Negative for confusion.     Physical Exam Updated Vital Signs BP 121/88 (BP Location: Left Arm)   Pulse 81   Temp 98.6 F (37 C) (Oral)   Resp 16   Ht 1.575 m (5\' 2" )   Wt 80 kg   LMP 12/07/2022   SpO2 98%   BMI 32.26 kg/m  Physical Exam Vitals and nursing note reviewed.  Constitutional:      Appearance: Normal appearance. She is well-developed.  HENT:     Head: Atraumatic.     Nose: Nose normal.     Mouth/Throat:     Mouth: Mucous membranes are moist.  Eyes:     General: No scleral icterus.    Conjunctiva/sclera: Conjunctivae normal.  Neck:     Trachea: No tracheal deviation.  Cardiovascular:     Rate and Rhythm: Normal rate and regular rhythm.     Pulses: Normal pulses.     Heart sounds: Normal heart sounds. No murmur heard.    No friction rub. No gallop.  Pulmonary:     Effort: Pulmonary effort is normal. No respiratory distress.     Breath sounds: Normal breath sounds.  Abdominal:     General: Bowel sounds are normal. There is no distension.     Palpations: Abdomen is soft. There is  no mass.     Tenderness: There is abdominal tenderness. There is no guarding or rebound.     Hernia: No hernia is present.     Comments: Epigastric tenderness.   Genitourinary:    Comments: No cva tenderness.  Musculoskeletal:        General: No swelling or tenderness.     Cervical back: Normal range of motion and neck supple. No rigidity. No muscular tenderness.     Right lower leg: No edema.     Left lower leg: No edema.  Skin:    General: Skin is warm and dry.  Findings: No rash.  Neurological:     Mental Status: She is alert.     Comments: Alert, speech normal.   Psychiatric:        Mood and Affect: Mood normal.     ED Results / Procedures / Treatments   Labs (all labs ordered are listed, but only abnormal results are displayed) Results for orders placed or performed during the hospital encounter of 05/27/23  CBC  Result Value Ref Range   WBC 10.4 4.0 - 10.5 K/uL   RBC 5.32 (H) 3.87 - 5.11 MIL/uL   Hemoglobin 14.0 12.0 - 15.0 g/dL   HCT 16.1 09.6 - 04.5 %   MCV 79.9 (L) 80.0 - 100.0 fL   MCH 26.3 26.0 - 34.0 pg   MCHC 32.9 30.0 - 36.0 g/dL   RDW 40.9 81.1 - 91.4 %   Platelets 350 150 - 400 K/uL   nRBC 0.0 0.0 - 0.2 %  Comprehensive metabolic panel  Result Value Ref Range   Sodium 134 (L) 135 - 145 mmol/L   Potassium 2.9 (L) 3.5 - 5.1 mmol/L   Chloride 100 98 - 111 mmol/L   CO2 21 (L) 22 - 32 mmol/L   Glucose, Bld 103 (H) 70 - 99 mg/dL   BUN 12 6 - 20 mg/dL   Creatinine, Ser 7.82 0.44 - 1.00 mg/dL   Calcium 9.8 8.9 - 95.6 mg/dL   Total Protein 8.2 (H) 6.5 - 8.1 g/dL   Albumin 4.0 3.5 - 5.0 g/dL   AST 27 15 - 41 U/L   ALT 33 0 - 44 U/L   Alkaline Phosphatase 62 38 - 126 U/L   Total Bilirubin 1.7 (H) 0.3 - 1.2 mg/dL   GFR, Estimated >21 >30 mL/min   Anion gap 13 5 - 15  Lipase, blood  Result Value Ref Range   Lipase 43 11 - 51 U/L  Troponin I (High Sensitivity)  Result Value Ref Range   Troponin I (High Sensitivity) 3 <18 ng/L   DG Chest Portable 1  View  Result Date: 05/22/2023 CLINICAL DATA:  Chest pain and headache. EXAM: PORTABLE CHEST 1 VIEW COMPARISON:  May 25, 2018 FINDINGS: The heart size and mediastinal contours are within normal limits. Both lungs are clear. The visualized skeletal structures are unremarkable. IMPRESSION: No active disease. Electronically Signed   By: Aram Candela M.D.   On: 05/22/2023 02:25   CT Head Wo Contrast  Result Date: 05/13/2023 CLINICAL DATA:  Trauma EXAM: CT HEAD WITHOUT CONTRAST TECHNIQUE: Contiguous axial images were obtained from the base of the skull through the vertex without intravenous contrast. RADIATION DOSE REDUCTION: This exam was performed according to the departmental dose-optimization program which includes automated exposure control, adjustment of the mA and/or kV according to patient size and/or use of iterative reconstruction technique. COMPARISON:  Head CT 09/01/2017 FINDINGS: Brain: No evidence of acute infarction, hemorrhage, hydrocephalus, extra-axial collection or mass lesion/mass effect. Vascular: No hyperdense vessel or unexpected calcification. Skull: Normal. Negative for fracture or focal lesion. Sinuses/Orbits: No acute finding. Other: None. IMPRESSION: No acute intracranial pathology. Electronically Signed   By: Darliss Cheney M.D.   On: 05/13/2023 16:39     EKG EKG Interpretation Date/Time:  Monday May 27 2023 17:06:09 EDT Ventricular Rate:  67 PR Interval:  149 QRS Duration:  89 QT Interval:  398 QTC Calculation: 421 R Axis:   39  Text Interpretation: Sinus arrhythmia Nonspecific T wave abnormality Confirmed by Cathren Laine ([id number]) on 05/27/2023 5:21:58 PM  Radiology No results found.  Procedures Procedures    Medications Ordered in ED Medications  potassium chloride 10 mEq in 100 mL IVPB (10 mEq Intravenous New Bag/Given 05/27/23 1659)  magnesium sulfate IVPB 1 g 100 mL (has no administration in time range)  lactated ringers bolus 1,000 mL (0 mLs Intravenous  Stopped 05/27/23 1300)  ondansetron (ZOFRAN) injection 4 mg (4 mg Intravenous Given 05/27/23 1221)  morphine (PF) 4 MG/ML injection 4 mg (4 mg Intravenous Given 05/27/23 1221)  lactated ringers bolus 1,000 mL (1,000 mLs Intravenous New Bag/Given 05/27/23 1443)  acetaminophen (TYLENOL) tablet 1,000 mg (1,000 mg Oral Given 05/27/23 1537)  famotidine (PEPCID) tablet 20 mg (20 mg Oral Given 05/27/23 1538)  alum & mag hydroxide-simeth (MAALOX/MYLANTA) 200-200-20 MG/5ML suspension 30 mL (30 mLs Oral Given 05/27/23 1538)    ED Course/ Medical Decision Making/ A&P                             Medical Decision Making Problems Addressed: Dehydration: acute illness or injury with systemic symptoms that poses a threat to life or bodily functions First trimester pregnancy: acute illness or injury Nausea and vomiting in adult: acute illness or injury with systemic symptoms that poses a threat to life or bodily functions Upper abdominal pain: acute illness or injury with systemic symptoms that poses a threat to life or bodily functions  Amount and/or Complexity of Data Reviewed Independent Historian: EMS    Details: hx External Data Reviewed: notes. Labs: ordered. Decision-making details documented in ED Course. ECG/medicine tests: ordered and independent interpretation performed. Decision-making details documented in ED Course.  Risk OTC drugs. Prescription drug management. Parenteral controlled substances. Decision regarding hospitalization.   Iv ns. Continuous pulse ox and cardiac monitoring. Labs ordered/sent.   Differential diagnosis includes gastroenteritis, gastritis/pud, pancreatitis, CHS, etc. Dispo decision including potential need for admission considered - will get labs and reassess.   Reviewed nursing notes and prior charts for additional history. External reports reviewed. Additional history from: EMS.   Cardiac monitor: sinus rhythm, rate 88.  LR bolus. Zofran iv. Morphine iv.   Labs  reviewed/interpreted by me - wbc and hgb normal. Trop 3/normal, after symptoms present for > 1 day, felt not c/w acs.   Recheck, symptoms improved. Abd soft non tender. Pepcid po, acetaminophen po, maalox po.  Additional ivf. Trial of po fluids.   Recheck abd soft non tender.   1640, ua/labs still pending, as well as po fluid trial - signed out to Dr Lynelle Doctor to check pending labs, recheck pt, and dispo appropriately.          Final Clinical Impression(s) / ED Diagnoses Final diagnoses:  Nausea and vomiting in adult  Dehydration  Upper abdominal pain  First trimester pregnancy  Hypokalemia    Rx / DC Orders ED Discharge Orders     None          Cathren Laine, MD 05/27/23 1732

## 2023-05-27 NOTE — ED Provider Notes (Signed)
Patient still is unable to keep anything down.  She continues to have discomfort in her epigastric region and chest.  Patient has been taking antacid and antiemetic medications and as an outpatient without relief.  Patient's labs are notable for hypokalemia and ketonuria.  With her persistent nausea and vomiting I will consult with OB/GYN for possible admission for hyperemesis.  Case discussed with Dr. Shawnie Pons.  Patient will be admitted for further treatment   Linwood Dibbles, MD 05/27/23 [phone]

## 2023-05-27 NOTE — H&P (Signed)
FACULTY PRACTICE ANTEPARTUM ADMISSION HISTORY AND PHYSICAL NOTE   History of Present Illness: Adaugo Konopacki is a 32 y.o. G2P1001 at [redacted]w[redacted]d gestation as per ultrasound today admitted with uncontrolled nausea and vomiting. She presented to [facility] ER today with these symptoms; as per the ED MD notes and the patient: Nausea/vomiting in the past 1-2 weeks, and pain to epigastric and midline/lower chest area. Symptoms at rest, constant. No change with activity or exertion. No radiation of pain, no neck or back pain. No history PUD, gallstones, or pancreatitis. Emesis not bloody or bilious. Normal BM, no dysuria, no lower abd or pelvic pain. No vaginal discharge or bleeding. Patient is not planning on keeping pregnancy.  She was found to be hypokalemic in the ER, given IV fluids, antiemetics and K repletion. She continued to have nausea and vomiting, uncontrolled after seven hours.  Our service was then consulted for admission and further management. On arrival here at [facility], she reports continued nausea and discomfort in her chest and epigastric region due to the repeated vomiting.  She denies any other symptoms.  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   Sprain of MCL (medial collateral ligament) of knee 10/12/2021    Past Surgical History:  Procedure Laterality Date   BIOPSY  05/27/2018   Procedure: BIOPSY;  Surgeon: Graylin Shiver, MD;  Location: [facility] ENDOSCOPY;  Service: Endoscopy;;   ESOPHAGOGASTRODUODENOSCOPY (EGD) WITH PROPOFOL N/A  09/11/2017   Procedure: ESOPHAGOGASTRODUODENOSCOPY (EGD) WITH PROPOFOL;  Surgeon: Charlott Rakes, MD;  Location: [facility] ENDOSCOPY;  Service: Endoscopy;  Laterality: N/A;   ESOPHAGOGASTRODUODENOSCOPY (EGD) WITH PROPOFOL N/A 02/20/2018   Procedure: ESOPHAGOGASTRODUODENOSCOPY (EGD) WITH PROPOFOL;  Surgeon: Rachael Fee, MD;  Location: [facility] ENDOSCOPY;  Service: Endoscopy;  Laterality: N/A;   ESOPHAGOGASTRODUODENOSCOPY (EGD) WITH PROPOFOL N/A 05/27/2018   Procedure: ESOPHAGOGASTRODUODENOSCOPY (EGD) WITH PROPOFOL WITH CLIP PLACEMENT;  Surgeon: Graylin Shiver, MD;  Location: [facility] ENDOSCOPY;  Service: Endoscopy;  Laterality: N/A;   INSERTION OF MESH N/A 12/26/2016   Procedure: INSERTION OF MESH;  Surgeon: Abigail Miyamoto, MD;  Location: [facility] ORS;  Service: General;  Laterality: N/A;   KNEE ARTHROSCOPY WITH MEDIAL PATELLAR FEMORAL LIGAMENT RECONSTRUCTION Left 03/09/2022   Procedure: LEFT KNEE ARTHROSCOPY WITH MEDIAL PATELLAR FEMORAL LIGAMENT RECONSTRUCTION WILL ALLOGRAFT;  Surgeon: Yolonda Kida, MD;  Location: [facility] ORS;  Service: Orthopedics;  Laterality: Left;   LAPAROSCOPY N/A 12/26/2016   Procedure: LAPAROSCOPY DIAGNOSTIC;  Surgeon: Abigail Miyamoto, MD;  Location: [facility] ORS;  Service: General;  Laterality: N/A;   UMBILICAL HERNIA REPAIR N/A 12/26/2016   Procedure: HERNIA REPAIR UMBILICAL ADULT;  Surgeon: Abigail Miyamoto, MD;  Location: [facility] ORS;  Service: General;  Laterality: N/A;    OB History  Gravida Para Term Preterm AB Living  2 1 1     1   SAB IAB Ectopic Multiple Live Births          1    # Outcome Date GA Lbr Len/2nd Weight Sex Delivery Anes PTL Lv  2 Current  1 Term 05/15/12 [redacted]w[redacted]d 15:52 / 00:19 2892 g M Vag-Vacuum EPI  LIV    Social History   Socioeconomic History   Marital status: Single    Spouse name: Not on file   Number of children: Not on file   Years of education: Not on file   Highest education level: Not on file  Occupational History   Not on file  Tobacco Use    Smoking status: Former    Packs/day: 0.25    Years: 4.00    Additional pack years: 0.00    Total pack years: 1.00    Types: Cigarettes    Quit date: 2019    Years since quitting: 5.5   Smokeless tobacco: Never  Vaping Use   Vaping Use: Never used  Substance and Sexual Activity   Alcohol use: Yes    Comment: rarely   Drug use: No   Sexual activity: Never    Birth control/protection: None  Other Topics Concern   Not on file  Social History Narrative   Right handed   Two story home    Drinks caffeine   Social Determinants of Health   Financial Resource Strain: Not on file  Food Insecurity: Not on file  Transportation Needs: Not on file  Physical Activity: Not on file  Stress: Not on file  Social Connections: Not on file    Family History  Problem Relation Age of Onset   Hypertension Mother    Stroke Mother    CAD Mother 58       MI on 10/18   Colon cancer Neg Hx    Stomach cancer Neg Hx    Rectal cancer Neg Hx     Allergies  Allergen Reactions   Iohexol Hives, Shortness Of Breath, Itching, Nausea And Vomiting, Swelling and Other (See Comments)    Also caused chest pain PER DR BARRY, PRE MED PROTOCOL SHOULD BE ADMINISTERED PRIOR TO SCANS IN THE FUTURE.  PT HAD A SIGNIFICANT REACTION.  [facility]  08/18/17 Patient had a reaction of SOB, swelling even though premedicated. Physician states no further contrast imaging in the future.   Peanut-Containing Drug Products Anaphylaxis and Swelling    Facial swelling   Other Swelling    Tree nuts cause facial swelling   Sulfa Antibiotics Swelling    Facial swelling (reported by [facility] 2013)   Iodinated Contrast Media Rash    Medications Prior to Admission  Medication Sig Dispense Refill Last Dose   acetaminophen (TYLENOL) 650 MG CR tablet Take 1,300 mg by mouth every 8 (eight) hours as needed for pain.      albuterol (VENTOLIN HFA) 108 (90 Base) MCG/ACT inhaler Inhale 2 puffs into the lungs every 6 (six) hours as needed  for wheezing or shortness of breath.      cetirizine (ZYRTEC ALLERGY) 10 MG tablet Take 1 tablet (10 mg total) by mouth daily. 30 tablet 0    EPINEPHrine 0.3 mg/0.3 mL IJ SOAJ injection Inject 0.3 mLs (0.3 mg total) into the muscle as needed for anaphylaxis. 2 each 3    Erenumab-aooe (AIMOVIG, 140 MG DOSE,) 70 MG/ML SOAJ Inject 140 mg into the skin every 3 (three) months.      erythromycin ophthalmic ointment Place a 1/2 inch ribbon of ointment into the lower eyelid. 3.5 g 0    famotidine (PEPCID) 40 MG tablet Take 1 tablet (40 mg total) by mouth at bedtime. 30 tablet 1    Lidocaine 4 % PTCH Apply 1 patch  topically daily as needed (pain).      medroxyPROGESTERone Acetate (DEPO-PROVERA IM) Inject 1 each into the muscle every 3 (three) months.      Melatonin 5 MG CHEW Chew 5 mg by mouth at bedtime.      meloxicam (MOBIC) 7.5 MG tablet Take 7.5 mg by mouth daily.      methocarbamol (ROBAXIN) 500 MG tablet Take 1 tablet (500 mg total) by mouth every 6 (six) hours as needed for muscle spasms. 50 tablet 1    Multiple Vitamins-Minerals (HAIR/SKIN/NAILS/BIOTIN PO) Take 2 tablets by mouth daily.      mupirocin ointment (BACTROBAN) 2 % Apply 1 Application topically 2 (two) times daily. 22 g 0    naproxen (NAPROSYN) 500 MG tablet Take 500 mg by mouth 2 (two) times daily as needed for pain. Take with food.      olopatadine (PATADAY) 0.1 % ophthalmic solution Place 1 drop into both eyes 2 (two) times daily. 5 mL 12    omeprazole (PRILOSEC) 20 MG capsule Take 1 capsule (20 mg total) by mouth daily. 30 capsule 0    ondansetron (ZOFRAN-ODT) 4 MG disintegrating tablet 4mg  ODT q6 hours prn nausea/vomit 10 tablet 0    ondansetron (ZOFRAN-ODT) 8 MG disintegrating tablet 8mg  ODT q8hours prn nausea 10 tablet 0    Prenatal Vit-Fe Fumarate-FA (PRENATAL VITAMIN) 27-0.8 MG TABS Take 1 tablet by mouth daily. 30 tablet 0    promethazine (PHENERGAN) 25 MG tablet Take 25 mg by mouth every 8 (eight) hours as needed for nausea  or vomiting.      sucralfate (CARAFATE) 1 GM/10ML suspension Take 10 mLs (1 g total) by mouth 4 (four) times daily -  with meals and at bedtime. 420 mL 0    sucralfate (CARAFATE) 1 GM/10ML suspension Take 10 mLs (1 g total) by mouth 4 (four) times daily -  with meals and at bedtime. 420 mL 0     Review of Systems - Negative except what is mentioned in the HPI  Vitals:  BP 125/63 (BP Location: Left Arm)   Pulse 81   Temp 98.5 F (36.9 C) (Oral)   Resp (!) 21   Ht 5\' 2"  (1.575 m)   Wt 80 kg   LMP 12/07/2022   SpO2 100%   BMI 32.26 kg/m  Physical Examination: CONSTITUTIONAL: Well-developed, well-nourished female in no acute distress.  HENT:  Normocephalic, atraumatic, External right and left ear normal. Oropharynx is clear and moist EYES: Conjunctivae and EOM are normal. Pupils are equal, round, and reactive to light. No scleral icterus.  NECK: Normal range of motion, supple, no masses SKIN: Skin is warm and dry. No rash noted. Not diaphoretic. No erythema. No pallor. NEUROLOGIC: Alert and oriented to person, place, and time. Normal reflexes, muscle tone coordination. No cranial nerve deficit noted. PSYCHIATRIC: Normal mood and affect. Normal behavior. Normal judgment and thought content. CARDIOVASCULAR: Normal heart rate noted, regular rhythm RESPIRATORY: Effort and breath sounds normal, no problems with respiration noted ABDOMEN: Soft, mild lower abdominal tenderness, no rebound or guarding MUSCULOSKELETAL: Normal range of motion. No edema and no tenderness. 2+ distal pulses.  Labs:  Results for orders placed or performed during the hospital encounter of 05/27/23 (from the past 24 hour(s))  CBC   Collection Time: 05/27/23 12:00 PM  Result Value Ref Range   WBC 10.4 4.0 - 10.5 K/uL   RBC 5.32 (H) 3.87 - 5.11 MIL/uL   Hemoglobin 14.0 12.0 - 15.0 g/dL   HCT 16.1 09.6 -  46.0 %   MCV 79.9 (L) 80.0 - 100.0 fL   MCH 26.3 26.0 - 34.0 pg   MCHC 32.9 30.0 - 36.0 g/dL   RDW 16.1 09.6 -  04.5 %   Platelets 350 150 - 400 K/uL   nRBC 0.0 0.0 - 0.2 %  Comprehensive metabolic panel   Collection Time: 05/27/23 12:00 PM  Result Value Ref Range   Sodium 134 (L) 135 - 145 mmol/L   Potassium 2.9 (L) 3.5 - 5.1 mmol/L   Chloride 100 98 - 111 mmol/L   CO2 21 (L) 22 - 32 mmol/L   Glucose, Bld 103 (H) 70 - 99 mg/dL   BUN 12 6 - 20 mg/dL   Creatinine, Ser 4.09 0.44 - 1.00 mg/dL   Calcium 9.8 8.9 - 81.1 mg/dL   Total Protein 8.2 (H) 6.5 - 8.1 g/dL   Albumin 4.0 3.5 - 5.0 g/dL   AST 27 15 - 41 U/L   ALT 33 0 - 44 U/L   Alkaline Phosphatase 62 38 - 126 U/L   Total Bilirubin 1.7 (H) 0.3 - 1.2 mg/dL   GFR, Estimated >91 >47 mL/min   Anion gap 13 5 - 15  Lipase, blood   Collection Time: 05/27/23 12:00 PM  Result Value Ref Range   Lipase 43 11 - 51 U/L  Magnesium   Collection Time: 05/27/23 12:00 PM  Result Value Ref Range   Magnesium 2.1 1.7 - 2.4 mg/dL  hCG, quantitative, pregnancy   Collection Time: 05/27/23 12:00 PM  Result Value Ref Range   hCG, Beta Chain, Quant, S 142,124 (H) <5 mIU/mL  TSH   Collection Time: 05/27/23 12:00 PM  Result Value Ref Range   TSH 0.038 (L) 0.350 - 4.500 uIU/mL  Troponin I (High Sensitivity)   Collection Time: 05/27/23 12:00 PM  Result Value Ref Range   Troponin I (High Sensitivity) 3 <18 ng/L  Urinalysis, Routine w reflex microscopic -Urine, Clean Catch   Collection Time: 05/27/23  5:24 PM  Result Value Ref Range   Color, Urine Ebba (A) YELLOW   APPearance HAZY (A) CLEAR   Specific Gravity, Urine 1.024 1.005 - 1.030   pH 6.0 5.0 - 8.0   Glucose, UA NEGATIVE NEGATIVE mg/dL   Hgb urine dipstick NEGATIVE NEGATIVE   Bilirubin Urine NEGATIVE NEGATIVE   Ketones, ur 80 (A) NEGATIVE mg/dL   Protein, ur 829 (A) NEGATIVE mg/dL   Nitrite NEGATIVE NEGATIVE   Leukocytes,Ua NEGATIVE NEGATIVE   RBC / HPF 0-5 0 - 5 RBC/hpf   WBC, UA 0-5 0 - 5 WBC/hpf   Bacteria, UA NONE SEEN NONE SEEN   Squamous Epithelial / HPF 0-5 0 - 5 /HPF   Mucus PRESENT    Rapid urine drug screen (hospital performed)   Collection Time: 05/27/23  5:24 PM  Result Value Ref Range   Opiates POSITIVE (A) NONE DETECTED   Cocaine NONE DETECTED NONE DETECTED   Benzodiazepines NONE DETECTED NONE DETECTED   Amphetamines NONE DETECTED NONE DETECTED   Tetrahydrocannabinol NONE DETECTED NONE DETECTED   Barbiturates NONE DETECTED NONE DETECTED    Imaging Studies: US OB Comp Less 14 Wks  Result Date: 05/27/2023 CLINICAL DATA:  Elevated HCG for gestational age. EXAM: OBSTETRIC <14 WK ULTRASOUND TECHNIQUE: Transabdominal ultrasound was performed for evaluation of the gestation as well as the maternal uterus and adnexal regions. COMPARISON:  None Available. FINDINGS: Intrauterine gestational sac: Single intrauterine gestational sac. Yolk sac:  Not seen Embryo:  Present Cardiac Activity: Detected  Heart Rate: 151 bpm CRL:   63 mm   12 w 5 d                  [place] EDC: 12/04/2023 Subchorionic hemorrhage:  None visualized. Maternal uterus/adnexae: The maternal ovaries are unremarkable. No significant free fluid within the pelvis. IMPRESSION: Single live intrauterine pregnancy with an estimated gestational age of [redacted] weeks, 5 days. Electronically Signed   By: Elgie Collard M.D.   On: 05/27/2023 21:54   DG Chest Portable 1 View  Result Date: 05/22/2023 CLINICAL DATA:  Chest pain and headache. EXAM: PORTABLE CHEST 1 VIEW COMPARISON:  May 25, 2018 FINDINGS: The heart size and mediastinal contours are within normal limits. Both lungs are clear. The visualized skeletal structures are unremarkable. IMPRESSION: No active disease. Electronically Signed   By: Aram Candela M.D.   On: 05/22/2023 02:25   CT Head Wo Contrast  Result Date: 05/13/2023 CLINICAL DATA:  Trauma EXAM: CT HEAD WITHOUT CONTRAST TECHNIQUE: Contiguous axial images were obtained from the base of the skull through the vertex without intravenous contrast. RADIATION DOSE REDUCTION: This exam was performed according to the  departmental dose-optimization program which includes automated exposure control, adjustment of the mA and/or kV according to patient size and/or use of iterative reconstruction technique. COMPARISON:  Head CT 09/01/2017 FINDINGS: Brain: No evidence of acute infarction, hemorrhage, hydrocephalus, extra-axial collection or mass lesion/mass effect. Vascular: No hyperdense vessel or unexpected calcification. Skull: Normal. Negative for fracture or focal lesion. Sinuses/Orbits: No acute finding. Other: None. IMPRESSION: No acute intracranial pathology. Electronically Signed   By: Darliss Cheney M.D.   On: 05/13/2023 16:39     Assessment and Plan: Patient Active Problem List   Diagnosis Date Noted   Hyperemesis gravidarum with electrolyte imbalance 05/27/2023   Hypokalemia 05/27/2023   [redacted] weeks gestation of pregnancy 05/27/2023   Admit to Antenatal Service at [facility]SC Viable [redacted]w[redacted]d pregnancy seen on ultrasound, no anomalies noted. Ordered for IV fluids, multiple antiemetics ordered as needed.  Also ordered for Pepcid 20 mg IV bid. NPO for now, can ADAT later. Robaxin IM prn ordered for muscle/sternal pain, also Dilaudid IV prn.  Currently has hypokalemia, ordered for potassium IV repletion.  Daily labs ordered, will replete electrolytes accordingly.  Follow up thyroid labs.  Activity as tolerated, SCDs ordered for VTE prphylaxis. Routine antenatal care.   Jaynie Collins, MD, FACOG Attending Obstetrician & Gynecologist Faculty Practice, [facility]

## 2023-05-27 NOTE — ED Triage Notes (Signed)
BIBA. Pt reports N/V. Has been seen multiple times in the last few weeks with same complaints. Pt also pregnant, no prenatal care reported. No other symptoms.

## 2023-05-28 DIAGNOSIS — E876 Hypokalemia: Secondary | ICD-10-CM

## 2023-05-28 DIAGNOSIS — O211 Hyperemesis gravidarum with metabolic disturbance: Principal | ICD-10-CM

## 2023-05-28 DIAGNOSIS — Z3A12 12 weeks gestation of pregnancy: Secondary | ICD-10-CM

## 2023-05-28 LAB — CBC
HCT: 34.3 % — ABNORMAL LOW (ref 36.0–46.0)
Hemoglobin: 11 g/dL — ABNORMAL LOW (ref 12.0–15.0)
MCH: 25.8 pg — ABNORMAL LOW (ref 26.0–34.0)
MCHC: 32.1 g/dL (ref 30.0–36.0)
MCV: 80.5 fL (ref 80.0–100.0)
Platelets: 267 10*3/uL (ref 150–400)
RBC: 4.26 MIL/uL (ref 3.87–5.11)
RDW: 13.1 % (ref 11.5–15.5)
WBC: 8.5 10*3/uL (ref 4.0–10.5)
nRBC: 0 % (ref 0.0–0.2)

## 2023-05-28 LAB — COMPREHENSIVE METABOLIC PANEL
ALT: 27 U/L (ref 0–44)
AST: 22 U/L (ref 15–41)
Albumin: 2.8 g/dL — ABNORMAL LOW (ref 3.5–5.0)
Alkaline Phosphatase: 47 U/L (ref 38–126)
Anion gap: 9 (ref 5–15)
BUN: <5 mg/dL — ABNORMAL LOW (ref 6–20)
CO2: 21 mmol/L — ABNORMAL LOW (ref 22–32)
Calcium: 8.5 mg/dL — ABNORMAL LOW (ref 8.9–10.3)
Chloride: 103 mmol/L (ref 98–111)
Creatinine, Ser: 0.6 mg/dL (ref 0.44–1.00)
GFR, Estimated: >60 mL/min (ref >60)
Glucose, Bld: 85 mg/dL (ref 70–99)
Potassium: 3.5 mmol/L (ref 3.5–5.1)
Sodium: 133 mmol/L — ABNORMAL LOW (ref 135–145)
Total Bilirubin: 1.8 mg/dL — ABNORMAL HIGH (ref 0.3–1.2)
Total Protein: 6 g/dL — ABNORMAL LOW (ref 6.5–8.1)

## 2023-05-28 LAB — TYPE AND SCREEN
ABO/RH(D): O POS
Antibody Screen: NEGATIVE

## 2023-05-28 LAB — T4, FREE: Free T4: 1.5 ng/dL — ABNORMAL HIGH (ref 0.61–1.12)

## 2023-05-28 LAB — MAGNESIUM: Magnesium: 1.8 mg/dL (ref 1.7–2.4)

## 2023-05-28 MED ORDER — METHOCARBAMOL 1000 MG/10ML IJ SOLN
500.0000 mg | Freq: Three times a day (TID) | INTRAVENOUS | Status: DC | PRN
  Administered 2023-05-28: 500 mg via INTRAVENOUS
  Filled 2023-05-28: qty 5

## 2023-05-28 NOTE — Progress Notes (Addendum)
FACULTY PRACTICE ANTEPARTUM COMPREHENSIVE PROGRESS NOTE  [name] [name] is a 32 y.o. G2P1001 at [redacted]w[redacted]d who is admitted for HEG.  Estimated Date of Delivery: 12/04/23  Length of Stay:  1 Days. Admitted 05/27/2023  Subjective: Still reports chest pain from vomiting.  Vomiting is improved, not tolerating any oral intake yet.   She reports no abdominal pain, bleeding or loss of fluid per vagina.  Vitals:  Blood pressure 110/63, pulse 62, temperature 98 F (36.7 C), temperature source Oral, resp. rate 16, height 5\' 2"  (1.575 m), weight 80 kg, last menstrual period 12/07/2022, SpO2 100 %. Physical Examination: CONSTITUTIONAL: Well-developed, well-nourished female in no acute distress.  NEUROLOGIC: Alert and oriented to person, place, and time. No cranial nerve deficit noted. PSYCHIATRIC: Normal mood and affect. Normal behavior. Normal judgment and thought content. CARDIOVASCULAR: Normal heart rate noted, regular rhythm RESPIRATORY: Effort and breath sounds normal, no problems with respiration noted MUSCULOSKELETAL: Normal range of motion. No edema and no tenderness. 2+ distal pulses. Point tenderness over sternal area. ABDOMEN: Soft, nontender, nondistended CERVIX:  Deferred  Studies:    Latest Ref Rng & Units 05/28/2023    4:41 AM 05/27/2023   12:00 PM 05/24/2023    2:41 PM  CBC  WBC 4.0 - 10.5 K/uL 8.5  10.4  7.4   Hemoglobin 12.0 - 15.0 g/dL 16.1  09.6  04.5   Hematocrit 36.0 - 46.0 % 34.3  42.5  38.4   Platelets 150 - 400 K/uL 267  350  279       Latest Ref Rng & Units 05/28/2023    4:41 AM 05/27/2023   12:00 PM 05/24/2023    2:41 PM  CMP  Glucose 70 - 99 mg/dL 85  409  93   BUN 6 - 20 mg/dL 5  12  13    Creatinine 0.44 - 1.00 mg/dL 8.11  9.14  7.82   Sodium 135 - 145 mmol/L 133  134  137   Potassium 3.5 - 5.1 mmol/L 3.5  2.9  3.2   Chloride 98 - 111 mmol/L 103  100  104   CO2 22 - 32 mmol/L 21  21  24    Calcium 8.9 - 10.3 mg/dL 8.5  9.8  9.4   Total Protein 6.5 - 8.1 g/dL 6.0  8.2  8.2    Total Bilirubin 0.3 - 1.2 mg/dL 1.8  1.7  1.1   Alkaline Phos 38 - 126 U/L 47  62  59   AST 15 - 41 U/L 22  27  23    ALT 0 - 44 U/L 27  33  23      US OB Comp Less 14 Wks  Result Date: 05/27/2023 CLINICAL DATA:  Elevated HCG for gestational age. EXAM: OBSTETRIC <14 WK ULTRASOUND TECHNIQUE: Transabdominal ultrasound was performed for evaluation of the gestation as well as the maternal uterus and adnexal regions. COMPARISON:  None Available. FINDINGS: Intrauterine gestational sac: Single intrauterine gestational sac. Yolk sac:  Not seen Embryo:  Present Cardiac Activity: Detected Heart Rate: 151 bpm CRL:   63 mm   12 w 5 d                  [place] EDC: 12/04/2023 Subchorionic hemorrhage:  None visualized. Maternal uterus/adnexae: The maternal ovaries are unremarkable. No significant free fluid within the pelvis. IMPRESSION: Single live intrauterine pregnancy with an estimated gestational age of [redacted] weeks, 5 days. Electronically Signed   By: Elgie Collard M.D.   On: 05/27/2023 21:54  Current scheduled medications  docusate sodium  100 mg Oral Daily   folic acid  1 mg Oral Daily   Or   folic acid  1 mg Intravenous Daily   metoCLOPramide  10 mg Oral Q6H   Or   metoCLOPramide (REGLAN) injection  10 mg Intravenous Q6H   prenatal multivitamin  1 tablet Oral Q1200   scopolamine  1 patch Transdermal Q72H   sucralfate  1 g Oral TID WC & HS    I have reviewed the patient's current medications.  ASSESSMENT: Principal Problem:   Hyperemesis gravidarum with electrolyte imbalance Active Problems:   Hypokalemia   [redacted] weeks gestation of pregnancy   PLAN: Continue antiemetics, ADAT Normal K this morning, will check labs tomorrow morning. Dilaudid, robaxin ordered for musculoskeletal chest discomfort Continue routine antenatal care.   Jaynie Collins, MD, FACOG Obstetrician & Gynecologist, [facility] for [employer], [facility]

## 2023-05-29 ENCOUNTER — Other Ambulatory Visit (HOSPITAL_COMMUNITY): Payer: Self-pay

## 2023-05-29 ENCOUNTER — Other Ambulatory Visit: Payer: Self-pay | Admitting: Obstetrics and Gynecology

## 2023-05-29 DIAGNOSIS — Z3481 Encounter for supervision of other normal pregnancy, first trimester: Secondary | ICD-10-CM

## 2023-05-29 DIAGNOSIS — O211 Hyperemesis gravidarum with metabolic disturbance: Principal | ICD-10-CM

## 2023-05-29 DIAGNOSIS — Z3A13 13 weeks gestation of pregnancy: Secondary | ICD-10-CM

## 2023-05-29 LAB — COMPREHENSIVE METABOLIC PANEL
ALT: 31 U/L (ref 0–44)
ALT: 38 U/L (ref 0–44)
AST: 27 U/L (ref 15–41)
AST: 35 U/L (ref 15–41)
Albumin: 2.8 g/dL — ABNORMAL LOW (ref 3.5–5.0)
Albumin: 3.1 g/dL — ABNORMAL LOW (ref 3.5–5.0)
Alkaline Phosphatase: 48 U/L (ref 38–126)
Alkaline Phosphatase: 55 U/L (ref 38–126)
Anion gap: 9 (ref 5–15)
Anion gap: 8 (ref 5–15)
BUN: <5 mg/dL — ABNORMAL LOW (ref 6–20)
BUN: <5 mg/dL — ABNORMAL LOW (ref 6–20)
CO2: 24 mmol/L (ref 22–32)
CO2: 22 mmol/L (ref 22–32)
Calcium: 8.7 mg/dL — ABNORMAL LOW (ref 8.9–10.3)
Calcium: 9 mg/dL (ref 8.9–10.3)
Chloride: 100 mmol/L (ref 98–111)
Chloride: 101 mmol/L (ref 98–111)
Creatinine, Ser: 0.61 mg/dL (ref 0.44–1.00)
Creatinine, Ser: 0.64 mg/dL (ref 0.44–1.00)
GFR, Estimated: >60 mL/min (ref >60)
GFR, Estimated: >60 mL/min (ref >60)
Glucose, Bld: 89 mg/dL (ref 70–99)
Glucose, Bld: 96 mg/dL (ref 70–99)
Potassium: 3 mmol/L — ABNORMAL LOW (ref 3.5–5.1)
Potassium: 3.5 mmol/L (ref 3.5–5.1)
Sodium: 133 mmol/L — ABNORMAL LOW (ref 135–145)
Sodium: 131 mmol/L — ABNORMAL LOW (ref 135–145)
Total Bilirubin: 1.5 mg/dL — ABNORMAL HIGH (ref 0.3–1.2)
Total Bilirubin: 0.9 mg/dL (ref 0.3–1.2)
Total Protein: 5.8 g/dL — ABNORMAL LOW (ref 6.5–8.1)
Total Protein: 6.4 g/dL — ABNORMAL LOW (ref 6.5–8.1)

## 2023-05-29 LAB — MAGNESIUM: Magnesium: 1.7 mg/dL (ref 1.7–2.4)

## 2023-05-29 LAB — CBC
HCT: 32.8 % — ABNORMAL LOW (ref 36.0–46.0)
Hemoglobin: 11 g/dL — ABNORMAL LOW (ref 12.0–15.0)
MCH: 26.9 pg (ref 26.0–34.0)
MCHC: 33.5 g/dL (ref 30.0–36.0)
MCV: 80.2 fL (ref 80.0–100.0)
Platelets: 230 10*3/uL (ref 150–400)
RBC: 4.09 MIL/uL (ref 3.87–5.11)
RDW: 12.9 % (ref 11.5–15.5)
WBC: 5.5 10*3/uL (ref 4.0–10.5)
nRBC: 0 % (ref 0.0–0.2)

## 2023-05-29 LAB — T3, FREE: T3, Free: 3.5 pg/mL (ref 2.0–4.4)

## 2023-05-29 MED ORDER — VITAFOL GUMMIES 3.33-0.333-34.8 MG PO CHEW
1.0000 | CHEWABLE_TABLET | Freq: Every day | ORAL | 5 refills | Status: AC
Start: 2023-05-29 — End: ?

## 2023-05-29 MED ORDER — POTASSIUM CHLORIDE 10 MEQ/100ML IV SOLN
10.0000 meq | INTRAVENOUS | Status: AC
  Administered 2023-05-29 (×4): 10 meq via INTRAVENOUS
  Filled 2023-05-29 (×3): qty 100

## 2023-05-29 MED ORDER — ONDANSETRON 8 MG PO TBDP
8.0000 mg | ORAL_TABLET | Freq: Three times a day (TID) | ORAL | 0 refills | Status: DC | PRN
  Filled 2023-05-29: qty 30, 10d supply, fill #0

## 2023-05-29 MED ORDER — PROMETHAZINE HCL 12.5 MG PO TABS
12.5000 mg | ORAL_TABLET | ORAL | 0 refills | Status: DC | PRN
  Filled 2023-05-29: qty 30, 3d supply, fill #0

## 2023-05-29 MED ORDER — SCOPOLAMINE 1 MG/3DAYS TD PT72
1.0000 | MEDICATED_PATCH | TRANSDERMAL | 0 refills | Status: DC
  Filled 2023-05-29: qty 4, 12d supply, fill #0

## 2023-05-29 MED ORDER — METOCLOPRAMIDE HCL 10 MG PO TABS
10.0000 mg | ORAL_TABLET | Freq: Four times a day (QID) | ORAL | 1 refills | Status: DC
  Filled 2023-05-29: qty 30, 8d supply, fill #0
  Filled 2023-06-26: qty 30, 8d supply, fill #1

## 2023-05-29 MED ORDER — POTASSIUM CHLORIDE 20 MEQ PO PACK
40.0000 meq | PACK | Freq: Once | ORAL | Status: AC
  Administered 2023-05-29: 40 meq via ORAL
  Filled 2023-05-29: qty 2

## 2023-05-29 NOTE — Progress Notes (Signed)
FACULTY PRACTICE ANTEPARTUM PROGRESS NOTE  [name] [name] is a 32 y.o. G2P1001 at [redacted]w[redacted]d who is admitted for hyperemesis gravidarum.  Estimated Date of Delivery: 12/04/23 Fetal presentation is  variable .  Length of Stay:  2 Days. Admitted 05/27/2023  Subjective: Pt seen this morning.  She states the nausea has improved and she is tolerating some regular diet  She denies uterine contractions, denies bleeding and leaking of fluid per vagina.  Vitals:  Blood pressure 101/60, pulse 83, temperature 99.1 F (37.3 C), temperature source Oral, resp. rate 18, height 5\' 2"  (1.575 m), weight 79.2 kg, last menstrual period 12/07/2022, SpO2 100 %. Physical Examination: CONSTITUTIONAL: Well-developed, well-nourished female in no acute distress.  HENT:  Normocephalic, atraumatic, External right and left ear normal. Oropharynx is clear and moist EYES: Conjunctivae and EOM are normal.   NECK: Normal range of motion, supple, no masses. SKIN: Skin is warm and dry. No rash noted. Not diaphoretic. No erythema. No pallor. NEUROLGIC: Alert and oriented to person, place, and time. Normal reflexes, muscle tone coordination. No cranial nerve deficit noted. PSYCHIATRIC: Normal mood and affect. Normal behavior. Normal judgment and thought content. CARDIOVASCULAR: Normal heart rate noted, regular rhythm RESPIRATORY: Effort and breath sounds normal, no problems with respiration noted MUSCULOSKELETAL: Normal range of motion. No edema and no tenderness. ABDOMEN: Soft, nontender, nondistended, gravid. CERVIX: deferred  Fetal monitoring: FHR: 153  Results for orders placed or performed during the hospital encounter of 05/27/23 (from the past 48 hour(s))  CBC     Status: Abnormal   Collection Time: 05/27/23 12:00 PM  Result Value Ref Range   WBC 10.4 4.0 - 10.5 K/uL   RBC 5.32 (H) 3.87 - 5.11 MIL/uL   Hemoglobin 14.0 12.0 - 15.0 g/dL   HCT 16.1 09.6 - 04.5 %   MCV 79.9 (L) 80.0 - 100.0 fL   MCH 26.3 26.0 - 34.0 pg    MCHC 32.9 30.0 - 36.0 g/dL   RDW 40.9 81.1 - 91.4 %   Platelets 350 150 - 400 K/uL   nRBC 0.0 0.0 - 0.2 %    Comment: Performed at [facility], [address] [place], [place] [address]  Comprehensive metabolic panel     Status: Abnormal   Collection Time: 05/27/23 12:00 PM  Result Value Ref Range   Sodium 134 (L) 135 - 145 mmol/L   Potassium 2.9 (L) 3.5 - 5.1 mmol/L   Chloride 100 98 - 111 mmol/L   CO2 21 (L) 22 - 32 mmol/L   Glucose, Bld 103 (H) 70 - 99 mg/dL    Comment: Glucose reference range applies only to samples taken after fasting for at least 8 hours.   BUN 12 6 - 20 mg/dL   Creatinine, Ser 6.21 0.44 - 1.00 mg/dL   Calcium 9.8 8.9 - 30.8 mg/dL   Total Protein 8.2 (H) 6.5 - 8.1 g/dL   Albumin 4.0 3.5 - 5.0 g/dL   AST 27 15 - 41 U/L   ALT 33 0 - 44 U/L   Alkaline Phosphatase 62 38 - 126 U/L   Total Bilirubin 1.7 (H) 0.3 - 1.2 mg/dL   GFR, Estimated >65 >78 mL/min    Comment: (NOTE) Calculated using the CKD-EPI Creatinine Equation (2021)    Anion gap 13 5 - 15    Comment: Performed at [facility], [address] [place], [place] [address]  Lipase, blood     Status: None   Collection Time: 05/27/23 12:00 PM  Result Value Ref Range   Lipase 43 11 - 51 U/L    Comment: Performed at [facility], [address] [place], [place] [address]  Troponin I (High Sensitivity)     Status: None   Collection Time: 05/27/23 12:00 PM  Result Value Ref Range   Troponin I (High Sensitivity) 3 <18 ng/L    Comment: (NOTE) Elevated high sensitivity troponin I (hsTnI) values and significant  changes across serial measurements may suggest ACS but many other  chronic and acute conditions are known to elevate hsTnI results.  Refer to the "Links" section for chest pain algorithms and additional  guidance. Performed at [facility], [address] [place], [place] [address]   Magnesium     Status: None    Collection Time: 05/27/23 12:00 PM  Result Value Ref Range   Magnesium 2.1 1.7 - 2.4 mg/dL    Comment: Performed at [facility], [address], [place], [place] [address]  hCG, quantitative, pregnancy     Status: Abnormal   Collection Time: 05/27/23 12:00 PM  Result Value Ref Range   hCG, Beta Chain, Quant, S 142,124 (H) <5 mIU/mL    Comment:          GEST. AGE      CONC.  (mIU/mL)   <=1 WEEK        5 - 50     2 WEEKS       50 - 500     3 WEEKS       100 - 10,000     4 WEEKS     1,000 - 30,000     5 WEEKS     3,500 - 115,000   6-8 WEEKS     12,000 - 270,000    12 WEEKS     15,000 - 220,000        FEMALE AND NON-PREGNANT FEMALE:     LESS THAN 5 mIU/mL Performed at [facility], [address], [place], [place] [address]   TSH     Status: Abnormal   Collection Time: 05/27/23 12:00 PM  Result Value Ref Range   TSH 0.038 (L) 0.350 - 4.500 uIU/mL    Comment: Performed by a 3rd Generation assay with a functional sensitivity of <=0.01 uIU/mL. Performed at [facility], [address], [place], [place] [address]   Urinalysis, Routine w reflex microscopic -Urine, Clean Catch     Status: Abnormal   Collection Time: 05/27/23  5:24 PM  Result Value Ref Range   Color, Urine Durinda (A) YELLOW    Comment: BIOCHEMICALS MAY BE AFFECTED BY COLOR   APPearance HAZY (A) CLEAR   Specific Gravity, Urine 1.024 1.005 - 1.030   pH 6.0 5.0 - 8.0   Glucose, UA NEGATIVE NEGATIVE mg/dL   Hgb urine dipstick NEGATIVE NEGATIVE   Bilirubin Urine NEGATIVE NEGATIVE   Ketones, ur 80 (A) NEGATIVE mg/dL   Protein, ur 130 (A) NEGATIVE mg/dL   Nitrite NEGATIVE NEGATIVE   Leukocytes,Ua NEGATIVE NEGATIVE   RBC / HPF 0-5 0 - 5 RBC/hpf   WBC, UA 0-5 0 - 5 WBC/hpf   Bacteria, UA NONE SEEN NONE SEEN   Squamous Epithelial / HPF 0-5 0 - 5 /HPF   Mucus PRESENT     Comment: Performed at [facility], [address], [place], [place] [address]   Rapid urine drug screen (hospital performed)     Status: Abnormal  Collection Time: 05/27/23  5:24 PM  Result Value Ref Range   Opiates POSITIVE (A) NONE DETECTED   Cocaine NONE DETECTED NONE DETECTED   Benzodiazepines NONE DETECTED NONE DETECTED   Amphetamines NONE DETECTED NONE DETECTED   Tetrahydrocannabinol NONE DETECTED NONE DETECTED   Barbiturates NONE DETECTED NONE DETECTED    Comment: (NOTE) DRUG SCREEN FOR MEDICAL PURPOSES ONLY.  IF CONFIRMATION IS NEEDED FOR ANY PURPOSE, NOTIFY LAB WITHIN 5 DAYS.  LOWEST DETECTABLE LIMITS FOR URINE DRUG SCREEN Drug Class                     Cutoff (ng/mL) Amphetamine and metabolites    1000 Barbiturate and metabolites    200 Benzodiazepine                 200 Opiates and metabolites        300 Cocaine and metabolites        300 THC                            50 Performed at [facility], [address], [place], [place] [address]   Type and screen [facility]     Status: None   Collection Time: 05/28/23  4:39 AM  Result Value Ref Range   ABO/RH(D) O POS    Antibody Screen NEG    Sample Expiration      05/31/2023,2359 Performed at [facility], [address], [place], [place] [address]   CBC     Status: Abnormal   Collection Time: 05/28/23  4:41 AM  Result Value Ref Range   WBC 8.5 4.0 - 10.5 K/uL   RBC 4.26 3.87 - 5.11 MIL/uL   Hemoglobin 11.0 (L) 12.0 - 15.0 g/dL   HCT 29.5 (L) 28.4 - 13.2 %   MCV 80.5 80.0 - 100.0 fL   MCH 25.8 (L) 26.0 - 34.0 pg   MCHC 32.1 30.0 - 36.0 g/dL   RDW 44.0 10.2 - 72.5 %   Platelets 267 150 - 400 K/uL   nRBC 0.0 0.0 - 0.2 %    Comment: Performed at [facility], [address], [place], [place] [address]  Comprehensive metabolic panel     Status: Abnormal   Collection Time: 05/28/23  4:41 AM  Result Value Ref Range   Sodium 133 (L) 135 - 145 mmol/L   Potassium 3.5 3.5 - 5.1 mmol/L   Chloride 103 98 - 111 mmol/L   CO2 21 (L) 22 - 32  mmol/L   Glucose, Bld 85 70 - 99 mg/dL    Comment: Glucose reference range applies only to samples taken after fasting for at least 8 hours.   BUN <5 (L) 6 - 20 mg/dL   Creatinine, Ser 0.34 0.44 - 1.00 mg/dL   Calcium 8.5 (L) 8.9 - 10.3 mg/dL   Total Protein 6.0 (L) 6.5 - 8.1 g/dL   Albumin 2.8 (L) 3.5 - 5.0 g/dL   AST 22 15 - 41 U/L   ALT 27 0 - 44 U/L   Alkaline Phosphatase 47 38 - 126 U/L   Total Bilirubin 1.8 (H) 0.3 - 1.2 mg/dL   GFR, Estimated >74 >25 mL/min    Comment: (NOTE) Calculated using the CKD-EPI Creatinine Equation (2021)    Anion gap 9 5 - 15    Comment: Performed at [facility], [address] [place],  [place] [address]  Magnesium     Status: None   Collection Time: 05/28/23  4:41 AM  Result Value Ref Range   Magnesium 1.8 1.7 - 2.4 mg/dL    Comment: Performed at [facility], [address], [place], [place] [address]  T3, free     Status: None   Collection Time: 05/28/23  4:41 AM  Result Value Ref Range   T3, Free 3.5 2.0 - 4.4 pg/mL    Comment: (NOTE) Performed At: [facility] [address] [place], [place] [id number] Jolene Schimke MD [id number]   T4, free     Status: Abnormal   Collection Time: 05/28/23  4:43 AM  Result Value Ref Range   Free T4 1.50 (H) 0.61 - 1.12 ng/dL    Comment: (NOTE) Biotin ingestion may interfere with free T4 tests. If the results are inconsistent with the TSH level, previous test results, or the clinical presentation, then consider biotin interference. If needed, order repeat testing after stopping biotin. Performed at [facility], [address], [place], [place] [address]   CBC     Status: Abnormal   Collection Time: 05/29/23  4:59 AM  Result Value Ref Range   WBC 5.5 4.0 - 10.5 K/uL   RBC 4.09 3.87 - 5.11 MIL/uL   Hemoglobin 11.0 (L) 12.0 - 15.0 g/dL   HCT 13.2 (L) 44.0 - 10.2 %   MCV 80.2 80.0 - 100.0 fL   MCH 26.9 26.0 - 34.0 pg   MCHC 33.5 30.0 - 36.0 g/dL   RDW 72.5 36.6 -  44.0 %   Platelets 230 150 - 400 K/uL   nRBC 0.0 0.0 - 0.2 %    Comment: Performed at [facility], [address] [place], [place] [address]  Comprehensive metabolic panel     Status: Abnormal   Collection Time: 05/29/23  4:59 AM  Result Value Ref Range   Sodium 133 (L) 135 - 145 mmol/L   Potassium 3.0 (L) 3.5 - 5.1 mmol/L   Chloride 100 98 - 111 mmol/L   CO2 24 22 - 32 mmol/L   Glucose, Bld 89 70 - 99 mg/dL    Comment: Glucose reference range applies only to samples taken after fasting for at least 8 hours.   BUN <5 (L) 6 - 20 mg/dL   Creatinine, Ser 5.95 0.44 - 1.00 mg/dL   Calcium 8.7 (L) 8.9 - 10.3 mg/dL   Total Protein 5.8 (L) 6.5 - 8.1 g/dL   Albumin 2.8 (L) 3.5 - 5.0 g/dL   AST 27 15 - 41 U/L   ALT 31 0 - 44 U/L   Alkaline Phosphatase 48 38 - 126 U/L   Total Bilirubin 1.5 (H) 0.3 - 1.2 mg/dL   GFR, Estimated >63 >87 mL/min    Comment: (NOTE) Calculated using the CKD-EPI Creatinine Equation (2021)    Anion gap 9 5 - 15    Comment: Performed at [facility], [address], [place], [place] [address]  Magnesium     Status: None   Collection Time: 05/29/23  4:59 AM  Result Value Ref Range   Magnesium 1.7 1.7 - 2.4 mg/dL    Comment: Performed at [facility], [address], [place], [place] [address]    I have reviewed the patient's current medications.  ASSESSMENT: Principal Problem:   Hyperemesis gravidarum with electrolyte imbalance Active Problems:   Hypokalemia   [redacted] weeks gestation of pregnancy  PLAN: Hyperemesis gravidarum:  anti emetics seem to be effective.  She is now tolerating limited regular diet. If stable can likely d/c home with BRAT diet  Hypokalemia.  Can replace, but trying to use oral route if possible since pt does not do well with pills.  Discussed with OB pharmacist, there is a potassium powder.  If pt tolerates, will d/c home and initiate prenatal care in 1-2 weeks   Continue routine antenatal care.   Mariel Aloe, MD [facility] Faculty Attending, [facility] 05/29/2023 11:16 AM

## 2023-05-29 NOTE — Discharge Summary (Signed)
Antenatal Physician Discharge Summary  Patient ID: [name] [name] MRN: [id number] DOB/AGE: May 22, 1991 31 y.o.  Admit date: 05/27/2023 Discharge date: 05/29/2023  Admission Diagnoses: nausea and vomiting, abdominal pain, hypokalemia  Discharge Diagnoses: nausea/vomiting, abdominal pain , hypokalemia  Prenatal Procedures: none  Consults: n/a  Hospital Course:  [name] [name] is a 32 y.o. G2P1001 with IUP at [redacted]w[redacted]d admitted for intractable nausea and vomiting with abdominal pain.  Pt received antiemetics and IV potassium repletion.  The patient's diet was gradually advanced.  On day 2 potassium was gain low and she had IV potassium given for replacement.  She was deemed stable for discharge to home with outpatient follow up.  Discharge Exam: Temp:  [98.1 F (36.7 C)-99.2 F (37.3 C)] 98.1 F (36.7 C) (07/03 1243) Pulse Rate:  [70-83] 71 (07/03 1243) Resp:  [18-19] 18 (07/03 1243) BP: (95-105)/(48-69) 105/53 (07/03 1243) SpO2:  [100 %] 100 % (07/03 0857) Weight:  [79.2 kg] 79.2 kg (07/03 0550) Physical Examination: CONSTITUTIONAL: Well-developed, well-nourished female in no acute distress.  HENT:  Normocephalic, atraumatic, External right and left ear normal. Oropharynx is clear and moist EYES: Conjunctivae and EOM are normal.  NECK: Normal range of motion, supple, no masses SKIN: Skin is warm and dry. No rash noted. Not diaphoretic. No erythema. No pallor. NEUROLGIC: Alert and oriented to person, place, and time. Normal reflexes, muscle tone coordination. No cranial nerve deficit noted. PSYCHIATRIC: Normal mood and affect. Normal behavior. Normal judgment and thought content. CARDIOVASCULAR: Normal heart rate noted, regular rhythm RESPIRATORY: Effort and breath sounds normal, no problems with respiration noted MUSCULOSKELETAL: Normal range of motion. No edema and no tenderness. 2+ distal pulses. ABDOMEN: Soft, nontender, nondistended, gravid. CERVIX:  deferred  Significant  Diagnostic Studies:  Results for orders placed or performed during the hospital encounter of 05/27/23 (from the past 168 hour(s))  CBC   Collection Time: 05/27/23 12:00 PM  Result Value Ref Range   WBC 10.4 4.0 - 10.5 K/uL   RBC 5.32 (H) 3.87 - 5.11 MIL/uL   Hemoglobin 14.0 12.0 - 15.0 g/dL   HCT 40.9 81.1 - 91.4 %   MCV 79.9 (L) 80.0 - 100.0 fL   MCH 26.3 26.0 - 34.0 pg   MCHC 32.9 30.0 - 36.0 g/dL   RDW 78.2 95.6 - 21.3 %   Platelets 350 150 - 400 K/uL   nRBC 0.0 0.0 - 0.2 %  Comprehensive metabolic panel   Collection Time: 05/27/23 12:00 PM  Result Value Ref Range   Sodium 134 (L) 135 - 145 mmol/L   Potassium 2.9 (L) 3.5 - 5.1 mmol/L   Chloride 100 98 - 111 mmol/L   CO2 21 (L) 22 - 32 mmol/L   Glucose, Bld 103 (H) 70 - 99 mg/dL   BUN 12 6 - 20 mg/dL   Creatinine, Ser 0.86 0.44 - 1.00 mg/dL   Calcium 9.8 8.9 - 57.8 mg/dL   Total Protein 8.2 (H) 6.5 - 8.1 g/dL   Albumin 4.0 3.5 - 5.0 g/dL   AST 27 15 - 41 U/L   ALT 33 0 - 44 U/L   Alkaline Phosphatase 62 38 - 126 U/L   Total Bilirubin 1.7 (H) 0.3 - 1.2 mg/dL   GFR, Estimated >46 >96 mL/min   Anion gap 13 5 - 15  Lipase, blood   Collection Time: 05/27/23 12:00 PM  Result Value Ref Range   Lipase 43 11 - 51 U/L  Magnesium   Collection Time: 05/27/23 12:00 PM  Result Value  Ref Range   Magnesium 2.1 1.7 - 2.4 mg/dL  hCG, quantitative, pregnancy   Collection Time: 05/27/23 12:00 PM  Result Value Ref Range   hCG, Beta Chain, Quant, S 142,124 (H) <5 mIU/mL  TSH   Collection Time: 05/27/23 12:00 PM  Result Value Ref Range   TSH 0.038 (L) 0.350 - 4.500 uIU/mL  Troponin I (High Sensitivity)   Collection Time: 05/27/23 12:00 PM  Result Value Ref Range   Troponin I (High Sensitivity) 3 <18 ng/L  Urinalysis, Routine w reflex microscopic -Urine, Clean Catch   Collection Time: 05/27/23  5:24 PM  Result Value Ref Range   Color, Urine Jamiesha (A) YELLOW   APPearance HAZY (A) CLEAR   Specific Gravity, Urine 1.024 1.005 - 1.030    pH 6.0 5.0 - 8.0   Glucose, UA NEGATIVE NEGATIVE mg/dL   Hgb urine dipstick NEGATIVE NEGATIVE   Bilirubin Urine NEGATIVE NEGATIVE   Ketones, ur 80 (A) NEGATIVE mg/dL   Protein, ur 161 (A) NEGATIVE mg/dL   Nitrite NEGATIVE NEGATIVE   Leukocytes,Ua NEGATIVE NEGATIVE   RBC / HPF 0-5 0 - 5 RBC/hpf   WBC, UA 0-5 0 - 5 WBC/hpf   Bacteria, UA NONE SEEN NONE SEEN   Squamous Epithelial / HPF 0-5 0 - 5 /HPF   Mucus PRESENT   Rapid urine drug screen (hospital performed)   Collection Time: 05/27/23  5:24 PM  Result Value Ref Range   Opiates POSITIVE (A) NONE DETECTED   Cocaine NONE DETECTED NONE DETECTED   Benzodiazepines NONE DETECTED NONE DETECTED   Amphetamines NONE DETECTED NONE DETECTED   Tetrahydrocannabinol NONE DETECTED NONE DETECTED   Barbiturates NONE DETECTED NONE DETECTED  Type and screen [facility]   Collection Time: 05/28/23  4:39 AM  Result Value Ref Range   ABO/RH(D) O POS    Antibody Screen NEG    Sample Expiration      05/31/2023,2359 Performed at [facility], [address], [place], [place] [address]   CBC   Collection Time: 05/28/23  4:41 AM  Result Value Ref Range   WBC 8.5 4.0 - 10.5 K/uL   RBC 4.26 3.87 - 5.11 MIL/uL   Hemoglobin 11.0 (L) 12.0 - 15.0 g/dL   HCT 54.0 (L) 98.1 - 19.1 %   MCV 80.5 80.0 - 100.0 fL   MCH 25.8 (L) 26.0 - 34.0 pg   MCHC 32.1 30.0 - 36.0 g/dL   RDW 47.8 29.5 - 62.1 %   Platelets 267 150 - 400 K/uL   nRBC 0.0 0.0 - 0.2 %  Comprehensive metabolic panel   Collection Time: 05/28/23  4:41 AM  Result Value Ref Range   Sodium 133 (L) 135 - 145 mmol/L   Potassium 3.5 3.5 - 5.1 mmol/L   Chloride 103 98 - 111 mmol/L   CO2 21 (L) 22 - 32 mmol/L   Glucose, Bld 85 70 - 99 mg/dL   BUN <5 (L) 6 - 20 mg/dL   Creatinine, Ser 3.08 0.44 - 1.00 mg/dL   Calcium 8.5 (L) 8.9 - 10.3 mg/dL   Total Protein 6.0 (L) 6.5 - 8.1 g/dL   Albumin 2.8 (L) 3.5 - 5.0 g/dL   AST 22 15 - 41 U/L   ALT 27 0 - 44 U/L   Alkaline  Phosphatase 47 38 - 126 U/L   Total Bilirubin 1.8 (H) 0.3 - 1.2 mg/dL   GFR, Estimated >65 >78 mL/min   Anion gap 9 5 -  15  Magnesium   Collection Time: 05/28/23  4:41 AM  Result Value Ref Range   Magnesium 1.8 1.7 - 2.4 mg/dL  T3, free   Collection Time: 05/28/23  4:41 AM  Result Value Ref Range   T3, Free 3.5 2.0 - 4.4 pg/mL  T4, free   Collection Time: 05/28/23  4:43 AM  Result Value Ref Range   Free T4 1.50 (H) 0.61 - 1.12 ng/dL  CBC   Collection Time: 05/29/23  4:59 AM  Result Value Ref Range   WBC 5.5 4.0 - 10.5 K/uL   RBC 4.09 3.87 - 5.11 MIL/uL   Hemoglobin 11.0 (L) 12.0 - 15.0 g/dL   HCT 16.1 (L) 09.6 - 04.5 %   MCV 80.2 80.0 - 100.0 fL   MCH 26.9 26.0 - 34.0 pg   MCHC 33.5 30.0 - 36.0 g/dL   RDW 40.9 81.1 - 91.4 %   Platelets 230 150 - 400 K/uL   nRBC 0.0 0.0 - 0.2 %  Comprehensive metabolic panel   Collection Time: 05/29/23  4:59 AM  Result Value Ref Range   Sodium 133 (L) 135 - 145 mmol/L   Potassium 3.0 (L) 3.5 - 5.1 mmol/L   Chloride 100 98 - 111 mmol/L   CO2 24 22 - 32 mmol/L   Glucose, Bld 89 70 - 99 mg/dL   BUN <5 (L) 6 - 20 mg/dL   Creatinine, Ser 7.82 0.44 - 1.00 mg/dL   Calcium 8.7 (L) 8.9 - 10.3 mg/dL   Total Protein 5.8 (L) 6.5 - 8.1 g/dL   Albumin 2.8 (L) 3.5 - 5.0 g/dL   AST 27 15 - 41 U/L   ALT 31 0 - 44 U/L   Alkaline Phosphatase 48 38 - 126 U/L   Total Bilirubin 1.5 (H) 0.3 - 1.2 mg/dL   GFR, Estimated >95 >62 mL/min   Anion gap 9 5 - 15  Magnesium   Collection Time: 05/29/23  4:59 AM  Result Value Ref Range   Magnesium 1.7 1.7 - 2.4 mg/dL  Results for orders placed or performed during the hospital encounter of 05/24/23 (from the past 168 hour(s))  CBC with Differential   Collection Time: 05/24/23  2:41 PM  Result Value Ref Range   WBC 7.4 4.0 - 10.5 K/uL   RBC 4.68 3.87 - 5.11 MIL/uL   Hemoglobin 12.2 12.0 - 15.0 g/dL   HCT 13.0 86.5 - 78.4 %   MCV 82.1 80.0 - 100.0 fL   MCH 26.1 26.0 - 34.0 pg   MCHC 31.8 30.0 - 36.0 g/dL    RDW 69.6 29.5 - 28.4 %   Platelets 279 150 - 400 K/uL   nRBC 0.0 0.0 - 0.2 %   Neutrophils Relative % 74 %   Neutro Abs 5.5 1.7 - 7.7 K/uL   Lymphocytes Relative 17 %   Lymphs Abs 1.3 0.7 - 4.0 K/uL   Monocytes Relative 7 %   Monocytes Absolute 0.5 0.1 - 1.0 K/uL   Eosinophils Relative 1 %   Eosinophils Absolute 0.1 0.0 - 0.5 K/uL   Basophils Relative 1 %   Basophils Absolute 0.0 0.0 - 0.1 K/uL   Immature Granulocytes 0 %   Abs Immature Granulocytes 0.02 0.00 - 0.07 K/uL  Comprehensive metabolic panel   Collection Time: 05/24/23  2:41 PM  Result Value Ref Range   Sodium 137 135 - 145 mmol/L   Potassium 3.2 (L) 3.5 - 5.1 mmol/L   Chloride 104 98 - 111  mmol/L   CO2 24 22 - 32 mmol/L   Glucose, Bld 93 70 - 99 mg/dL   BUN 13 6 - 20 mg/dL   Creatinine, Ser 1.61 0.44 - 1.00 mg/dL   Calcium 9.4 8.9 - 09.6 mg/dL   Total Protein 8.2 (H) 6.5 - 8.1 g/dL   Albumin 4.0 3.5 - 5.0 g/dL   AST 23 15 - 41 U/L   ALT 23 0 - 44 U/L   Alkaline Phosphatase 59 38 - 126 U/L   Total Bilirubin 1.1 0.3 - 1.2 mg/dL   GFR, Estimated >04 >54 mL/min   Anion gap 9 5 - 15  Lipase, blood   Collection Time: 05/24/23  2:41 PM  Result Value Ref Range   Lipase 28 11 - 51 U/L  Troponin I (High Sensitivity)   Collection Time: 05/24/23  2:41 PM  Result Value Ref Range   Troponin I (High Sensitivity) 2 <18 ng/L  Urinalysis, Routine w reflex microscopic -Urine, Clean Catch   Collection Time: 05/24/23  5:17 PM  Result Value Ref Range   Color, Urine Kiyana (A) YELLOW   APPearance HAZY (A) CLEAR   Specific Gravity, Urine 1.025 1.005 - 1.030   pH 6.0 5.0 - 8.0   Glucose, UA NEGATIVE NEGATIVE mg/dL   Hgb urine dipstick NEGATIVE NEGATIVE   Bilirubin Urine NEGATIVE NEGATIVE   Ketones, ur 80 (A) NEGATIVE mg/dL   Protein, ur 098 (A) NEGATIVE mg/dL   Nitrite NEGATIVE NEGATIVE   Leukocytes,Ua TRACE (A) NEGATIVE   RBC / HPF 0-5 0 - 5 RBC/hpf   WBC, UA 6-10 0 - 5 WBC/hpf   Bacteria, UA RARE (A) NONE SEEN   Squamous  Epithelial / HPF 6-10 0 - 5 /HPF   Mucus PRESENT    US OB Comp Less 14 Wks  Result Date: 05/27/2023 CLINICAL DATA:  Elevated HCG for gestational age. EXAM: OBSTETRIC <14 WK ULTRASOUND TECHNIQUE: Transabdominal ultrasound was performed for evaluation of the gestation as well as the maternal uterus and adnexal regions. COMPARISON:  None Available. FINDINGS: Intrauterine gestational sac: Single intrauterine gestational sac. Yolk sac:  Not seen Embryo:  Present Cardiac Activity: Detected Heart Rate: 151 bpm CRL:   63 mm   12 w 5 d                  [place] EDC: 12/04/2023 Subchorionic hemorrhage:  None visualized. Maternal uterus/adnexae: The maternal ovaries are unremarkable. No significant free fluid within the pelvis. IMPRESSION: Single live intrauterine pregnancy with an estimated gestational age of [redacted] weeks, 5 days. Electronically Signed   By: Elgie Collard M.D.   On: 05/27/2023 21:54   DG Chest Portable 1 View  Result Date: 05/22/2023 CLINICAL DATA:  Chest pain and headache. EXAM: PORTABLE CHEST 1 VIEW COMPARISON:  May 25, 2018 FINDINGS: The heart size and mediastinal contours are within normal limits. Both lungs are clear. The visualized skeletal structures are unremarkable. IMPRESSION: No active disease. Electronically Signed   By: Aram Candela M.D.   On: 05/22/2023 02:25   CT Head Wo Contrast  Result Date: 05/13/2023 CLINICAL DATA:  Trauma EXAM: CT HEAD WITHOUT CONTRAST TECHNIQUE: Contiguous axial images were obtained from the base of the skull through the vertex without intravenous contrast. RADIATION DOSE REDUCTION: This exam was performed according to the departmental dose-optimization program which includes automated exposure control, adjustment of the mA and/or kV according to patient size and/or use of iterative reconstruction technique. COMPARISON:  Head CT 09/01/2017 FINDINGS: Brain: No evidence  of acute infarction, hemorrhage, hydrocephalus, extra-axial collection or mass lesion/mass  effect. Vascular: No hyperdense vessel or unexpected calcification. Skull: Normal. Negative for fracture or focal lesion. Sinuses/Orbits: No acute finding. Other: None. IMPRESSION: No acute intracranial pathology. Electronically Signed   By: Darliss Cheney M.D.   On: 05/13/2023 16:39    No future appointments.  Discharge Condition: Stable  Discharge disposition: 01-Home or Self Care       Discharge Instructions     Discharge activity:  No Restrictions   Complete by: As directed    Discharge diet:   Complete by: As directed    Bland diet   No sexual activity restrictions   Complete by: As directed    Notify physician for a general feeling that "something is not right"   Complete by: As directed    Notify physician for increase or change in vaginal discharge   Complete by: As directed    Notify physician for intestinal cramps, with or without diarrhea, sometimes described as "gas pain"   Complete by: As directed    Notify physician for leaking of fluid   Complete by: As directed    Notify physician for low, dull backache, unrelieved by heat or Tylenol   Complete by: As directed    Notify physician for menstrual like cramps   Complete by: As directed    Notify physician for pelvic pressure   Complete by: As directed    Notify physician for uterine contractions.  These may be painless and feel like the uterus is tightening or the baby is  "balling up"   Complete by: As directed    Notify physician for vaginal bleeding   Complete by: As directed    PRETERM LABOR:  Includes any of the follwing symptoms that occur between 20 - [redacted] weeks gestation.  If these symptoms are not stopped, preterm labor can result in preterm delivery, placing your baby at risk   Complete by: As directed       Allergies as of 05/29/2023       Reactions   Iohexol Hives, Shortness Of Breath, Itching, Nausea And Vomiting, Swelling, Other (See Comments)   Also caused chest pain PER DR BARRY, PRE MED  PROTOCOL SHOULD BE ADMINISTERED PRIOR TO SCANS IN THE FUTURE.  PT HAD A SIGNIFICANT REACTION.  [facility] 08/18/17 Patient had a reaction of SOB, swelling even though premedicated. Physician states no further contrast imaging in the future.   Peanut-containing Drug Products Anaphylaxis, Swelling   Facial swelling   Other Swelling   Tree nuts cause facial swelling   Sulfa Antibiotics Swelling   Facial swelling (reported by [facility] 2013)   Iodinated Contrast Media Rash        Medication List     STOP taking these medications    Aimovig (140 MG Dose) 70 MG/ML Soaj Generic drug: Erenumab-aooe   DEPO-PROVERA IM   HAIR/SKIN/NAILS/BIOTIN PO   lidocaine 4 %   Melatonin 5 MG Chew   meloxicam 7.5 MG tablet Commonly known as: MOBIC   methocarbamol 500 MG tablet Commonly known as: Robaxin   mupirocin ointment 2 % Commonly known as: BACTROBAN   naproxen 500 MG tablet Commonly known as: NAPROSYN   Prenatal Vitamin 27-0.8 MG Tabs       TAKE these medications    acetaminophen 650 MG CR tablet Commonly known as: TYLENOL Take 1,300 mg by mouth every 8 (eight) hours as needed for pain.   albuterol 108 (90 Base) MCG/ACT inhaler Commonly  known as: VENTOLIN HFA Inhale 2 puffs into the lungs every 6 (six) hours as needed for wheezing or shortness of breath.   cetirizine 10 MG tablet Commonly known as: ZyrTEC Allergy Take 1 tablet (10 mg total) by mouth daily.   EPINEPHrine 0.3 mg/0.3 mL Soaj injection Commonly known as: EPI-PEN Inject 0.3 mLs (0.3 mg total) into the muscle as needed for anaphylaxis.   erythromycin ophthalmic ointment Place a 1/2 inch ribbon of ointment into the lower eyelid.   famotidine 40 MG tablet Commonly known as: PEPCID Take 1 tablet (40 mg total) by mouth at bedtime.   metoCLOPramide 10 MG tablet Commonly known as: REGLAN Take 1 tablet (10 mg total) by mouth every 6 (six) hours.   olopatadine 0.1 % ophthalmic solution Commonly known as:  Pataday Place 1 drop into both eyes 2 (two) times daily.   omeprazole 20 MG capsule Commonly known as: PRILOSEC Take 1 capsule (20 mg total) by mouth daily.   ondansetron 8 MG disintegrating tablet Commonly known as: ZOFRAN-ODT Take 1 tablet (8 mg total) by mouth every 8 (eight) hours as needed for nausea What changed:  how much to take how to take this when to take this reasons to take this additional instructions Another medication with the same name was removed. Continue taking this medication, and follow the directions you see here.   potassium chloride SA 20 MEQ tablet Commonly known as: KLOR-CON M One po bid x 3 days, then one po once a day   promethazine 12.5 MG tablet Commonly known as: PHENERGAN Take 1-2 tablets (12.5-25 mg total) by mouth every 4 (four) hours as needed for nausea or vomiting. What changed:  medication strength how much to take when to take this   scopolamine 1 MG/3DAYS Commonly known as: TRANSDERM-SCOP Place 1 patch (1.5 mg total) onto the skin every 3 (three) days. Start taking on: May 30, 2023   sucralfate 1 GM/10ML suspension Commonly known as: Carafate Take 10 mLs (1 g total) by mouth 4 (four) times daily -  with meals and at bedtime. What changed: Another medication with the same name was removed. Continue taking this medication, and follow the directions you see here.   Vitafol Gummies 3.33-0.333-34.8 MG Chew Chew 1 tablet by mouth daily.        Follow-up Information     [facility]. Schedule an appointment as soon as possible for a visit in 2 week(s).   Specialty: Obstetrics and Gynecology Why: new OB visit Contact information: [address] [place] [address] [phone]              Will recheck potassium at new OB visit  Total discharge time: 20 minutes   Signed: Warden Fillers M.D. 05/29/2023, 4:40 PM

## 2023-05-31 LAB — THYROID STIMULATING IMMUNOGLOBULIN: Thyroid Stimulating Immunoglob: <0.1 IU/L (ref 0.00–0.55)

## 2023-05-31 LAB — THYROTROPIN RECEPTOR AUTOABS: Thyrotropin Receptor Ab: <1.1 IU/L (ref 0.00–1.75)

## 2023-06-03 ENCOUNTER — Telehealth: Payer: Self-pay | Admitting: Obstetrics and Gynecology

## 2023-06-03 NOTE — Telephone Encounter (Signed)
-----   Message from Warden Fillers, MD sent at 05/29/2023  2:35 PM EDT ----- Regarding: new ob Pt needs new OB visit, currently in house with nausea, discharged soon, needs new ob visit in 1-2 weeks, please contact, she is [redacted] weeks pregnant  Donavan Foil, MD

## 2023-06-03 NOTE — Telephone Encounter (Signed)
Attempted to reach patient about scheduling an appointment to establish care. Left a voicemail message.

## 2023-06-10 ENCOUNTER — Telehealth: Payer: Self-pay | Admitting: Lactation Services

## 2023-06-10 NOTE — Telephone Encounter (Signed)
Received clarification order from [employer] for PNV. Was prescribed 1 per day, recommended dosages I TID. Clarified that patient can chew 3 per day as recommended.

## 2023-06-14 ENCOUNTER — Ambulatory Visit (INDEPENDENT_AMBULATORY_CARE_PROVIDER_SITE_OTHER): Payer: Medicaid Other | Admitting: Family Medicine

## 2023-06-14 ENCOUNTER — Other Ambulatory Visit (HOSPITAL_COMMUNITY)
Admission: RE | Admit: 2023-06-14 | Discharge: 2023-06-14 | Disposition: A | Discharge status: Home / Self Care | Payer: Medicaid Other | Source: Ambulatory Visit | Attending: Family Medicine | Admitting: Family Medicine

## 2023-06-14 ENCOUNTER — Encounter: Payer: Self-pay | Admitting: Family Medicine

## 2023-06-14 VITALS — BP 110/75 | HR 87 | Wt 178.3 lb

## 2023-06-14 DIAGNOSIS — Z124 Encounter for screening for malignant neoplasm of cervix: Secondary | ICD-10-CM | POA: Diagnosis not present

## 2023-06-14 DIAGNOSIS — O211 Hyperemesis gravidarum with metabolic disturbance: Secondary | ICD-10-CM

## 2023-06-14 DIAGNOSIS — Z3492 Encounter for supervision of normal pregnancy, unspecified, second trimester: Secondary | ICD-10-CM | POA: Insufficient documentation

## 2023-06-14 DIAGNOSIS — Z3490 Encounter for supervision of normal pregnancy, unspecified, unspecified trimester: Secondary | ICD-10-CM | POA: Insufficient documentation

## 2023-06-14 DIAGNOSIS — Z3A15 15 weeks gestation of pregnancy: Secondary | ICD-10-CM | POA: Diagnosis not present

## 2023-06-14 MED ORDER — POLYETHYLENE GLYCOL 3350 17 GM/SCOOP PO POWD: 17.0000 g | Freq: Every day | ORAL | 1 refills | Status: DC | PRN

## 2023-06-14 NOTE — Progress Notes (Signed)
Subjective:   [name] [name] is a 32 y.o. G2P1001 at [redacted]w[redacted]d by 12 wk [place] being seen today for her first obstetrical visit.  Her obstetrical history is significant for  n/a . Patient does not intend to breast feed. Pregnancy history fully reviewed.  Patient reports no complaints.  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   Sprain of MCL (medial collateral ligament) of knee 10/12/2021   Past Surgical History:  Procedure Laterality Date   BIOPSY  05/27/2018   Procedure: BIOPSY;  Surgeon: Graylin Shiver, MD;  Location: [facility] ENDOSCOPY;  Service: Endoscopy;;   ESOPHAGOGASTRODUODENOSCOPY (EGD) WITH PROPOFOL N/A 09/11/2017   Procedure: ESOPHAGOGASTRODUODENOSCOPY (EGD) WITH PROPOFOL;  Surgeon: Charlott Rakes, MD;  Location: [facility] ENDOSCOPY;  Service: Endoscopy;  Laterality: N/A;   ESOPHAGOGASTRODUODENOSCOPY (EGD) WITH PROPOFOL N/A 02/20/2018   Procedure: ESOPHAGOGASTRODUODENOSCOPY (EGD) WITH PROPOFOL;  Surgeon: Rachael Fee, MD;  Location: [facility] ENDOSCOPY;  Service: Endoscopy;  Laterality: N/A;   ESOPHAGOGASTRODUODENOSCOPY (EGD) WITH PROPOFOL N/A 05/27/2018   Procedure: ESOPHAGOGASTRODUODENOSCOPY (EGD) WITH PROPOFOL WITH CLIP PLACEMENT;  Surgeon: Graylin Shiver, MD;  Location: [facility] ENDOSCOPY;  Service:  Endoscopy;  Laterality: N/A;   INSERTION OF MESH N/A 12/26/2016   Procedure: INSERTION OF MESH;  Surgeon: Abigail Miyamoto, MD;  Location: [facility] ORS;  Service: General;  Laterality: N/A;   KNEE ARTHROSCOPY WITH MEDIAL PATELLAR FEMORAL LIGAMENT RECONSTRUCTION Left 03/09/2022   Procedure: LEFT KNEE ARTHROSCOPY WITH MEDIAL PATELLAR FEMORAL LIGAMENT RECONSTRUCTION WILL ALLOGRAFT;  Surgeon: Yolonda Kida, MD;  Location: [facility] ORS;  Service: Orthopedics;  Laterality: Left;   LAPAROSCOPY N/A 12/26/2016   Procedure: LAPAROSCOPY DIAGNOSTIC;  Surgeon: Abigail Miyamoto, MD;  Location: [facility] ORS;  Service: General;  Laterality: N/A;   UMBILICAL HERNIA REPAIR N/A 12/26/2016   Procedure: HERNIA REPAIR UMBILICAL ADULT;  Surgeon: Abigail Miyamoto, MD;  Location: [facility] ORS;  Service: General;  Laterality: N/A;   Family History  Problem Relation Age of Onset   Hypertension Mother    Stroke Mother    CAD Mother 2       MI on 10/18   Colon cancer Neg Hx    Stomach cancer Neg Hx    Rectal cancer Neg Hx    Social History   Tobacco Use   Smoking status: Former    Current packs/day: 0.00    Average packs/day: 0.3 packs/day for 4.0 years (1.0 ttl pk-yrs)    Types: Cigarettes    Start date: 2015    Quit date: 2019    Years since quitting: 5.5   Smokeless tobacco: Never  Vaping  Use   Vaping status: Never Used  Substance Use Topics   Alcohol use: Yes    Comment: rarely   Drug use: No   Allergies  Allergen Reactions   Iohexol Hives, Shortness Of Breath, Itching, Nausea And Vomiting, Swelling and Other (See Comments)    Also caused chest pain PER DR BARRY, PRE MED PROTOCOL SHOULD BE ADMINISTERED PRIOR TO SCANS IN THE FUTURE.  PT HAD A SIGNIFICANT REACTION.  [facility]  08/18/17 Patient had a reaction of SOB, swelling even though premedicated. Physician states no further contrast imaging in the future.   Peanut-Containing Drug Products Anaphylaxis and Swelling    Facial swelling   Other Swelling    Tree nuts  cause facial swelling   Sulfa Antibiotics Swelling    Facial swelling (reported by [facility] 2013)   Iodinated Contrast Media Rash   Current Outpatient Medications on File Prior to Visit  Medication Sig Dispense Refill   albuterol (VENTOLIN HFA) 108 (90 Base) MCG/ACT inhaler Inhale 2 puffs into the lungs every 6 (six) hours as needed for wheezing or shortness of breath.     EPINEPHrine 0.3 mg/0.3 mL IJ SOAJ injection Inject 0.3 mLs (0.3 mg total) into the muscle as needed for anaphylaxis. 2 each 3   metoCLOPramide (REGLAN) 10 MG tablet Take 1 tablet (10 mg total) by mouth every 6 (six) hours. 30 tablet 1   ondansetron (ZOFRAN-ODT) 8 MG disintegrating tablet Take 1 tablet (8 mg total) by mouth every 8 (eight) hours as needed for nausea 30 tablet 0   potassium chloride SA (KLOR-CON M) 20 MEQ tablet One po bid x 3 days, then one po once a day 20 tablet 0   Prenatal Vit-Fe Phos-FA-Omega (VITAFOL GUMMIES) 3.33-0.333-34.8 MG CHEW Chew 1 tablet by mouth daily. 90 tablet 5   acetaminophen (TYLENOL) 650 MG CR tablet Take 1,300 mg by mouth every 8 (eight) hours as needed for pain. (Patient not taking: Reported on 06/14/2023)     cetirizine (ZYRTEC ALLERGY) 10 MG tablet Take 1 tablet (10 mg total) by mouth daily. (Patient not taking: Reported on 06/14/2023) 30 tablet 0   erythromycin ophthalmic ointment Place a 1/2 inch ribbon of ointment into the lower eyelid. (Patient not taking: Reported on 06/14/2023) 3.5 g 0   famotidine (PEPCID) 40 MG tablet Take 1 tablet (40 mg total) by mouth at bedtime. (Patient not taking: Reported on 06/14/2023) 30 tablet 1   olopatadine (PATADAY) 0.1 % ophthalmic solution Place 1 drop into both eyes 2 (two) times daily. (Patient not taking: Reported on 06/14/2023) 5 mL 12   omeprazole (PRILOSEC) 20 MG capsule Take 1 capsule (20 mg total) by mouth daily. 30 capsule 0   promethazine (PHENERGAN) 12.5 MG tablet Take 1-2 tablets (12.5-25 mg total) by mouth every 4 (four) hours as needed  for nausea or vomiting. (Patient not taking: Reported on 06/14/2023) 30 tablet 0   scopolamine (TRANSDERM-SCOP) 1 MG/3DAYS Place 1 patch (1.5 mg total) onto the skin every 3 (three) days. (Patient not taking: Reported on 06/14/2023) 4 patch 0   sucralfate (CARAFATE) 1 GM/10ML suspension Take 10 mLs (1 g total) by mouth 4 (four) times daily -  with meals and at bedtime. (Patient not taking: Reported on 06/14/2023) 420 mL 0   No current facility-administered medications on file prior to visit.     Exam   Vitals:   06/14/23 0910  BP: 110/75  Pulse: 87  Weight: 178 lb 4.8 oz (80.9 kg)   Fetal Heart  Rate (bpm): 139  Uterus:     Pelvic Exam: Perineum: no hemorrhoids, normal perineum   Vulva: normal external genitalia, no lesions   Vagina:  normal mucosa mild amount of white discharge but denies any symptoms   Cervix: no lesions and normal, pap smear done.   System: General: well-developed, well-nourished female in no acute distress   Skin: normal coloration and turgor, no rashes   Neurologic: oriented, normal, negative, normal mood   Extremities: normal strength, tone, and muscle mass, ROM of all joints is normal   HEENT PERRLA, extraocular movement intact and sclera clear, anicteric   Neck supple and no masses   Respiratory:  no respiratory distress      Assessment:   Pregnancy: G2P1001 Patient Active Problem List   Diagnosis Date Noted   Supervision of low-risk pregnancy 06/14/2023   Hyperemesis gravidarum with electrolyte imbalance 05/27/2023   Hypokalemia 05/27/2023   [redacted] weeks gestation of pregnancy 05/27/2023     Plan:  1. Encounter for supervision of low-risk pregnancy in second trimester BP and FHR normal Initial labs drawn. Pap collected today Continue prenatal vitamins. Genetic Screening discussed, NIPS: ordered. Ultrasound discussed; fetal anatomic survey: ordered. Problem list reviewed and updated. The nature of [facility]  with multiple MDs and other Advanced Practice Providers was explained to patient; also emphasized that residents, students are part of our team.  2. Hyperemesis gravidarum with electrolyte imbalance Doing much better, taking zofran and phenergan Having some constipation, miralax sent to pharmacy   Routine obstetric precautions reviewed. Return in 4 weeks (on 07/12/2023) for [facility] patient, ob visit.

## 2023-06-14 NOTE — Patient Instructions (Signed)
Second Trimester of Pregnancy  The second trimester of pregnancy is from week 13 through week 27. This is months 4 through 6 of pregnancy. The second trimester is often a time when you feel your best. Your body has adjusted to being pregnant, and you begin to feel better physically. During the second trimester: Morning sickness has lessened or stopped completely. You may have more energy. You may have an increase in appetite. The second trimester is also a time when the unborn baby (fetus) is growing rapidly. At the end of the sixth month, the fetus may be up to 12 inches long and weigh about 1 pounds. You will likely begin to feel the baby move (quickening) between 16 and 20 weeks of pregnancy. Body changes during your second trimester Your body continues to go through many changes during your second trimester. The changes vary and generally return to normal after the baby is born. Physical changes Your weight will continue to increase. You will notice your lower abdomen bulging out. You may begin to get stretch marks on your hips, abdomen, and breasts. Your breasts will continue to grow and to become tender. Dark spots or blotches (chloasma or mask of pregnancy) may develop on your face. A dark line from your belly button to the pubic area (linea nigra) may appear. You may have changes in your hair. These can include thickening of your hair, rapid growth, and changes in texture. Some people also have hair loss during or after pregnancy, or hair that feels dry or thin. Health changes You may develop headaches. You may have heartburn. You may develop constipation. You may develop hemorrhoids or swollen, bulging veins (varicose veins). Your gums may bleed and may be sensitive to brushing and flossing. You may urinate more often because the fetus is pressing on your bladder. You may have back pain. This is caused by: Weight gain. Pregnancy hormones that are relaxing the joints in your  pelvis. A shift in weight and the muscles that support your balance. Follow these instructions at home: Medicines Follow your health care provider's instructions regarding medicine use. Specific medicines may be either safe or unsafe to take during pregnancy. Do not take any medicines unless approved by your health care provider. Take a prenatal vitamin that contains at least 600 micrograms (mcg) of folic acid. Eating and drinking Eat a healthy diet that includes fresh fruits and vegetables, whole grains, good sources of protein such as meat, eggs, or tofu, and low-fat dairy products. Avoid raw meat and unpasteurized juice, milk, and cheese. These carry germs that can harm you and your baby. You may need to take these actions to prevent or treat constipation: Drink enough fluid to keep your urine pale yellow. Eat foods that are high in fiber, such as beans, whole grains, and fresh fruits and vegetables. Limit foods that are high in fat and processed sugars, such as fried or sweet foods. Activity Exercise only as directed by your health care provider. Most people can continue their usual exercise routine during pregnancy. Try to exercise for 30 minutes at least 5 days a week. Stop exercising if you develop contractions in your uterus. Stop exercising if you develop pain or cramping in the lower abdomen or lower back. Avoid exercising if it is very hot or humid or if you are at a high altitude. Avoid heavy lifting. If you choose to, you may have sex unless your health care provider tells you not to. Relieving pain and discomfort Wear a supportive  bra to prevent discomfort from breast tenderness. Take warm sitz baths to soothe any pain or discomfort caused by hemorrhoids. Use hemorrhoid cream if your health care provider approves. Rest with your legs raised (elevated) if you have leg cramps or low back pain. If you develop varicose veins: Wear support hose as told by your health care  provider. Elevate your feet for 15 minutes, 3-4 times a day. Limit salt in your diet. Safety Wear your seat belt at all times when driving or riding in a car. Talk with your health care provider if someone is verbally or physically abusive to you. Lifestyle Do not use hot tubs, steam rooms, or saunas. Do not douche. Do not use tampons or scented sanitary pads. Avoid cat litter boxes and soil used by cats. These carry germs that can cause birth defects in the baby and possibly loss of the fetus by miscarriage or stillbirth. Do not use herbal remedies, alcohol, illegal drugs, or medicines that are not approved by your health care provider. Chemicals in these products can harm your baby. Do not use any products that contain nicotine or tobacco, such as cigarettes, e-cigarettes, and chewing tobacco. If you need help quitting, ask your health care provider. General instructions During a routine prenatal visit, your health care provider will do a physical exam and other tests. He or she will also discuss your overall health. Keep all follow-up visits. This is important. Ask your health care provider for a referral to a local prenatal education class. Ask for help if you have counseling or nutritional needs during pregnancy. Your health care provider can offer advice or refer you to specialists for help with various needs. Where to find more information American Pregnancy Association: americanpregnancy.org [employer]ologists: https://www.todd-brady.net/ Office on [employer] Health: MightyReward.co.nz Contact a health care provider if you have: A headache that does not go away when you take medicine. Vision changes or you see spots in front of your eyes. Mild pelvic cramps, pelvic pressure, or nagging pain in the abdominal area. Persistent nausea, vomiting, or diarrhea. A bad-smelling vaginal discharge or foul-smelling urine. Pain when you  urinate. Sudden or extreme swelling of your face, hands, ankles, feet, or legs. A fever. Get help right away if you: Have fluid leaking from your vagina. Have spotting or bleeding from your vagina. Have severe abdominal cramping or pain. Have difficulty breathing. Have chest pain. Have fainting spells. Have not felt your baby move for the time period told by your health care provider. Have new or increased pain, swelling, or redness in an arm or leg. Summary The second trimester of pregnancy is from week 13 through week 27 (months 4 through 6). Do not use herbal remedies, alcohol, illegal drugs, or medicines that are not approved by your health care provider. Chemicals in these products can harm your baby. Exercise only as directed by your health care provider. Most people can continue their usual exercise routine during pregnancy. Keep all follow-up visits. This is important. This information is not intended to replace advice given to you by your health care provider. Make sure you discuss any questions you have with your health care provider. Document Revised: 04/20/2020 Document Reviewed: 02/25/2020 Elsevier Patient Education  2024 [employer].  Birth Control Options Birth control is also called contraception. Birth control prevents pregnancy. There are many types of birth control. Work with your health care provider to find the best option for you. Birth control that uses hormones These types of birth  control have hormones in them to prevent pregnancy. Birth control implant This is a small tube that is put into the skin of your arm. The tube can stay in for up to 3 years. Birth control shot These are shots you get every 3 months. Birth control pills This is a pill you take every day. You need to take it at the same time each day. Birth control patch This is a patch that you put on your skin. You change it 1 time each week for 3 weeks. After that, you take the patch off for 1  week. Vaginal ring  This is a soft plastic ring that you put in your vagina. The ring is left in for 3 weeks. Then, you take it out for 1 week. Then, you put a new ring in. Barrier methods  Female condom This is a thin covering that you put on the penis before sex. The condom is thrown away after sex. Female condom This is a soft, loose covering that you put in the vagina before sex. The condom is thrown away after sex. Diaphragm A diaphragm is a soft barrier that is shaped like a bowl. It must be made to fit your body. You put it in the vagina before sex with a chemical that kills sperm called spermicide. A diaphragm should be left in the vagina for 6-8 hours after sex and taken out within 24 hours. You need to replace a diaphragm: Every 1-2 years. After giving birth. After gaining more than 15 lb (6.8 kg). If you have surgery on your pelvis. Cervical cap This is a small, soft cup that fits over the cervix. The cervix is the lowest part of the uterus. It's put in the vagina before sex, along with spermicide. The cap must be made for you. The cap should be left in for 6-8 hours after sex. It is taken out within 48 hours. A cervical cap must be prescribed and fit to your body by a provider. It should be replaced every 2 years. Sponge This is a small sponge that is put into the vagina before sex. It must be left in for at least 6 hours after sex. It must be taken out within 30 hours and thrown away. Spermicides These are chemicals that kill or stop sperm from getting into the uterus. They may be a pill, cream, jelly, or foam that you put into your vagina. They should be used at least 10-15 minutes before sex. Intrauterine device An intrauterine device (IUD) is a device that's put in the uterus by a provider. There are two types: Hormone IUD. This kind can stay in for 3-5 years. Copper IUD. This kind can stay in for 10 years. Permanent birth control Female tubal ligation This is surgery to  block the fallopian tubes. Hysteroscopic sterilization This is a procedure to put an insert into each fallopian tube. This method takes 3 months to work. Other forms of birth control must be used for 3 months. Female sterilization This is a surgery, called a vasectomy, to tie off the tubes that carry sperm in men. This method takes 3 months to work. Other forms of birth control must be used for 3 months. Natural planning methods This means not having sex on the days the female partner could get pregnant. Here are some types of natural planning birth control: Using a calendar: To keep track of the length of each menstrual cycle. To find out what days pregnancy can happen. To plan to  not have sex on days when pregnancy can happen. Watching for signs of ovulation and not having sex during this time. The female partner can check for ovulation by keeping track of their temperature each day. They can also look for changes in the mucus that comes from the cervix. Where to find more information Centers for Disease Control and Prevention: TonerPromos.no This information is not intended to replace advice given to you by your health care provider. Make sure you discuss any questions you have with your health care provider. Document Revised: 01/08/2023 Document Reviewed: 01/08/2023 Elsevier Patient Education  2024 [employer].

## 2023-06-16 LAB — AFP, SERUM, OPEN SPINA BIFIDA
AFP MoM: 0.63
AFP Value: 19.3 ng/mL
Gest. Age on Collection Date: 15.3 weeks
Maternal Age At EDD: 31 yr
OSBR Risk 1 IN: 10000
Test Results:: NEGATIVE
Weight: 174 [lb_av]

## 2023-06-16 LAB — CBC/D/PLT+RPR+RH+ABO+RUBIGG...
Antibody Screen: NEGATIVE
Basophils Absolute: 0 10*3/uL (ref 0.0–0.2)
Basos: 0 %
EOS (ABSOLUTE): 0.2 10*3/uL (ref 0.0–0.4)
Eos: 2 %
HCV Ab: NONREACTIVE
HIV Screen 4th Generation wRfx: NONREACTIVE
Hematocrit: 34 % (ref 34.0–46.6)
Hemoglobin: 11 g/dL — ABNORMAL LOW (ref 11.1–15.9)
Hepatitis B Surface Ag: NEGATIVE
Immature Grans (Abs): 0 10*3/uL (ref 0.0–0.1)
Immature Granulocytes: 0 %
Lymphocytes Absolute: 1.4 10*3/uL (ref 0.7–3.1)
Lymphs: 21 %
MCH: 25.9 pg — ABNORMAL LOW (ref 26.6–33.0)
MCHC: 32.4 g/dL (ref 31.5–35.7)
MCV: 80 fL (ref 79–97)
Monocytes Absolute: 0.4 10*3/uL (ref 0.1–0.9)
Monocytes: 5 %
Neutrophils Absolute: 4.9 10*3/uL (ref 1.4–7.0)
Neutrophils: 72 %
Platelets: 263 10*3/uL (ref 150–450)
RBC: 4.24 x10E6/uL (ref 3.77–5.28)
RDW: 13.7 % (ref 11.7–15.4)
RPR Ser Ql: NONREACTIVE
Rh Factor: POSITIVE
Rubella Antibodies, IGG: 1.12 index (ref >0.99)
WBC: 6.9 10*3/uL (ref 3.4–10.8)

## 2023-06-16 LAB — HCV INTERPRETATION

## 2023-06-16 LAB — HEMOGLOBIN A1C
Est. average glucose Bld gHb Est-mCnc: 105 mg/dL
Hgb A1c MFr Bld: 5.3 % (ref 4.8–5.6)

## 2023-06-19 LAB — CYTOLOGY - PAP
Chlamydia: NEGATIVE
Comment: NEGATIVE
Comment: NEGATIVE
Comment: NEGATIVE
Comment: NORMAL
Diagnosis: NEGATIVE
High risk HPV: NEGATIVE
Neisseria Gonorrhea: NEGATIVE
Trichomonas: NEGATIVE

## 2023-06-26 ENCOUNTER — Other Ambulatory Visit: Payer: Self-pay

## 2023-06-26 ENCOUNTER — Other Ambulatory Visit: Payer: Self-pay | Admitting: Obstetrics and Gynecology

## 2023-06-27 ENCOUNTER — Other Ambulatory Visit (HOSPITAL_BASED_OUTPATIENT_CLINIC_OR_DEPARTMENT_OTHER): Payer: Self-pay

## 2023-06-27 MED ORDER — SCOPOLAMINE 1 MG/3DAYS TD PT72
1.0000 | MEDICATED_PATCH | TRANSDERMAL | 0 refills | Status: DC
  Filled 2023-06-27: qty 4, 12d supply, fill #0

## 2023-06-27 MED ORDER — PROMETHAZINE HCL 12.5 MG PO TABS
12.5000 mg | ORAL_TABLET | ORAL | 0 refills | Status: DC | PRN
  Filled 2023-06-27: qty 30, 3d supply, fill #0

## 2023-06-27 MED ORDER — ONDANSETRON 8 MG PO TBDP
8.0000 mg | ORAL_TABLET | Freq: Three times a day (TID) | ORAL | 0 refills | Status: DC | PRN
  Filled 2023-06-27: qty 30, 10d supply, fill #0

## 2023-07-05 DIAGNOSIS — O99210 Obesity complicating pregnancy, unspecified trimester: Secondary | ICD-10-CM | POA: Insufficient documentation

## 2023-07-05 DIAGNOSIS — D563 Thalassemia minor: Secondary | ICD-10-CM | POA: Insufficient documentation

## 2023-07-08 ENCOUNTER — Other Ambulatory Visit (HOSPITAL_BASED_OUTPATIENT_CLINIC_OR_DEPARTMENT_OTHER): Payer: Self-pay

## 2023-07-10 ENCOUNTER — Encounter: Payer: Self-pay | Admitting: *Deleted

## 2023-07-10 ENCOUNTER — Ambulatory Visit: Payer: Medicaid Other | Admitting: *Deleted

## 2023-07-10 ENCOUNTER — Ambulatory Visit: Payer: Medicaid Other | Attending: Family Medicine

## 2023-07-10 ENCOUNTER — Other Ambulatory Visit: Payer: Self-pay | Admitting: *Deleted

## 2023-07-10 VITALS — BP 113/67 | HR 86

## 2023-07-10 DIAGNOSIS — Z363 Encounter for antenatal screening for malformations: Secondary | ICD-10-CM | POA: Insufficient documentation

## 2023-07-10 DIAGNOSIS — O99210 Obesity complicating pregnancy, unspecified trimester: Secondary | ICD-10-CM | POA: Insufficient documentation

## 2023-07-10 DIAGNOSIS — O99212 Obesity complicating pregnancy, second trimester: Secondary | ICD-10-CM | POA: Insufficient documentation

## 2023-07-10 DIAGNOSIS — E876 Hypokalemia: Secondary | ICD-10-CM | POA: Insufficient documentation

## 2023-07-10 DIAGNOSIS — Z3A19 19 weeks gestation of pregnancy: Secondary | ICD-10-CM | POA: Insufficient documentation

## 2023-07-10 DIAGNOSIS — O211 Hyperemesis gravidarum with metabolic disturbance: Secondary | ICD-10-CM | POA: Insufficient documentation

## 2023-07-10 DIAGNOSIS — Z3492 Encounter for supervision of normal pregnancy, unspecified, second trimester: Secondary | ICD-10-CM | POA: Insufficient documentation

## 2023-07-10 DIAGNOSIS — D563 Thalassemia minor: Secondary | ICD-10-CM

## 2023-07-10 DIAGNOSIS — Z148 Genetic carrier of other disease: Secondary | ICD-10-CM | POA: Insufficient documentation

## 2023-07-10 DIAGNOSIS — Z3A12 12 weeks gestation of pregnancy: Secondary | ICD-10-CM

## 2023-07-15 ENCOUNTER — Inpatient Hospital Stay (HOSPITAL_COMMUNITY)
Admission: AD | Admit: 2023-07-15 | Discharge: 2023-07-15 | Disposition: A | Discharge status: Home / Self Care | Payer: Medicaid Other | Source: Home / Self Care | Attending: Obstetrics and Gynecology | Admitting: Obstetrics and Gynecology

## 2023-07-15 ENCOUNTER — Encounter (HOSPITAL_COMMUNITY): Payer: Self-pay | Admitting: Obstetrics and Gynecology

## 2023-07-15 DIAGNOSIS — O211 Hyperemesis gravidarum with metabolic disturbance: Secondary | ICD-10-CM

## 2023-07-15 DIAGNOSIS — O26892 Other specified pregnancy related conditions, second trimester: Secondary | ICD-10-CM | POA: Diagnosis present

## 2023-07-15 DIAGNOSIS — Z3A19 19 weeks gestation of pregnancy: Secondary | ICD-10-CM | POA: Insufficient documentation

## 2023-07-15 DIAGNOSIS — R1110 Vomiting, unspecified: Secondary | ICD-10-CM | POA: Diagnosis not present

## 2023-07-15 DIAGNOSIS — Z3A12 12 weeks gestation of pregnancy: Secondary | ICD-10-CM

## 2023-07-15 DIAGNOSIS — O210 Mild hyperemesis gravidarum: Secondary | ICD-10-CM | POA: Diagnosis not present

## 2023-07-15 DIAGNOSIS — R079 Chest pain, unspecified: Secondary | ICD-10-CM | POA: Diagnosis not present

## 2023-07-15 LAB — URINALYSIS, ROUTINE W REFLEX MICROSCOPIC
Bilirubin Urine: NEGATIVE
Glucose, UA: NEGATIVE mg/dL
Hgb urine dipstick: NEGATIVE
Ketones, ur: 80 mg/dL — AB
Nitrite: NEGATIVE
Protein, ur: 100 mg/dL — AB
Specific Gravity, Urine: 1.02 (ref 1.005–1.030)
pH: 6 (ref 5.0–8.0)

## 2023-07-15 MED ORDER — SODIUM CHLORIDE 0.9 % IV SOLN
25.0000 mg | Freq: Once | INTRAVENOUS | Status: AC
  Administered 2023-07-15: 25 mg via INTRAVENOUS
  Filled 2023-07-15: qty 1

## 2023-07-15 MED ORDER — ALBUTEROL SULFATE HFA 108 (90 BASE) MCG/ACT IN AERS: 2.0000 | INHALATION_SPRAY | Freq: Four times a day (QID) | RESPIRATORY_TRACT | 6 refills | Status: DC | PRN

## 2023-07-15 MED ORDER — FAMOTIDINE 40 MG PO TABS: 40.0000 mg | ORAL_TABLET | Freq: Every day | ORAL | 1 refills | Status: DC

## 2023-07-15 MED ORDER — SCOPOLAMINE 1 MG/3DAYS TD PT72
1.0000 | MEDICATED_PATCH | Freq: Once | TRANSDERMAL | Status: DC
  Administered 2023-07-15: 1.5 mg via TRANSDERMAL
  Filled 2023-07-15: qty 1

## 2023-07-15 MED ORDER — PROMETHAZINE HCL 25 MG PO TABS: 25.0000 mg | ORAL_TABLET | Freq: Four times a day (QID) | ORAL | 3 refills | Status: DC | PRN

## 2023-07-15 MED ORDER — EPINEPHRINE 0.3 MG/0.3ML IJ SOAJ: 0.3000 mg | INTRAMUSCULAR | 3 refills | Status: DC | PRN

## 2023-07-15 MED ORDER — FAMOTIDINE IN NACL 20-0.9 MG/50ML-% IV SOLN
20.0000 mg | Freq: Once | INTRAVENOUS | Status: AC
  Administered 2023-07-15: 20 mg via INTRAVENOUS
  Filled 2023-07-15: qty 50

## 2023-07-15 MED ORDER — LACTATED RINGERS IV SOLN
Freq: Once | INTRAVENOUS | Status: AC
  Filled 2023-07-15: qty 1000

## 2023-07-15 MED ORDER — CYCLOBENZAPRINE HCL 10 MG PO TABS: 10.0000 mg | ORAL_TABLET | Freq: Two times a day (BID) | ORAL | 0 refills | Status: DC | PRN

## 2023-07-15 MED ORDER — OMEPRAZOLE 20 MG PO CPDR: 20.0000 mg | DELAYED_RELEASE_CAPSULE | Freq: Every day | ORAL | 0 refills | Status: DC

## 2023-07-15 MED ORDER — SCOPOLAMINE 1 MG/3DAYS TD PT72: 1.0000 | MEDICATED_PATCH | TRANSDERMAL | 3 refills | Status: DC

## 2023-07-15 NOTE — MAU Provider Note (Signed)
History     CSN: [id number]  Arrival date and time: 07/15/23 0347   Event Date/Time   First Provider Initiated Contact with Patient 07/15/23 [phone]      Chief Complaint  Patient presents with   Chest Pain   HPI Ms. Zanyia Hull is a 32 y.o. year old G27P1001 female at [redacted]w[redacted]d weeks gestation who presents to MAU reporting on-going N/V throughout pregnancy that worsened Thursday when she started running out of her antiemetics. She requested to have them refilled, "but they haven't sent them in yet and the the last time they sent them to the wrong pharmacy." She started having a intermittent, sharp, stabbing pain in the LT side of her rib area. She reports the pain is now constant after she was "elbowed in the LT rib Saturday." That made the pain worse; rated 9/10. She reports she took Zofran and Tylenol 500 mg this morning at about 0630, but vomited that up. She was at work this morning and was made to come here by EMS by her boss. She states, I really just want to get some sleep. 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   Sprain of MCL (medial collateral ligament) of knee 10/12/2021    Past Surgical History:  Procedure Laterality Date   BIOPSY  05/27/2018   Procedure: BIOPSY;  Surgeon: Graylin Shiver, MD;  Location: [facility] ENDOSCOPY;  Service: Endoscopy;;   ESOPHAGOGASTRODUODENOSCOPY (EGD) WITH PROPOFOL N/A 09/11/2017   Procedure: ESOPHAGOGASTRODUODENOSCOPY (EGD) WITH PROPOFOL;  Surgeon:  Charlott Rakes, MD;  Location: [facility] ENDOSCOPY;  Service: Endoscopy;  Laterality: N/A;   ESOPHAGOGASTRODUODENOSCOPY (EGD) WITH PROPOFOL N/A 02/20/2018   Procedure: ESOPHAGOGASTRODUODENOSCOPY (EGD) WITH PROPOFOL;  Surgeon: Rachael Fee, MD;  Location: [facility] ENDOSCOPY;  Service: Endoscopy;  Laterality: N/A;   ESOPHAGOGASTRODUODENOSCOPY (EGD) WITH PROPOFOL N/A 05/27/2018   Procedure: ESOPHAGOGASTRODUODENOSCOPY (EGD) WITH PROPOFOL WITH CLIP PLACEMENT;  Surgeon: Graylin Shiver, MD;  Location: [facility] ENDOSCOPY;  Service: Endoscopy;  Laterality: N/A;   INSERTION OF MESH N/A 12/26/2016   Procedure: INSERTION OF MESH;  Surgeon: Abigail Miyamoto, MD;  Location: [facility] ORS;  Service: General;  Laterality: N/A;   KNEE ARTHROSCOPY WITH MEDIAL PATELLAR FEMORAL LIGAMENT RECONSTRUCTION Left 03/09/2022   Procedure: LEFT KNEE ARTHROSCOPY WITH MEDIAL PATELLAR FEMORAL LIGAMENT RECONSTRUCTION WILL ALLOGRAFT;  Surgeon: Yolonda Kida, MD;  Location: [facility] ORS;  Service: Orthopedics;  Laterality: Left;   LAPAROSCOPY N/A 12/26/2016   Procedure: LAPAROSCOPY DIAGNOSTIC;  Surgeon: Abigail Miyamoto, MD;  Location: [facility] ORS;  Service: General;  Laterality: N/A;   UMBILICAL HERNIA REPAIR N/A 12/26/2016   Procedure: HERNIA REPAIR UMBILICAL ADULT;  Surgeon: Abigail Miyamoto, MD;  Location: [facility] ORS;  Service: General;  Laterality: N/A;  Family History  Problem Relation Age of Onset   Hypertension Mother    Stroke Mother    CAD Mother 62       MI on 10/18   Colon cancer Neg Hx    Stomach cancer Neg Hx    Rectal cancer Neg Hx     Social History   Tobacco Use   Smoking status: Former    Current packs/day: 0.00    Average packs/day: 0.3 packs/day for 4.0 years (1.0 ttl pk-yrs)    Types: Cigarettes    Start date: 2015    Quit date: 2019    Years since quitting: 5.6   Smokeless tobacco: Never  Vaping Use   Vaping status: Never Used  Substance Use Topics   Alcohol use: Not Currently    Comment: rarely   Drug use: No     Allergies:  Allergies  Allergen Reactions   Iohexol Hives, Shortness Of Breath, Itching, Nausea And Vomiting, Swelling and Other (See Comments)    Also caused chest pain PER DR BARRY, PRE MED PROTOCOL SHOULD BE ADMINISTERED PRIOR TO SCANS IN THE FUTURE.  PT HAD A SIGNIFICANT REACTION.  [facility]  08/18/17 Patient had a reaction of SOB, swelling even though premedicated. Physician states no further contrast imaging in the future.   Peanut-Containing Drug Products Anaphylaxis and Swelling    Facial swelling   Other Swelling    Tree nuts cause facial swelling   Sulfa Antibiotics Swelling    Facial swelling (reported by [facility] 2013)   Iodinated Contrast Media Rash    Medications Prior to Admission  Medication Sig Dispense Refill Last Dose   acetaminophen (TYLENOL) 500 MG tablet Take 500 mg by mouth every 6 (six) hours as needed.   07/15/2023 at 0630   ondansetron (ZOFRAN-ODT) 8 MG disintegrating tablet Take 1 tablet (8 mg total) by mouth every 8 (eight) hours as needed for nausea 30 tablet 0 07/15/2023   acetaminophen (TYLENOL) 650 MG CR tablet Take 1,300 mg by mouth every 8 (eight) hours as needed for pain. (Patient not taking: Reported on 06/14/2023)      albuterol (VENTOLIN HFA) 108 (90 Base) MCG/ACT inhaler Inhale 2 puffs into the lungs every 6 (six) hours as needed for wheezing or shortness of breath.      cetirizine (ZYRTEC ALLERGY) 10 MG tablet Take 1 tablet (10 mg total) by mouth daily. (Patient not taking: Reported on 06/14/2023) 30 tablet 0    EPINEPHrine 0.3 mg/0.3 mL IJ SOAJ injection Inject 0.3 mLs (0.3 mg total) into the muscle as needed for anaphylaxis. 2 each 3    erythromycin ophthalmic ointment Place a 1/2 inch ribbon of ointment into the lower eyelid. (Patient not taking: Reported on 06/14/2023) 3.5 g 0    famotidine (PEPCID) 40 MG tablet Take 1 tablet (40 mg total) by mouth at bedtime. (Patient not taking: Reported on 06/14/2023) 30 tablet 1    metoCLOPramide (REGLAN) 10 MG  tablet Take 1 tablet (10 mg total) by mouth every 6 (six) hours. 30 tablet 1    olopatadine (PATADAY) 0.1 % ophthalmic solution Place 1 drop into both eyes 2 (two) times daily. (Patient not taking: Reported on 06/14/2023) 5 mL 12    omeprazole (PRILOSEC) 20 MG capsule Take 1 capsule (20 mg total) by mouth daily. 30 capsule 0    polyethylene glycol powder (GLYCOLAX/MIRALAX) 17 GM/SCOOP powder Take 17 g by mouth daily as needed. 510 g 1    potassium chloride SA (KLOR-CON M) 20  MEQ tablet One po bid x 3 days, then one po once a day 20 tablet 0    Prenatal Vit-Fe Phos-FA-Omega (VITAFOL GUMMIES) 3.33-0.333-34.8 MG CHEW Chew 1 tablet by mouth daily. 90 tablet 5    promethazine (PHENERGAN) 12.5 MG tablet Take 1-2 tablets (12.5-25 mg total) by mouth every 4 (four) hours as needed for nausea or vomiting. 30 tablet 0    scopolamine (TRANSDERM-SCOP) 1 MG/3DAYS Place 1 patch (1.5 mg total) onto the skin every 3 (three) days. 4 patch 0    sucralfate (CARAFATE) 1 GM/10ML suspension Take 10 mLs (1 g total) by mouth 4 (four) times daily -  with meals and at bedtime. (Patient not taking: Reported on 06/14/2023) 420 mL 0     Review of Systems Physical Exam   Blood pressure 113/66, pulse 84, temperature 97.9 F (36.6 C), temperature source Oral, resp. rate 20, last menstrual period 12/07/2022, SpO2 99%.  Physical Exam  MAU Course  Procedures  MDM CCUA IVFs: Phenergan 25 mg in LR 1000 ml @ 999 ml/hr  -- no nausea/vomiting MVI 1 ampule in LR 1000 ml @ 999 ml/hr Scopolamine Patch x 1 Pepcid 20 mg IVPB PO Challenge -- patient tolerated well  Results for orders placed or performed during the hospital encounter of 07/15/23 (from the past 24 hour(s))  Urinalysis, Routine w reflex microscopic -Urine, Clean Catch     Status: Abnormal   Collection Time: 07/15/23  9:25 AM  Result Value Ref Range   Color, Urine Celestia (A) YELLOW   APPearance CLOUDY (A) CLEAR   Specific Gravity, Urine 1.020 1.005 - 1.030   pH 6.0  5.0 - 8.0   Glucose, UA NEGATIVE NEGATIVE mg/dL   Hgb urine dipstick NEGATIVE NEGATIVE   Bilirubin Urine NEGATIVE NEGATIVE   Ketones, ur 80 (A) NEGATIVE mg/dL   Protein, ur 564 (A) NEGATIVE mg/dL   Nitrite NEGATIVE NEGATIVE   Leukocytes,Ua SMALL (A) NEGATIVE   RBC / HPF 0-5 0 - 5 RBC/hpf   WBC, UA 11-20 0 - 5 WBC/hpf   Bacteria, UA MANY (A) NONE SEEN   Squamous Epithelial / HPF 11-20 0 - 5 /HPF   Mucus PRESENT     Assessment and Plan  1. Hyperemesis gravidarum  - Rx: Phenergan 25 mg po every 6 hrs prn N/V - Rx: Scopolamine patch 1 mg applied every 3 days per pt request  2. [redacted] weeks gestation of pregnancy - Rx: Flexeril 10 mg po BID prn muscle spasm - Rx: Epinephrine 0.3 mg/0.3 mL refill per pt request - Rx: Albuterol 2 puffs every 6 hrs prn per pt request    - Discharge patient    Raelyn Mora, CNM 07/15/2023, 9:58 AM

## 2023-07-15 NOTE — Discharge Instructions (Signed)
Your pr

## 2023-07-15 NOTE — MAU Note (Signed)
.  [name] [name] is a 32 y.o. at [redacted]w[redacted]d here in MAU reporting: ongoing nausea and vomiting throughout her pregnancy, on Thursday she started to have intermittent stabbing pain in the middle of her chest and left rib area. Now the pain is constant. She states on Saturday she was elbowed in the left rib and it has only made the pain worse. Denies VB or LOF. Took 8mg  of zofran  and 500mg  of tylenol at 0630 this morning, but threw it up.   Pain score: 9 Vitals:   07/15/23 0912  BP: 113/66  Pulse: 84  Resp: 20  Temp: 97.9 F (36.6 C)  SpO2: 99%     FHT:136 Lab orders placed from triage:  UA

## 2023-07-18 ENCOUNTER — Encounter: Payer: Medicaid Other | Admitting: Family Medicine

## 2023-07-26 ENCOUNTER — Encounter: Payer: Medicaid Other | Admitting: Obstetrics and Gynecology

## 2023-07-30 ENCOUNTER — Encounter: Payer: Self-pay | Admitting: Obstetrics and Gynecology

## 2023-07-30 ENCOUNTER — Ambulatory Visit (INDEPENDENT_AMBULATORY_CARE_PROVIDER_SITE_OTHER): Payer: Medicaid Other | Admitting: Obstetrics and Gynecology

## 2023-07-30 ENCOUNTER — Other Ambulatory Visit: Payer: Self-pay

## 2023-07-30 VITALS — BP 118/74 | HR 96 | Wt 185.0 lb

## 2023-07-30 DIAGNOSIS — O99210 Obesity complicating pregnancy, unspecified trimester: Secondary | ICD-10-CM

## 2023-07-30 DIAGNOSIS — D563 Thalassemia minor: Secondary | ICD-10-CM

## 2023-07-30 DIAGNOSIS — Z3492 Encounter for supervision of normal pregnancy, unspecified, second trimester: Secondary | ICD-10-CM

## 2023-07-30 DIAGNOSIS — O99212 Obesity complicating pregnancy, second trimester: Secondary | ICD-10-CM

## 2023-07-30 DIAGNOSIS — O219 Vomiting of pregnancy, unspecified: Secondary | ICD-10-CM | POA: Insufficient documentation

## 2023-07-30 DIAGNOSIS — Z3A21 21 weeks gestation of pregnancy: Secondary | ICD-10-CM

## 2023-07-30 MED ORDER — SCOPOLAMINE 1 MG/3DAYS TD PT72: 1.0000 | MEDICATED_PATCH | TRANSDERMAL | 1 refills | Status: DC

## 2023-07-30 MED ORDER — PROMETHAZINE HCL 25 MG PO TABS
25.0000 mg | ORAL_TABLET | Freq: Four times a day (QID) | ORAL | 3 refills | Status: DC | PRN
Start: 2023-07-30 — End: 2023-10-11

## 2023-07-30 MED ORDER — CYCLOBENZAPRINE HCL 10 MG PO TABS
10.0000 mg | ORAL_TABLET | Freq: Two times a day (BID) | ORAL | 1 refills | Status: DC | PRN
Start: 2023-07-30 — End: 2023-09-27

## 2023-07-30 NOTE — Progress Notes (Signed)
Subjective:  [name] [name] is a 32 y.o. G2P1001 at [redacted]w[redacted]d being seen today for ongoing prenatal care.  She is currently monitored for the following issues for this high-risk pregnancy and has Supervision of low-risk pregnancy; Obesity affecting pregnancy, antepartum; Alpha thalassemia silent carrier; and Nausea and vomiting during pregnancy on their problem list.  Patient reports  occ episodes of N/V, medication helps when she takes .  Contractions: Not present. Vag. Bleeding: None.  Movement: Present. Denies leaking of fluid.   The following portions of the patient's history were reviewed and updated as appropriate: allergies, current medications, past family history, past medical history, past social history, past surgical history and problem list. Problem list updated.  Objective:   Vitals:   07/30/23 1633  BP: 118/74  Pulse: 96  Weight: 185 lb (83.9 kg)    Fetal Status: Fetal Heart Rate (bpm): 156   Movement: Present     General:  Alert, oriented and cooperative. Patient is in no acute distress.  Skin: Skin is warm and dry. No rash noted.   Cardiovascular: Normal heart rate noted  Respiratory: Normal respiratory effort, no problems with respiration noted  Abdomen: Soft, gravid, appropriate for gestational age. Pain/Pressure: Present     Pelvic:  Cervical exam deferred        Extremities: Normal range of motion.  Edema: Trace  Mental Status: Normal mood and affect. Normal behavior. Normal judgment and thought content.   Urinalysis:      Assessment and Plan:  Pregnancy: G2P1001 at [redacted]w[redacted]d  1. Encounter for supervision of low-risk pregnancy in second trimester Stable - cyclobenzaprine (FLEXERIL) 10 MG tablet; Take 1 tablet (10 mg total) by mouth 2 (two) times daily as needed.  Dispense: 20 tablet; Refill: 1  2. Obesity affecting pregnancy, antepartum, unspecified obesity type Stable Serial growth scans as per MFM  3. Alpha thalassemia silent carrier Partner testing kit  provided today  4. Nausea and vomiting during pregnancy Stable - scopolamine (TRANSDERM-SCOP) 1 MG/3DAYS; Place 1 patch (1.5 mg total) onto the skin every 3 (three) days.  Dispense: 4 patch; Refill: 1 - promethazine (PHENERGAN) 25 MG tablet; Take 1 tablet (25 mg total) by mouth every 6 (six) hours as needed for nausea or vomiting.  Dispense: 30 tablet; Refill: 3  Preterm labor symptoms and general obstetric precautions including but not limited to vaginal bleeding, contractions, leaking of fluid and fetal movement were reviewed in detail with the patient. Please refer to After Visit Summary for other counseling recommendations.  Return for OB visit, face to face, any provider.   Hermina Staggers, MD

## 2023-08-02 ENCOUNTER — Encounter: Payer: Self-pay | Admitting: Obstetrics and Gynecology

## 2023-08-27 ENCOUNTER — Other Ambulatory Visit: Payer: Self-pay

## 2023-08-27 ENCOUNTER — Ambulatory Visit (INDEPENDENT_AMBULATORY_CARE_PROVIDER_SITE_OTHER): Payer: Medicaid Other | Admitting: Family Medicine

## 2023-08-27 VITALS — BP 110/61 | HR 84 | Wt 187.2 lb

## 2023-08-27 DIAGNOSIS — O99210 Obesity complicating pregnancy, unspecified trimester: Secondary | ICD-10-CM

## 2023-08-27 DIAGNOSIS — D563 Thalassemia minor: Secondary | ICD-10-CM

## 2023-08-27 DIAGNOSIS — Z23 Encounter for immunization: Secondary | ICD-10-CM

## 2023-08-27 DIAGNOSIS — Z3492 Encounter for supervision of normal pregnancy, unspecified, second trimester: Secondary | ICD-10-CM

## 2023-08-27 DIAGNOSIS — K439 Ventral hernia without obstruction or gangrene: Secondary | ICD-10-CM

## 2023-08-27 DIAGNOSIS — Z3A25 25 weeks gestation of pregnancy: Secondary | ICD-10-CM

## 2023-08-27 DIAGNOSIS — O219 Vomiting of pregnancy, unspecified: Secondary | ICD-10-CM

## 2023-08-27 DIAGNOSIS — O99212 Obesity complicating pregnancy, second trimester: Secondary | ICD-10-CM

## 2023-08-27 NOTE — Progress Notes (Addendum)
   PRENATAL VISIT NOTE  Subjective:  [name] [name] is a 32 y.o. G2P1001 at [redacted]w[redacted]d being seen today for ongoing prenatal care.  She is currently monitored for the following issues for this high-risk pregnancy and has Supervision of low-risk pregnancy; Obesity affecting pregnancy, antepartum; Alpha thalassemia silent carrier; and Nausea and vomiting during pregnancy on their problem list.  Patient reports  anterior abdominal pain, chronic left knee pain .  Contractions: Irritability. Vag. Bleeding: None.  Movement: Present. Denies leaking of fluid.   The following portions of the patient's history were reviewed and updated as appropriate: allergies, current medications, past family history, past medical history, past social history, past surgical history and problem list.   Objective:   Vitals:   08/27/23 1048  BP: 110/61  Pulse: 84  Weight: 187 lb 3.2 oz (84.9 kg)    Fetal Status: Fetal Heart Rate (bpm): 140   Movement: Present     General:  Alert, oriented and cooperative. Patient is in no acute distress.  Skin: Skin is warm and dry. No rash noted.   Cardiovascular: Normal heart rate noted  Respiratory: Normal respiratory effort, no problems with respiration noted  Abdomen: Soft, gravid, appropriate for gestational age.  Pain/Pressure: Present     Pelvic: Cervical exam deferred        Extremities: Normal range of motion.     Mental Status: Normal mood and affect. Normal behavior. Normal judgment and thought content.   Assessment and Plan:  Pregnancy: G2P1001 at [redacted]w[redacted]d 1. Encounter for supervision of low-risk pregnancy in second trimester Continue routine prenatal care Tdap given today  2. [redacted] weeks gestation of pregnancy Scheduled for GTT at next visit  3. Obesity affecting pregnancy, antepartum, unspecified obesity type  4. Alpha thalassemia silent carrier Patient previously given partner testing  5. Nausea and vomiting during pregnancy Stable at this time Currently on  scopolamine as well as Phenergan.  Gaining weight appropriately.  6.Ventral hernia without obstruction on gangrene  Patient complaining of chronic pain on left anterior abdominal wall.  Physical exam showing soft spot consistent with ventral abdominal hernia likely related to port placement from previous hernia repair.  No bowel protruding.  Patient reports it does not get hard but it is chronically tender.  Discussed emergency room precautions and consider evaluation postpartum.  Preterm labor symptoms and general obstetric precautions including but not limited to vaginal bleeding, contractions, leaking of fluid and fetal movement were reviewed in detail with the patient. Please refer to After Visit Summary for other counseling recommendations.   No follow-ups on file.  Future Appointments  Date Time Provider Department Center  10/02/2023 10:30 AM [facility] US1 [id number]    Celedonio Savage, MD

## 2023-08-27 NOTE — Patient Instructions (Signed)

## 2023-08-31 IMAGING — CR DG KNEE 1-2V*L*
3 series · 3 of 3 positions shown · non-contrast
Comparison: None.

CLINICAL DATA: Knee injury

EXAM:
LEFT KNEE - 1-2 VIEW

[t knee lat left (1 of 2)]
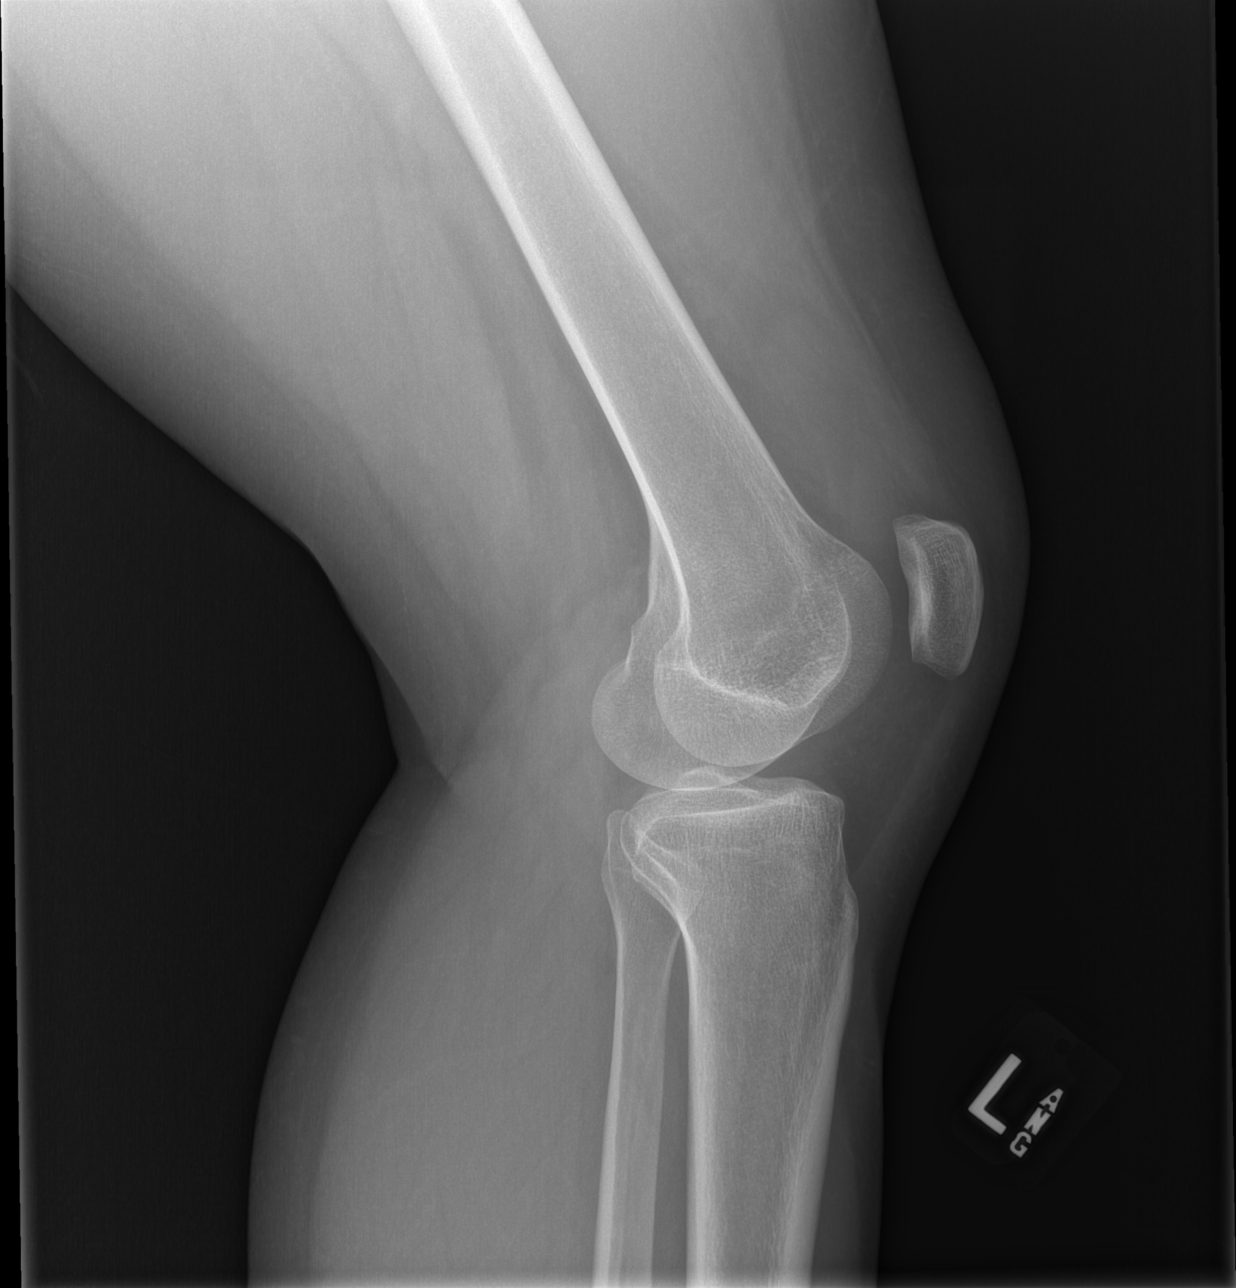

[t knee lat left (2 of 2)]
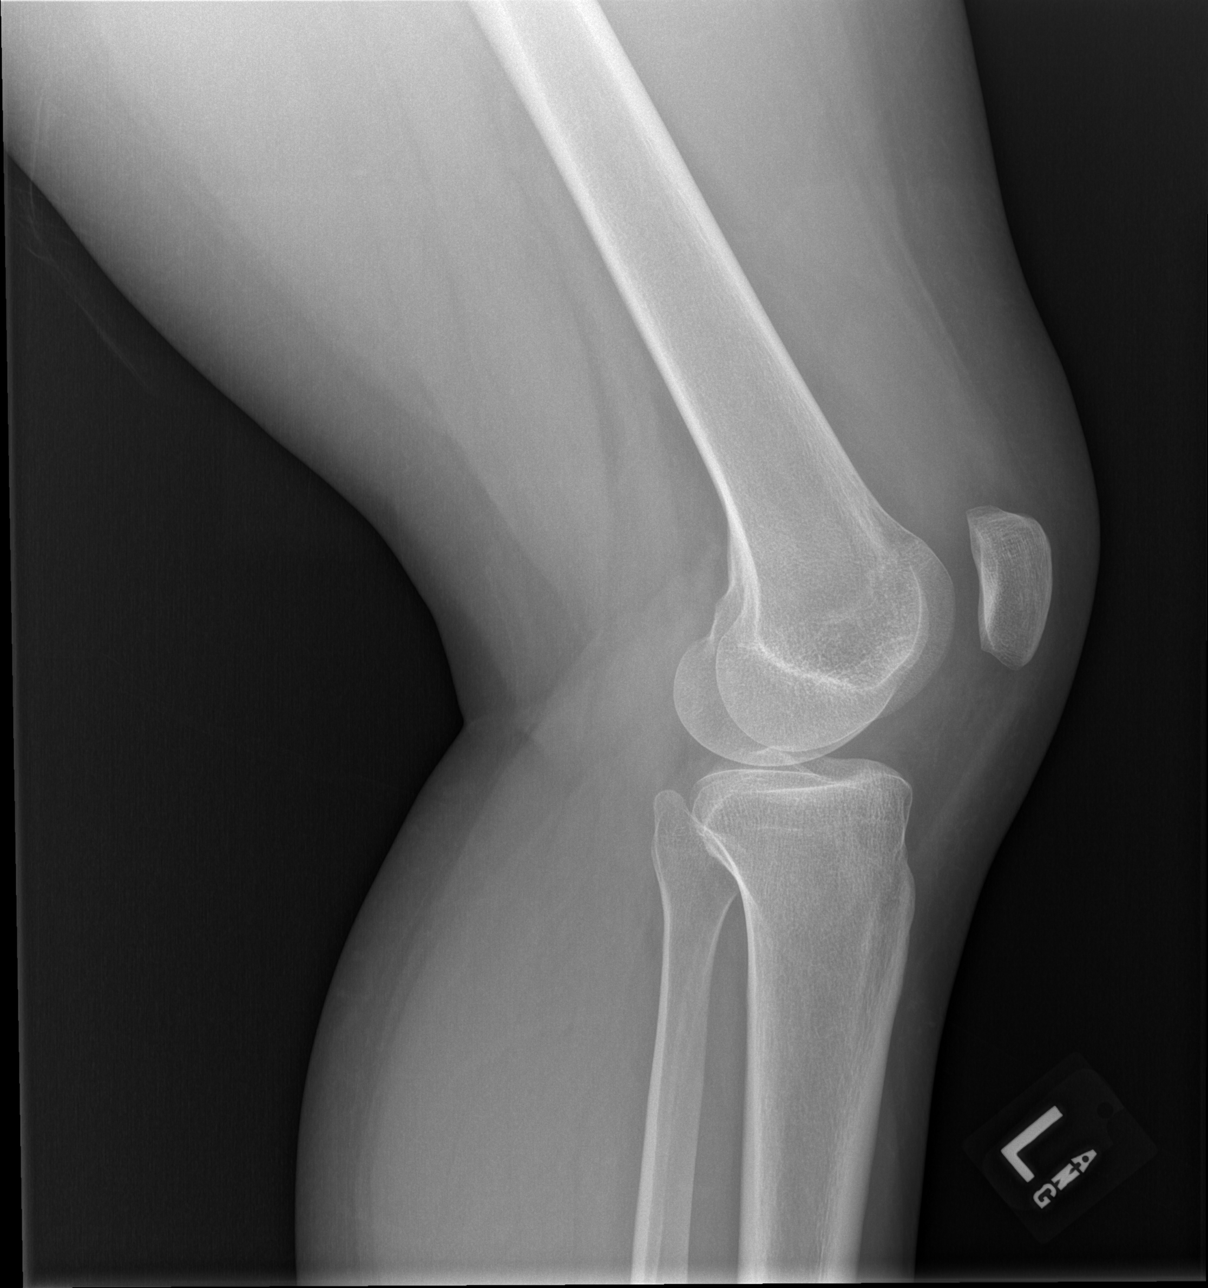

[t knee ap left]
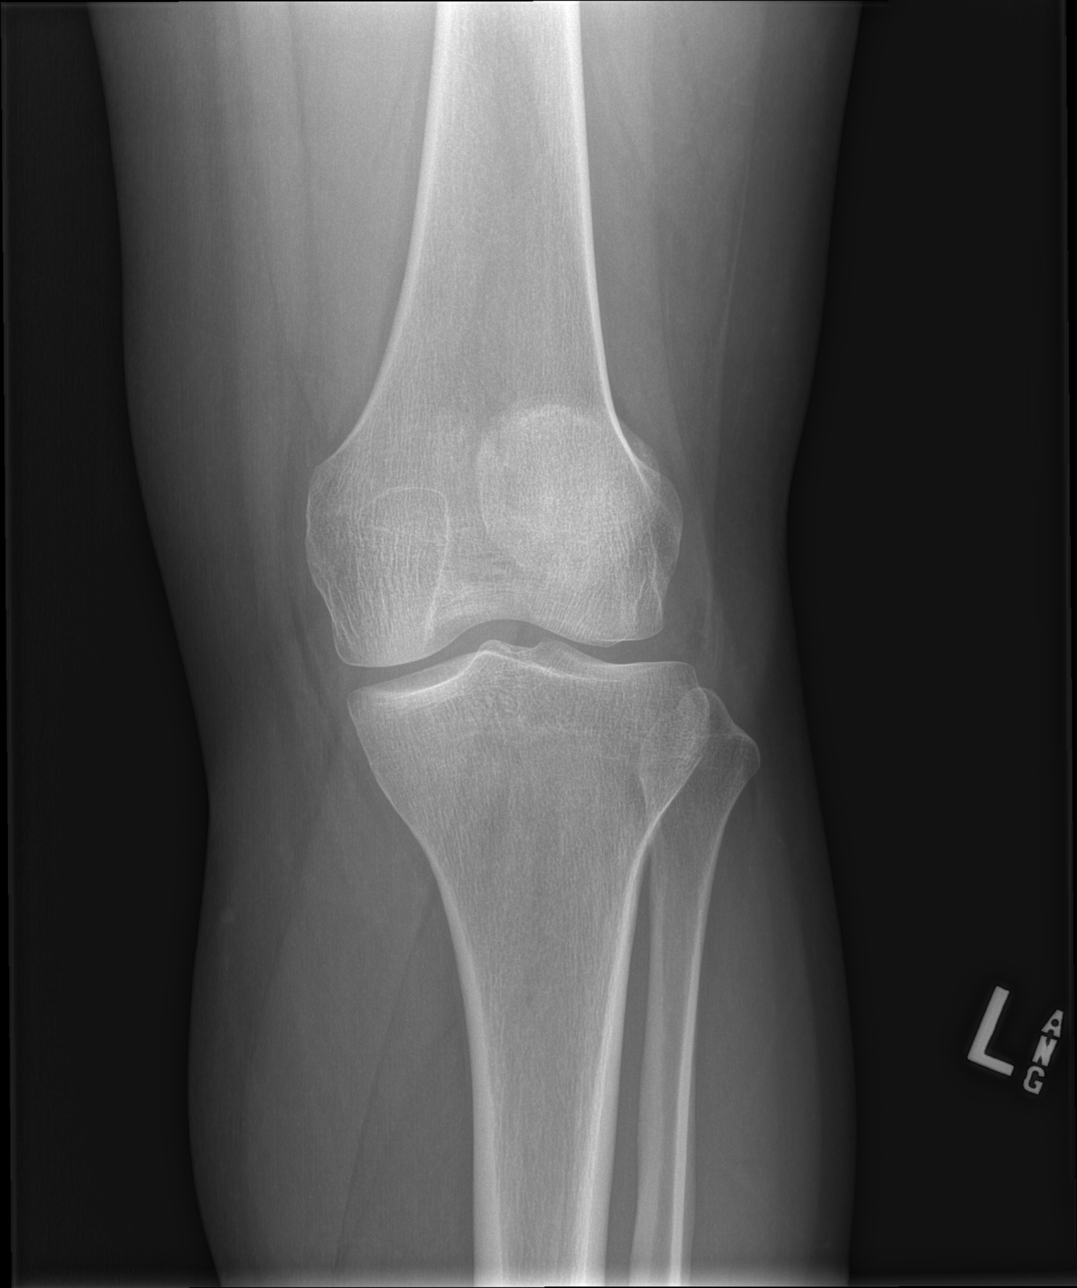

[3 of 3 positions shown; findings below may reference images not displayed]

FINDINGS: No acute fracture or dislocation. No aggressive osseous lesion.
Normal alignment. Large joint effusion.

Soft tissue are unremarkable. No radiopaque foreign body or soft
tissue emphysema.
IMPRESSION: No acute osseous injury of the left knee.

## 2023-09-12 ENCOUNTER — Other Ambulatory Visit: Payer: Self-pay

## 2023-09-12 DIAGNOSIS — Z3492 Encounter for supervision of normal pregnancy, unspecified, second trimester: Secondary | ICD-10-CM

## 2023-09-13 ENCOUNTER — Ambulatory Visit (INDEPENDENT_AMBULATORY_CARE_PROVIDER_SITE_OTHER): Payer: Medicaid Other | Admitting: Family Medicine

## 2023-09-13 ENCOUNTER — Other Ambulatory Visit: Payer: Medicaid Other

## 2023-09-13 ENCOUNTER — Encounter: Payer: Self-pay | Admitting: Family Medicine

## 2023-09-13 ENCOUNTER — Other Ambulatory Visit: Payer: Self-pay

## 2023-09-13 VITALS — BP 107/72 | HR 91 | Wt 190.6 lb

## 2023-09-13 DIAGNOSIS — O99213 Obesity complicating pregnancy, third trimester: Secondary | ICD-10-CM

## 2023-09-13 DIAGNOSIS — Z3009 Encounter for other general counseling and advice on contraception: Secondary | ICD-10-CM | POA: Insufficient documentation

## 2023-09-13 DIAGNOSIS — Z3492 Encounter for supervision of normal pregnancy, unspecified, second trimester: Secondary | ICD-10-CM

## 2023-09-13 DIAGNOSIS — O99210 Obesity complicating pregnancy, unspecified trimester: Secondary | ICD-10-CM

## 2023-09-13 DIAGNOSIS — Z3A28 28 weeks gestation of pregnancy: Secondary | ICD-10-CM

## 2023-09-13 NOTE — Patient Instructions (Signed)

## 2023-09-13 NOTE — Progress Notes (Signed)
   Subjective:  [name] [name] is a 32 y.o. G2P1001 at [redacted]w[redacted]d being seen today for ongoing prenatal care.  She is currently monitored for the following issues for this low-risk pregnancy and has Supervision of low-risk pregnancy; Obesity affecting pregnancy, antepartum; Alpha thalassemia silent carrier; and Nausea and vomiting during pregnancy on their problem list.  Patient reports no complaints.  Contractions: Irritability. Vag. Bleeding: None.  Movement: Present. Denies leaking of fluid.   The following portions of the patient's history were reviewed and updated as appropriate: allergies, current medications, past family history, past medical history, past social history, past surgical history and problem list. Problem list updated.  Objective:   Vitals:   09/13/23 1009  BP: 107/72  Pulse: 91  Weight: 190 lb 9.6 oz (86.5 kg)    Fetal Status: Fetal Heart Rate (bpm): 145   Movement: Present     General:  Alert, oriented and cooperative. Patient is in no acute distress.  Skin: Skin is warm and dry. No rash noted.   Cardiovascular: Normal heart rate noted  Respiratory: Normal respiratory effort, no problems with respiration noted  Abdomen: Soft, gravid, appropriate for gestational age. Pain/Pressure: Present     Pelvic: Vag. Bleeding: None     Cervical exam deferred        Extremities: Normal range of motion.     Mental Status: Normal mood and affect. Normal behavior. Normal judgment and thought content.   Urinalysis:      Assessment and Plan:  Pregnancy: G2P1001 at [redacted]w[redacted]d  1. Encounter for supervision of low-risk pregnancy in second trimester BP and FHR normal Had tdap last visit 28 wk labs today  2. Obesity affecting pregnancy, antepartum, unspecified obesity type Has follow up US with MFM already scheduled  3. Unwanted fertility BTL consent signed today after counseling on risks/benefits/permanence   Preterm labor symptoms and general obstetric precautions including but  not limited to vaginal bleeding, contractions, leaking of fluid and fetal movement were reviewed in detail with the patient. Please refer to After Visit Summary for other counseling recommendations.  Return in 2 weeks (on 09/27/2023) for [facility] patient, ob visit.   Venora Maples, MD

## 2023-09-14 LAB — GLUCOSE TOLERANCE, 2 HOURS W/ 1HR
Glucose, 1 hour: 131 mg/dL (ref 70–179)
Glucose, 2 hour: 113 mg/dL (ref 70–152)
Glucose, Fasting: 79 mg/dL (ref 70–91)

## 2023-09-14 LAB — CBC
Hematocrit: 31 % — ABNORMAL LOW (ref 34.0–46.6)
Hemoglobin: 9.8 g/dL — ABNORMAL LOW (ref 11.1–15.9)
MCH: 26.2 pg — ABNORMAL LOW (ref 26.6–33.0)
MCHC: 31.6 g/dL (ref 31.5–35.7)
MCV: 83 fL (ref 79–97)
Platelets: 276 10*3/uL (ref 150–450)
RBC: 3.74 x10E6/uL — ABNORMAL LOW (ref 3.77–5.28)
RDW: 13.3 % (ref 11.7–15.4)
WBC: 10.3 10*3/uL (ref 3.4–10.8)

## 2023-09-14 LAB — HIV ANTIBODY (ROUTINE TESTING W REFLEX): HIV Screen 4th Generation wRfx: NONREACTIVE

## 2023-09-14 LAB — RPR: RPR Ser Ql: NONREACTIVE

## 2023-09-18 ENCOUNTER — Encounter: Payer: Self-pay | Admitting: Family Medicine

## 2023-09-18 ENCOUNTER — Other Ambulatory Visit: Payer: Self-pay | Admitting: Family Medicine

## 2023-09-18 DIAGNOSIS — O99013 Anemia complicating pregnancy, third trimester: Secondary | ICD-10-CM | POA: Insufficient documentation

## 2023-09-18 MED ORDER — FERROUS SULFATE 325 (65 FE) MG PO TBEC
325.0000 mg | DELAYED_RELEASE_TABLET | ORAL | 2 refills | Status: AC
Start: 2023-09-18 — End: 2024-03-16

## 2023-09-27 ENCOUNTER — Other Ambulatory Visit: Payer: Self-pay

## 2023-09-27 ENCOUNTER — Encounter: Payer: Self-pay | Admitting: Family Medicine

## 2023-09-27 ENCOUNTER — Ambulatory Visit (INDEPENDENT_AMBULATORY_CARE_PROVIDER_SITE_OTHER): Payer: Medicaid Other | Admitting: Family Medicine

## 2023-09-27 VITALS — BP 93/60 | HR 101 | Wt 193.4 lb

## 2023-09-27 DIAGNOSIS — Z3493 Encounter for supervision of normal pregnancy, unspecified, third trimester: Secondary | ICD-10-CM

## 2023-09-27 DIAGNOSIS — Z3009 Encounter for other general counseling and advice on contraception: Secondary | ICD-10-CM

## 2023-09-27 DIAGNOSIS — Z3492 Encounter for supervision of normal pregnancy, unspecified, second trimester: Secondary | ICD-10-CM

## 2023-09-27 DIAGNOSIS — Z3A30 30 weeks gestation of pregnancy: Secondary | ICD-10-CM

## 2023-09-27 DIAGNOSIS — O99013 Anemia complicating pregnancy, third trimester: Secondary | ICD-10-CM

## 2023-09-27 MED ORDER — EPINEPHRINE 0.3 MG/0.3ML IJ SOAJ: 0.3000 mg | INTRAMUSCULAR | 3 refills | Status: DC | PRN

## 2023-09-27 MED ORDER — ALBUTEROL SULFATE HFA 108 (90 BASE) MCG/ACT IN AERS: 2.0000 | INHALATION_SPRAY | Freq: Four times a day (QID) | RESPIRATORY_TRACT | 6 refills | Status: AC | PRN

## 2023-09-27 MED ORDER — CYCLOBENZAPRINE HCL 10 MG PO TABS
10.0000 mg | ORAL_TABLET | Freq: Two times a day (BID) | ORAL | 1 refills | Status: DC | PRN
Start: 2023-09-27 — End: 2023-10-11

## 2023-09-27 NOTE — Patient Instructions (Signed)

## 2023-09-27 NOTE — Progress Notes (Unsigned)
   Subjective:  [name] [name] is a 32 y.o. G2P1001 at [redacted]w[redacted]d being seen today for ongoing prenatal care.  She is currently monitored for the following issues for this low-risk pregnancy and has Supervision of low-risk pregnancy; Obesity affecting pregnancy, antepartum; Alpha thalassemia silent carrier; Nausea and vomiting during pregnancy; Unwanted fertility; and Anemia complicating pregnancy, third trimester on their problem list.  Patient reports no complaints.  Contractions: Irregular. Vag. Bleeding: None.  Movement: Present. Denies leaking of fluid.   The following portions of the patient's history were reviewed and updated as appropriate: allergies, current medications, past family history, past medical history, past social history, past surgical history and problem list. Problem list updated.  Objective:   Vitals:   09/27/23 1156  BP: 93/60  Pulse: (!) 101  Weight: 193 lb 6.4 oz (87.7 kg)    Fetal Status: Fetal Heart Rate (bpm): 157   Movement: Present     General:  Alert, oriented and cooperative. Patient is in no acute distress.  Skin: Skin is warm and dry. No rash noted.   Cardiovascular: Normal heart rate noted  Respiratory: Normal respiratory effort, no problems with respiration noted  Abdomen: Soft, gravid, appropriate for gestational age. Pain/Pressure: Present     Pelvic: Vag. Bleeding: None     Cervical exam deferred        Extremities: Normal range of motion.  Edema: Mild pitting, slight indentation  Mental Status: Normal mood and affect. Normal behavior. Normal judgment and thought content.   Urinalysis:      Assessment and Plan:  Pregnancy: G2P1001 at [redacted]w[redacted]d  1. Encounter for supervision of low-risk pregnancy in third trimester BP and FHR normal Refills of meds sent Lots of aches and pain, discussed normal with advancing gestational age, I do recommend compression socks for leg pain though  2. Unwanted fertility Consent signed 09/13/23, but birthday is  incorrect Resigned today  3. Anemia complicating pregnancy, third trimester Lab Results  Component Value Date   HGB 9.8 (L) 09/13/2023   On po iron   Preterm labor symptoms and general obstetric precautions including but not limited to vaginal bleeding, contractions, leaking of fluid and fetal movement were reviewed in detail with the patient. Please refer to After Visit Summary for other counseling recommendations.  Return in 2 weeks (on 10/11/2023) for [facility], ob visit.   Venora Maples, MD

## 2023-10-02 ENCOUNTER — Ambulatory Visit: Payer: Medicaid Other | Attending: Obstetrics and Gynecology

## 2023-10-02 ENCOUNTER — Other Ambulatory Visit: Payer: Self-pay

## 2023-10-02 DIAGNOSIS — E669 Obesity, unspecified: Secondary | ICD-10-CM

## 2023-10-02 DIAGNOSIS — D563 Thalassemia minor: Secondary | ICD-10-CM | POA: Diagnosis not present

## 2023-10-02 DIAGNOSIS — O99013 Anemia complicating pregnancy, third trimester: Secondary | ICD-10-CM | POA: Diagnosis not present

## 2023-10-02 DIAGNOSIS — O99213 Obesity complicating pregnancy, third trimester: Secondary | ICD-10-CM

## 2023-10-02 DIAGNOSIS — O99212 Obesity complicating pregnancy, second trimester: Secondary | ICD-10-CM | POA: Diagnosis present

## 2023-10-02 DIAGNOSIS — Z3A31 31 weeks gestation of pregnancy: Secondary | ICD-10-CM

## 2023-10-11 ENCOUNTER — Ambulatory Visit (INDEPENDENT_AMBULATORY_CARE_PROVIDER_SITE_OTHER): Payer: Medicaid Other | Admitting: Obstetrics and Gynecology

## 2023-10-11 ENCOUNTER — Other Ambulatory Visit: Payer: Self-pay

## 2023-10-11 VITALS — BP 122/70 | HR 90 | Wt 202.1 lb

## 2023-10-11 DIAGNOSIS — D563 Thalassemia minor: Secondary | ICD-10-CM

## 2023-10-11 DIAGNOSIS — Z3009 Encounter for other general counseling and advice on contraception: Secondary | ICD-10-CM

## 2023-10-11 DIAGNOSIS — Z3492 Encounter for supervision of normal pregnancy, unspecified, second trimester: Secondary | ICD-10-CM

## 2023-10-11 DIAGNOSIS — O99891 Other specified diseases and conditions complicating pregnancy: Secondary | ICD-10-CM

## 2023-10-11 DIAGNOSIS — O99210 Obesity complicating pregnancy, unspecified trimester: Secondary | ICD-10-CM

## 2023-10-11 DIAGNOSIS — O99213 Obesity complicating pregnancy, third trimester: Secondary | ICD-10-CM

## 2023-10-11 DIAGNOSIS — O219 Vomiting of pregnancy, unspecified: Secondary | ICD-10-CM

## 2023-10-11 DIAGNOSIS — Z3A32 32 weeks gestation of pregnancy: Secondary | ICD-10-CM

## 2023-10-11 DIAGNOSIS — M549 Dorsalgia, unspecified: Secondary | ICD-10-CM

## 2023-10-11 DIAGNOSIS — Z3493 Encounter for supervision of normal pregnancy, unspecified, third trimester: Secondary | ICD-10-CM

## 2023-10-11 MED ORDER — PROMETHAZINE HCL 25 MG PO TABS: 25.0000 mg | ORAL_TABLET | Freq: Four times a day (QID) | ORAL | 2 refills | Status: DC | PRN

## 2023-10-11 MED ORDER — CYCLOBENZAPRINE HCL 10 MG PO TABS: 10.0000 mg | ORAL_TABLET | Freq: Two times a day (BID) | ORAL | 1 refills | Status: AC | PRN

## 2023-10-11 NOTE — Progress Notes (Signed)
   PRENATAL VISIT NOTE  Subjective:  [name] [name] is a 32 y.o. G2P1001 at [redacted]w[redacted]d being seen today for ongoing prenatal care.  She is currently monitored for the following issues for this low-risk pregnancy and has Supervision of low-risk pregnancy; Obesity affecting pregnancy, antepartum; Alpha thalassemia silent carrier; Nausea and vomiting during pregnancy; Unwanted fertility; and Anemia complicating pregnancy, third trimester on their problem list.  Patient doing well with no acute concerns today. She reports no complaints.  Contractions: Irritability. Vag. Bleeding: None.  Movement: Present. Denies leaking of fluid.   The following portions of the patient's history were reviewed and updated as appropriate: allergies, current medications, past family history, past medical history, past social history, past surgical history and problem list. Problem list updated.  Objective:   Vitals:   10/11/23 0822  BP: 122/70  Pulse: 90  Weight: 202 lb 1.6 oz (91.7 kg)    Fetal Status: Fetal Heart Rate (bpm): 142 Fundal Height: 32 cm Movement: Present     General:  Alert, oriented and cooperative. Patient is in no acute distress.  Skin: Skin is warm and dry. No rash noted.   Cardiovascular: Normal heart rate noted  Respiratory: Normal respiratory effort, no problems with respiration noted  Abdomen: Soft, gravid, appropriate for gestational age.  Pain/Pressure: Present     Pelvic: Cervical exam deferred        Extremities: Normal range of motion.  Edema: Trace  Mental Status:  Normal mood and affect. Normal behavior. Normal judgment and thought content.   Assessment and Plan:  Pregnancy: G2P1001 at [redacted]w[redacted]d  1. [redacted] weeks gestation   2. Alpha thalassemia silent carrier   3. Obesity affecting pregnancy, antepartum, unspecified obesity type   4. Encounter for supervision of low-risk pregnancy in third trimester Continue routine prenatal care Pt has palpable defect in LLQ at what appears to  be a laparoscopic port site. No bowel palpated.  Pt warned regarding incarcerated hernia and advised to go to MAU if she has intractable pain at the site.  She would need gen surgery eval at that time.  5. Unwanted fertility BTL form previously signed   7. Back pain affecting pregnancy in third trimester Pt s/p fall last week.  Refill of flexeril given.  Pt advised heating pad is ok  - cyclobenzaprine (FLEXERIL) 10 MG tablet; Take 1 tablet (10 mg total) by mouth 2 (two) times daily as needed.  Dispense: 20 tablet; Refill: 1  8. Nausea and vomiting during pregnancy  - promethazine (PHENERGAN) 25 MG tablet; Take 1 tablet (25 mg total) by mouth every 6 (six) hours as needed for nausea or vomiting.  Dispense: 30 tablet; Refill: 2  Preterm labor symptoms and general obstetric precautions including but not limited to vaginal bleeding, contractions, leaking of fluid and fetal movement were reviewed in detail with the patient.  Please refer to After Visit Summary for other counseling recommendations.   Return in about 2 weeks (around 10/25/2023) for ROB, in person.   Mariel Aloe, MD Faculty Attending Center for [facility]

## 2023-10-28 ENCOUNTER — Other Ambulatory Visit: Payer: Self-pay

## 2023-10-28 ENCOUNTER — Ambulatory Visit (INDEPENDENT_AMBULATORY_CARE_PROVIDER_SITE_OTHER): Payer: Medicaid Other | Admitting: Obstetrics and Gynecology

## 2023-10-28 VITALS — BP 127/69 | HR 92 | Wt 203.7 lb

## 2023-10-28 DIAGNOSIS — Z3A34 34 weeks gestation of pregnancy: Secondary | ICD-10-CM

## 2023-10-28 DIAGNOSIS — Z3009 Encounter for other general counseling and advice on contraception: Secondary | ICD-10-CM

## 2023-10-28 DIAGNOSIS — Z3493 Encounter for supervision of normal pregnancy, unspecified, third trimester: Secondary | ICD-10-CM

## 2023-10-28 DIAGNOSIS — D563 Thalassemia minor: Secondary | ICD-10-CM

## 2023-10-28 DIAGNOSIS — O99013 Anemia complicating pregnancy, third trimester: Secondary | ICD-10-CM

## 2023-10-28 DIAGNOSIS — O282 Abnormal cytological finding on antenatal screening of mother: Secondary | ICD-10-CM

## 2023-10-28 NOTE — Progress Notes (Signed)
   PRENATAL VISIT NOTE  Subjective:  [name] [name] is a 32 y.o. G2P1001 at [redacted]w[redacted]d being seen today for ongoing prenatal care.  She is currently monitored for the following issues for this low-risk pregnancy and has Supervision of low-risk pregnancy; Obesity affecting pregnancy, antepartum; Alpha thalassemia silent carrier; Nausea and vomiting during pregnancy; Unwanted fertility; and Anemia complicating pregnancy, third trimester on their problem list.  Patient doing well with no acute concerns today. She reports backache.  Contractions: Irritability. Vag. Bleeding: Scant.  Movement: Present. Denies leaking of fluid.   The following portions of the patient's history were reviewed and updated as appropriate: allergies, current medications, past family history, past medical history, past social history, past surgical history and problem list. Problem list updated.  Objective:   Vitals:   10/28/23 1125  BP: 127/69  Pulse: 92  Weight: 203 lb 11.2 oz (92.4 kg)    Fetal Status: Fetal Heart Rate (bpm): 139 Fundal Height: 34 cm Movement: Present     General:  Alert, oriented and cooperative. Patient is in no acute distress.  Skin: Skin is warm and dry. No rash noted.   Cardiovascular: Normal heart rate noted  Respiratory: Normal respiratory effort, no problems with respiration noted  Abdomen: Soft, gravid, appropriate for gestational age.  Pain/Pressure: Present     Pelvic: Cervical exam deferred        Extremities: Normal range of motion.  Edema: Trace  Mental Status:  Normal mood and affect. Normal behavior. Normal judgment and thought content.   Assessment and Plan:  Pregnancy: G2P1001 at [redacted]w[redacted]d  1. [redacted] weeks gestation of pregnancy   2. Alpha thalassemia silent carrier   3. Anemia complicating pregnancy, third trimester   4. Encounter for supervision of low-risk pregnancy in third trimester Continue routine prenatal care  5. Unwanted fertility Tubal form previously  signed  Preterm labor symptoms and general obstetric precautions including but not limited to vaginal bleeding, contractions, leaking of fluid and fetal movement were reviewed in detail with the patient.  Please refer to After Visit Summary for other counseling recommendations.   Return in about 1 week (around 11/04/2023) for ROB, in person.   Mariel Aloe, MD Faculty Attending Center for [facility]

## 2023-10-28 NOTE — Progress Notes (Signed)
A lot of pain in lower abdominal area & lower back, started to spot 2 days ago.

## 2023-11-05 ENCOUNTER — Other Ambulatory Visit: Payer: Self-pay

## 2023-11-05 ENCOUNTER — Ambulatory Visit (INDEPENDENT_AMBULATORY_CARE_PROVIDER_SITE_OTHER): Payer: Medicaid Other | Admitting: Family Medicine

## 2023-11-05 ENCOUNTER — Encounter: Payer: Self-pay | Admitting: Family Medicine

## 2023-11-05 VITALS — BP 108/65 | HR 96 | Wt 206.0 lb

## 2023-11-05 DIAGNOSIS — Z3009 Encounter for other general counseling and advice on contraception: Secondary | ICD-10-CM

## 2023-11-05 DIAGNOSIS — R079 Chest pain, unspecified: Secondary | ICD-10-CM

## 2023-11-05 DIAGNOSIS — D563 Thalassemia minor: Secondary | ICD-10-CM

## 2023-11-05 DIAGNOSIS — E669 Obesity, unspecified: Secondary | ICD-10-CM

## 2023-11-05 DIAGNOSIS — O99013 Anemia complicating pregnancy, third trimester: Secondary | ICD-10-CM

## 2023-11-05 DIAGNOSIS — O26893 Other specified pregnancy related conditions, third trimester: Secondary | ICD-10-CM

## 2023-11-05 DIAGNOSIS — Z3493 Encounter for supervision of normal pregnancy, unspecified, third trimester: Secondary | ICD-10-CM

## 2023-11-05 DIAGNOSIS — Z3A35 35 weeks gestation of pregnancy: Secondary | ICD-10-CM

## 2023-11-05 DIAGNOSIS — O99213 Obesity complicating pregnancy, third trimester: Secondary | ICD-10-CM

## 2023-11-05 DIAGNOSIS — O99210 Obesity complicating pregnancy, unspecified trimester: Secondary | ICD-10-CM

## 2023-11-05 NOTE — Progress Notes (Signed)
   PRENATAL VISIT NOTE  Subjective:  [name] [name] is a 32 y.o. G2P1001 at [redacted]w[redacted]d being seen today for ongoing prenatal care.  She is currently monitored for the following issues for this high-risk pregnancy and has Supervision of low-risk pregnancy; Obesity affecting pregnancy, antepartum; Alpha thalassemia silent carrier; Nausea and vomiting during pregnancy; Unwanted fertility; and Anemia complicating pregnancy, third trimester on their problem list.  Patient reports no bleeding, no contractions, no cramping, and no leaking.  Contractions: Irritability. Vag. Bleeding: Scant (spotting 2 days ago).  Movement: Present. Denies leaking of fluid.   The following portions of the patient's history were reviewed and updated as appropriate: allergies, current medications, past family history, past medical history, past social history, past surgical history and problem list.   Objective:   Vitals:   11/05/23 1129  BP: 108/65  Pulse: 96  Weight: 206 lb (93.4 kg)    Fetal Status: Fetal Heart Rate (bpm): 138   Movement: Present     General:  Alert, oriented and cooperative. Patient is in no acute distress.  Skin: Skin is warm and dry. No rash noted.   Cardiovascular: Normal heart rate noted  Respiratory: Patient with normal work of breathing overall.  Has a episodes of fast deep breaths.  Speaking in full sentences.  Abdomen: Soft, gravid, appropriate for gestational age.  Pain/Pressure: Present     Pelvic: Cervical exam performed in the presence of a chaperone      closed/thick/-2  Extremities: Normal range of motion.  Edema: Trace  Mental Status: Normal mood and affect. Normal behavior. Normal judgment and thought content.   Assessment and Plan:  Pregnancy: G2P1001 at [redacted]w[redacted]d 1. Encounter for supervision of low-risk pregnancy in third trimester FHR and BP appropriate today  2. Anemia complicating pregnancy, third trimester Most recent hemoglobin 9.8  3. Alpha thalassemia silent  carrier  4. [redacted] weeks gestation of pregnancy  5. Obesity affecting pregnancy, antepartum, unspecified obesity type Continue monitoring per MFM  6. Unwanted fertility Tubal papers previously signed  7. Chest pain, unspecified type Patient reports chest pain which started this morning along with shortness of breath.  Patient reports she has been under considerable stress and is worried about losing her house.  Patient also reports of burning in the center of her chest.  This could be GERD related.  Could also be anxiety related.  Discussed options and she will go to the MAU for evaluation.  Preterm labor symptoms and general obstetric precautions including but not limited to vaginal bleeding, contractions, leaking of fluid and fetal movement were reviewed in detail with the patient. Please refer to After Visit Summary for other counseling recommendations.   No follow-ups on file.  Future Appointments  Date Time Provider Department Center  11/13/2023  9:15 AM Anyanwu, Jethro Bastos, MD [facility]    Celedonio Savage, MD

## 2023-11-13 ENCOUNTER — Encounter: Payer: Self-pay | Admitting: Obstetrics & Gynecology

## 2023-11-13 ENCOUNTER — Ambulatory Visit (INDEPENDENT_AMBULATORY_CARE_PROVIDER_SITE_OTHER): Payer: Medicaid Other | Admitting: Obstetrics & Gynecology

## 2023-11-13 ENCOUNTER — Other Ambulatory Visit (HOSPITAL_COMMUNITY)
Admission: RE | Admit: 2023-11-13 | Discharge: 2023-11-13 | Disposition: A | Discharge status: Home / Self Care | Payer: Medicaid Other | Source: Ambulatory Visit | Attending: Obstetrics & Gynecology | Admitting: Obstetrics & Gynecology

## 2023-11-13 ENCOUNTER — Other Ambulatory Visit: Payer: Self-pay

## 2023-11-13 VITALS — BP 116/77 | HR 105 | Wt 207.6 lb

## 2023-11-13 DIAGNOSIS — Z3A36 36 weeks gestation of pregnancy: Secondary | ICD-10-CM | POA: Insufficient documentation

## 2023-11-13 DIAGNOSIS — Z3A37 37 weeks gestation of pregnancy: Secondary | ICD-10-CM

## 2023-11-13 DIAGNOSIS — Z3493 Encounter for supervision of normal pregnancy, unspecified, third trimester: Secondary | ICD-10-CM | POA: Diagnosis present

## 2023-11-13 NOTE — Progress Notes (Signed)
   PRENATAL VISIT NOTE  Subjective:  [name] [name] is a 32 y.o. G2P1001 at [redacted]w[redacted]d being seen today for ongoing prenatal care.  She is currently monitored for the following issues for this low-risk pregnancy and has Supervision of low-risk pregnancy; Obesity affecting pregnancy, antepartum; Alpha thalassemia silent carrier; Nausea and vomiting during pregnancy; Unwanted fertility; and Anemia complicating pregnancy, third trimester on their problem list.  Patient reports rare contractions and persistent pelvic pressure.  Contractions: Irritability. Vag. Bleeding: None.  Movement: Present. Denies leaking of fluid.   The following portions of the patient's history were reviewed and updated as appropriate: allergies, current medications, past family history, past medical history, past social history, past surgical history and problem list.   Objective:   Vitals:   11/13/23 0845  BP: 116/77  Pulse: (!) 105  Weight: 207 lb 9.6 oz (94.2 kg)    Fetal Status: Fetal Heart Rate (bpm): 147   Movement: Present  Presentation: Vertex  General:  Alert, oriented and cooperative. Patient is in no acute distress.  Skin: Skin is warm and dry. No rash noted.   Cardiovascular: Normal heart rate noted  Respiratory: Normal respiratory effort, no problems with respiration noted  Abdomen: Soft, gravid, appropriate for gestational age.  Pain/Pressure: Present     Pelvic: Cervical exam performed in the presence of a chaperone Dilation: Fingertip Effacement (%): 50 Station: -3. Pelvic cultures obtained.  Extremities: Normal range of motion.  Edema: Trace  Mental Status: Normal mood and affect. Normal behavior. Normal judgment and thought content.   Assessment and Plan:  Pregnancy: G2P1001 at [redacted]w[redacted]d 1. [redacted] weeks gestation of pregnancy 2. Encounter for supervision of low-risk pregnancy in third trimester (Primary) Cultures obtained, will follow up results and manage accordingly. - Strep Gp B NAA - Cervicovaginal  ancillary only Labor symptoms and general obstetric precautions including but not limited to vaginal bleeding, contractions, leaking of fluid and fetal movement were reviewed in detail with the patient. Please refer to After Visit Summary for other counseling recommendations.   Return in about 1 week (around 11/20/2023) for OFFICE OB VISIT (MD or APP).  No future appointments.  Jaynie Collins, MD

## 2023-11-13 NOTE — Patient Instructions (Signed)
Return to office for any scheduled appointments. Call the office or go to the MAU at [facility] [place] if: You begin to have strong, frequent contractions Your water breaks.  Sometimes it is a big gush of fluid, sometimes it is just a trickle that keeps getting your underwear wet or running down your legs You have vaginal bleeding.  It is normal to have a small amount of spotting if your cervix was checked.  You do not feel your baby moving like normal.  If you do not, get something to eat and drink and lay down and focus on feeling your baby move.   If your baby is still not moving like normal, you should call the office or go to MAU. Any other obstetric concerns.  

## 2023-11-14 LAB — CERVICOVAGINAL ANCILLARY ONLY
Chlamydia: NEGATIVE
Comment: NEGATIVE
Comment: NORMAL
Neisseria Gonorrhea: NEGATIVE

## 2023-11-15 LAB — STREP GP B NAA: Strep Gp B NAA: NEGATIVE

## 2023-11-18 NOTE — Progress Notes (Unsigned)
   PRENATAL VISIT NOTE  Subjective:  [name] [name] is a 32 y.o. G2P1001 at [redacted]w[redacted]d being seen today for ongoing prenatal care.  She is currently monitored for the following issues for this low-risk pregnancy and has Supervision of low-risk pregnancy; Obesity affecting pregnancy, antepartum; Alpha thalassemia silent carrier; Nausea and vomiting during pregnancy; Unwanted fertility; and Anemia complicating pregnancy, third trimester on their problem list.  Patient reports  low back pain .  Contractions: Irritability. Vag. Bleeding: None.  Movement: Present. Denies leaking of fluid.   The following portions of the patient's history were reviewed and updated as appropriate: allergies, current medications, past family history, past medical history, past social history, past surgical history and problem list.   Objective:   Vitals:   11/21/23 1439  BP: 119/80  Pulse: 100  Weight: 210 lb (95.3 kg)    Fetal Status: Fetal Heart Rate (bpm): 147 Fundal Height: 40 cm Movement: Present     General:  Alert, oriented and cooperative. Patient is in no acute distress.  Skin: Skin is warm and dry. No rash noted.   Cardiovascular: Normal heart rate noted  Respiratory: Normal respiratory effort, no problems with respiration noted  Abdomen: Soft, gravid, appropriate for gestational age.  Pain/Pressure: Present     Pelvic: Cervical exam performed in the presence of a chaperone Dilation: Closed Effacement (%): Thick Station: -3  Extremities: Normal range of motion.  Edema: Trace  Mental Status: Normal mood and affect. Normal behavior. Normal judgment and thought content.   Assessment and Plan:  Pregnancy: G2P1001 at [redacted]w[redacted]d 1. Unwanted fertility (Primary) Signed papers on 10/18.   2. Encounter for supervision of low-risk pregnancy in third trimester GBS neg Scheduled for PD IOL at 41 weeks. Will also schedule for 39w EIOL - scheduled for 1/3. Discussed process with patient and set expectations. Leopold's  to 8 lbs.  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 Will start FMLA now. Work note given.   3. Obesity affecting pregnancy, antepartum, unspecified obesity type  4. Anemia complicating pregnancy, third trimester Last Hgb was 10/18 - on PO iron   5. Nausea and vomiting during pregnancy  6. Pregnancy with 38 completed weeks gestation   Term labor symptoms and general obstetric precautions including but not limited to vaginal bleeding, contractions, leaking of fluid and fetal movement were reviewed in detail with the patient. Please refer to After Visit Summary for other counseling recommendations.   Return in about 1 week (around 11/28/2023) for OB VISIT, MD or APP.  No future appointments.   Milas Hock, MD

## 2023-11-21 ENCOUNTER — Other Ambulatory Visit: Payer: Self-pay

## 2023-11-21 ENCOUNTER — Ambulatory Visit: Payer: Medicaid Other | Admitting: Obstetrics and Gynecology

## 2023-11-21 VITALS — BP 119/80 | HR 100 | Wt 210.0 lb

## 2023-11-21 DIAGNOSIS — E669 Obesity, unspecified: Secondary | ICD-10-CM

## 2023-11-21 DIAGNOSIS — O219 Vomiting of pregnancy, unspecified: Secondary | ICD-10-CM

## 2023-11-21 DIAGNOSIS — Z3009 Encounter for other general counseling and advice on contraception: Secondary | ICD-10-CM

## 2023-11-21 DIAGNOSIS — O99213 Obesity complicating pregnancy, third trimester: Secondary | ICD-10-CM

## 2023-11-21 DIAGNOSIS — Z3493 Encounter for supervision of normal pregnancy, unspecified, third trimester: Secondary | ICD-10-CM

## 2023-11-21 DIAGNOSIS — D649 Anemia, unspecified: Secondary | ICD-10-CM

## 2023-11-21 DIAGNOSIS — O99013 Anemia complicating pregnancy, third trimester: Secondary | ICD-10-CM

## 2023-11-21 DIAGNOSIS — Z3A38 38 weeks gestation of pregnancy: Secondary | ICD-10-CM

## 2023-11-21 DIAGNOSIS — O99210 Obesity complicating pregnancy, unspecified trimester: Secondary | ICD-10-CM

## 2023-11-22 ENCOUNTER — Other Ambulatory Visit: Payer: Self-pay

## 2023-11-22 ENCOUNTER — Encounter (HOSPITAL_COMMUNITY): Payer: Self-pay | Admitting: Obstetrics & Gynecology

## 2023-11-22 ENCOUNTER — Inpatient Hospital Stay (HOSPITAL_COMMUNITY)
Admission: AD | Admit: 2023-11-22 | Discharge: 2023-11-22 | Disposition: A | Discharge status: Home / Self Care | Payer: Medicaid Other | Attending: Obstetrics & Gynecology | Admitting: Obstetrics & Gynecology

## 2023-11-22 DIAGNOSIS — R102 Pelvic and perineal pain: Secondary | ICD-10-CM | POA: Diagnosis not present

## 2023-11-22 DIAGNOSIS — O99891 Other specified diseases and conditions complicating pregnancy: Secondary | ICD-10-CM | POA: Diagnosis not present

## 2023-11-22 DIAGNOSIS — O26893 Other specified pregnancy related conditions, third trimester: Secondary | ICD-10-CM | POA: Insufficient documentation

## 2023-11-22 DIAGNOSIS — Z3A38 38 weeks gestation of pregnancy: Secondary | ICD-10-CM | POA: Insufficient documentation

## 2023-11-22 DIAGNOSIS — M5431 Sciatica, right side: Secondary | ICD-10-CM | POA: Diagnosis not present

## 2023-11-22 DIAGNOSIS — M5432 Sciatica, left side: Secondary | ICD-10-CM | POA: Insufficient documentation

## 2023-11-22 DIAGNOSIS — M549 Dorsalgia, unspecified: Secondary | ICD-10-CM

## 2023-11-22 LAB — URINALYSIS, ROUTINE W REFLEX MICROSCOPIC
Bilirubin Urine: NEGATIVE
Glucose, UA: NEGATIVE mg/dL
Hgb urine dipstick: NEGATIVE
Ketones, ur: NEGATIVE mg/dL
Nitrite: NEGATIVE
Protein, ur: NEGATIVE mg/dL
Specific Gravity, Urine: 1.013 (ref 1.005–1.030)
pH: 7 (ref 5.0–8.0)

## 2023-11-22 MED ORDER — OXYCODONE-ACETAMINOPHEN 5-325 MG PO TABS
2.0000 | ORAL_TABLET | Freq: Once | ORAL | Status: AC
  Administered 2023-11-22: 2 via ORAL
  Filled 2023-11-22: qty 2

## 2023-11-22 MED ORDER — ONDANSETRON 4 MG PO TBDP
4.0000 mg | ORAL_TABLET | Freq: Once | ORAL | Status: AC
  Administered 2023-11-22: 4 mg via ORAL
  Filled 2023-11-22: qty 1

## 2023-11-22 MED ORDER — OXYCODONE-ACETAMINOPHEN 5-325 MG PO TABS: 1.0000 | ORAL_TABLET | Freq: Four times a day (QID) | ORAL | 0 refills | Status: DC | PRN

## 2023-11-22 MED ORDER — PREDNISONE 20 MG PO TABS: 20.0000 mg | ORAL_TABLET | Freq: Two times a day (BID) | ORAL | 0 refills | Status: AC

## 2023-11-22 MED ORDER — CYCLOBENZAPRINE HCL 5 MG PO TABS
10.0000 mg | ORAL_TABLET | Freq: Once | ORAL | Status: AC
Start: 2023-11-22 — End: 2023-11-22
  Administered 2023-11-22: 10 mg via ORAL
  Filled 2023-11-22: qty 2

## 2023-11-22 NOTE — MAU Provider Note (Signed)
Chief Complaint:  Back Pain   Event Date/Time   First Provider Initiated Contact with Patient 11/22/23 [phone]      HPI: [name] [name] is a 32 y.o. G2P1001 at [redacted]w[redacted]d who presents to maternity admissions reporting pelvic/back pain, worsening while at work tonight.  Pain is in her low back, radiating down the back of both legs. There is pain with walking or position changing in bed. There are no regular contractions.  She reports good fetal movement, denies LOF, vaginal bleeding, vaginal itching/burning, urinary symptoms, h/a, dizziness, n/v, or fever/chills.     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   Sprain of MCL (medial collateral ligament) of knee 10/12/2021    Past obstetric history: OB History  Gravida Para Term Preterm AB Living  2 1 1   1   SAB IAB Ectopic Multiple Live Births      1    # Outcome Date GA Lbr Len/2nd Weight Sex Type Anes PTL Lv  2 Current           1 Term 05/15/12 [redacted]w[redacted]d 15:52 / 00:19 2892 g M Vag-Vacuum EPI  LIV    Past Surgical History: Past Surgical History:  Procedure Laterality Date   BIOPSY  05/27/2018   Procedure: BIOPSY;  Surgeon: Graylin Shiver, MD;  [facility]COPY;  Service: Endoscopy;;   ESOPHAGOGASTRODUODENOSCOPY (EGD) WITH PROPOFOL N/A 09/11/2017   Procedure: ESOPHAGOGASTRODUODENOSCOPY (EGD) WITH PROPOFOL;  Surgeon: Charlott Rakes, MD;  Location: [facility] ENDOSCOPY;  Service: Endoscopy;  Laterality: N/A;   ESOPHAGOGASTRODUODENOSCOPY (EGD) WITH PROPOFOL N/A 02/20/2018   Procedure: ESOPHAGOGASTRODUODENOSCOPY (EGD) WITH PROPOFOL;  Surgeon: Rachael Fee, MD;  Location: [facility] ENDOSCOPY;  Service: Endoscopy;  Laterality: N/A;    ESOPHAGOGASTRODUODENOSCOPY (EGD) WITH PROPOFOL N/A 05/27/2018   Procedure: ESOPHAGOGASTRODUODENOSCOPY (EGD) WITH PROPOFOL WITH CLIP PLACEMENT;  Surgeon: Graylin Shiver, MD;  Location: [facility] ENDOSCOPY;  Service: Endoscopy;  Laterality: N/A;   INSERTION OF MESH N/A 12/26/2016   Procedure: INSERTION OF MESH;  Surgeon: Abigail Miyamoto, MD;  Location: [facility] ORS;  Service: General;  Laterality: N/A;   KNEE ARTHROSCOPY WITH MEDIAL PATELLAR FEMORAL LIGAMENT RECONSTRUCTION Left 03/09/2022   Procedure: LEFT KNEE ARTHROSCOPY WITH MEDIAL PATELLAR FEMORAL LIGAMENT RECONSTRUCTION WILL ALLOGRAFT;  Surgeon: Yolonda Kida, MD;  Location: [facility] ORS;  Service: Orthopedics;  Laterality: Left;   LAPAROSCOPY N/A 12/26/2016   Procedure: LAPAROSCOPY DIAGNOSTIC;  Surgeon: Abigail Miyamoto, MD;  Location: [facility] ORS;  Service: General;  Laterality: N/A;   UMBILICAL HERNIA REPAIR N/A 12/26/2016   Procedure: HERNIA REPAIR UMBILICAL ADULT;  Surgeon: Abigail Miyamoto, MD;  Location: [facility] ORS;  Service: General;  Laterality: N/A;    Family History: Family History  Problem Relation Age of Onset   Hypertension Mother    Stroke Mother    CAD Mother 53       MI on 10/18   Colon cancer Neg Hx    Stomach cancer Neg Hx    Rectal cancer Neg Hx     Social History: Social History   Tobacco Use   Smoking status: Former    Current packs/day: 0.00    Average packs/day: 0.3 packs/day for 4.0 years (1.0 ttl pk-yrs)  Types: Cigarettes    Start date: 2015    Quit date: 2019    Years since quitting: 5.9   Smokeless tobacco: Never  Vaping Use   Vaping status: Never Used  Substance Use Topics   Alcohol use: Not Currently    Comment: rarely   Drug use: No    Allergies:  Allergies  Allergen Reactions   Iohexol Hives, Shortness Of Breath, Itching, Nausea And Vomiting, Swelling and Other (See Comments)    Also caused chest pain PER DR BARRY, PRE MED PROTOCOL SHOULD BE ADMINISTERED PRIOR TO SCANS IN THE FUTURE.  PT HAD A  SIGNIFICANT REACTION.  [facility]  08/18/17 Patient had a reaction of SOB, swelling even though premedicated. Physician states no further contrast imaging in the future.   Peanut-Containing Drug Products Anaphylaxis and Swelling    Facial swelling   Other Swelling    Tree nuts cause facial swelling   Sulfa Antibiotics Swelling    Facial swelling (reported by [facility] 2013)   Iodinated Contrast Media Rash    Meds:  Medications Prior to Admission  Medication Sig Dispense Refill Last Dose/Taking   acetaminophen (TYLENOL) 500 MG tablet Take 500 mg by mouth every 6 (six) hours as needed.   11/21/2023 at  6:00 PM   albuterol (VENTOLIN HFA) 108 (90 Base) MCG/ACT inhaler Inhale 2 puffs into the lungs every 6 (six) hours as needed for wheezing or shortness of breath. 1 each 6 11/21/2023 at 10:00 PM   ferrous sulfate 325 (65 FE) MG EC tablet Take 1 tablet (325 mg total) by mouth every other day. 30 tablet 2 11/21/2023   Prenatal Vit-Fe Phos-FA-Omega (VITAFOL GUMMIES) 3.33-0.333-34.8 MG CHEW Chew 1 tablet by mouth daily. 90 tablet 5 11/21/2023   promethazine (PHENERGAN) 25 MG tablet Take 1 tablet (25 mg total) by mouth every 6 (six) hours as needed for nausea or vomiting. 30 tablet 2 11/21/2023   cyclobenzaprine (FLEXERIL) 10 MG tablet Take 1 tablet (10 mg total) by mouth 2 (two) times daily as needed. 20 tablet 1 11/20/2023   EPINEPHrine 0.3 mg/0.3 mL IJ SOAJ injection Inject 0.3 mg into the muscle as needed for anaphylaxis. 2 each 3    scopolamine (TRANSDERM-SCOP) 1 MG/3DAYS Place 1 patch (1.5 mg total) onto the skin every 3 (three) days. 4 patch 1     ROS:  Review of Systems  Constitutional:  Negative for chills, fatigue and fever.  Eyes:  Negative for visual disturbance.  Respiratory:  Negative for shortness of breath.   Cardiovascular:  Negative for chest pain.  Gastrointestinal:  Negative for abdominal pain, nausea and vomiting.  Genitourinary:  Positive for pelvic pain. Negative for difficulty  urinating, dysuria, flank pain, vaginal bleeding, vaginal discharge and vaginal pain.  Musculoskeletal:  Positive for back pain and myalgias.  Neurological:  Negative for dizziness and headaches.  Psychiatric/Behavioral: Negative.       I have reviewed patient's Past Medical Hx, Surgical Hx, Family Hx, Social Hx, medications and allergies.   Physical Exam  Patient Vitals for the past 24 hrs:  BP Temp Temp src Pulse Resp SpO2 Height Weight  11/22/23 0206 120/74 98.2 F (36.8 C) Oral 82 19 99 % 5\' 2"  (1.575 m) 95.3 kg   Constitutional: Well-developed, well-nourished female in no acute distress.  Cardiovascular: normal rate Respiratory: normal effort GI: Abd soft, non-tender, gravid appropriate for gestational age.  MS: Extremities nontender, no edema, normal ROM Neurologic: Alert and oriented x 4.  GU: Neg CVAT.  PELVIC  EXAM:   Dilation: 1 Effacement (%): 50 Cervical Position: Posterior Presentation: Vertex Exam by:: Sharen Counter, CNM  FHT:  Baseline 135, moderate variability, accelerations present, no decelerations Contractions: irregular, mild to palpation   Labs: Results for orders placed or performed during the hospital encounter of 11/22/23 (from the past 24 hours)  Urinalysis, Routine w reflex microscopic -Urine, Clean Catch     Status: Abnormal   Collection Time: 11/22/23  2:00 AM  Result Value Ref Range   Color, Urine YELLOW YELLOW   APPearance CLEAR CLEAR   Specific Gravity, Urine 1.013 1.005 - 1.030   pH 7.0 5.0 - 8.0   Glucose, UA NEGATIVE NEGATIVE mg/dL   Hgb urine dipstick NEGATIVE NEGATIVE   Bilirubin Urine NEGATIVE NEGATIVE   Ketones, ur NEGATIVE NEGATIVE mg/dL   Protein, ur NEGATIVE NEGATIVE mg/dL   Nitrite NEGATIVE NEGATIVE   Leukocytes,Ua SMALL (A) NEGATIVE   RBC / HPF 0-5 0 - 5 RBC/hpf   WBC, UA 0-5 0 - 5 WBC/hpf   Bacteria, UA FEW (A) NONE SEEN   Squamous Epithelial / HPF 0-5 0 - 5 /HPF   Mucus PRESENT    O/Positive/-- (07/19  1221)  Imaging:  No results found.  MAU Course/MDM: Orders Placed This Encounter  Procedures   Urinalysis, Routine w reflex microscopic -Urine, Clean Catch   Discharge patient    Meds ordered this encounter  Medications   cyclobenzaprine (FLEXERIL) tablet 10 mg   oxyCODONE-acetaminophen (PERCOCET/ROXICET) 5-325 MG per tablet 2 tablet    Refill:  0   predniSONE (DELTASONE) 20 MG tablet    Sig: Take 1 tablet (20 mg total) by mouth 2 (two) times daily with a meal for 5 days.    Dispense:  10 tablet    Refill:  0    Supervising Provider:   Myna Hidalgo [[id number]]   oxyCODONE-acetaminophen (PERCOCET/ROXICET) 5-325 MG tablet    Sig: Take 1 tablet by mouth every 6 (six) hours as needed for severe pain (pain score 7-10).    Dispense:  6 tablet    Refill:  0    Supervising Provider:   Myna Hidalgo [[id number]]   ondansetron (ZOFRAN-ODT) disintegrating tablet 4 mg     NST reviewed and reactive Cervix unchanged in 2+ hours in MAU, no evidence of active labor Pain c/w MSK pain and sciatica Pt tearful, difficult to move in bed or walk Flexeril given without relief Percocet x 2 tabs given in MAU Rx for Percocet 1 tab Q 6 hours PRN x 6 tablets Rx for prednisone burst, 20 mg BID x 5 days Keep scheduled appts in office, keep IOL appt 11/28/22 Return to MAU with signs of labor or emergencies  Assessment: 1. Bilateral sciatica   2. Back pain affecting pregnancy in third trimester   3. Pelvic pain affecting pregnancy in third trimester, antepartum   4. [redacted] weeks gestation of pregnancy     Plan: Discharge home Labor precautions and fetal kick counts  Follow-up Information     [facility] Follow up.   Specialty: Obstetrics and Gynecology Why: As scheduled Contact information: [address] [place] [address] [phone]        [facility] Follow up.   Specialty: Obstetrics and  Gynecology Why: As needed for emergencies Contact information: [address] [place] [address] [phone]               Allergies as of  11/22/2023       Reactions   Iohexol Hives, Shortness Of Breath, Itching, Nausea And Vomiting, Swelling, Other (See Comments)   Also caused chest pain PER DR BARRY, PRE MED PROTOCOL SHOULD BE ADMINISTERED PRIOR TO SCANS IN THE FUTURE.  PT HAD A SIGNIFICANT REACTION.  [facility] 08/18/17 Patient had a reaction of SOB, swelling even though premedicated. Physician states no further contrast imaging in the future.   Peanut-containing Drug Products Anaphylaxis, Swelling   Facial swelling   Other Swelling   Tree nuts cause facial swelling   Sulfa Antibiotics Swelling   Facial swelling (reported by [facility] 2013)   Iodinated Contrast Media Rash        Medication List     TAKE these medications    acetaminophen 500 MG tablet Commonly known as: TYLENOL Take 500 mg by mouth every 6 (six) hours as needed.   albuterol 108 (90 Base) MCG/ACT inhaler Commonly known as: VENTOLIN HFA Inhale 2 puffs into the lungs every 6 (six) hours as needed for wheezing or shortness of breath.   cyclobenzaprine 10 MG tablet Commonly known as: FLEXERIL Take 1 tablet (10 mg total) by mouth 2 (two) times daily as needed.   EPINEPHrine 0.3 mg/0.3 mL Soaj injection Commonly known as: EPI-PEN Inject 0.3 mg into the muscle as needed for anaphylaxis.   ferrous sulfate 325 (65 FE) MG EC tablet Take 1 tablet (325 mg total) by mouth every other day.   oxyCODONE-acetaminophen 5-325 MG tablet Commonly known as: PERCOCET/ROXICET Take 1 tablet by mouth every 6 (six) hours as needed for severe pain (pain score 7-10).   predniSONE 20 MG tablet Commonly known as: DELTASONE Take 1 tablet (20 mg total) by mouth 2 (two) times daily with a meal for 5 days.   promethazine 25 MG tablet Commonly known as: PHENERGAN Take 1 tablet (25 mg total) by mouth  every 6 (six) hours as needed for nausea or vomiting.   scopolamine 1 MG/3DAYS Commonly known as: TRANSDERM-SCOP Place 1 patch (1.5 mg total) onto the skin every 3 (three) days.   Vitafol Gummies 3.33-0.333-34.8 MG Chew Chew 1 tablet by mouth daily.        Sharen Counter Certified Nurse-Midwife 11/22/2023 7:50 AM

## 2023-11-22 NOTE — MAU Note (Addendum)
.  [name] [name] is a 32 y.o. at [redacted]w[redacted]d here in MAU reporting: here by EMS c/o constant lower back pain that is mainly left sided and shoots down both legs. States pain has been on going for 1 week, but got worse tonight while she was at work. Denies VB or LOF. +FM. Reports emesis x3 while at work, but denies current nausea. Took tylenol 500mg  at 1800.    Onset of complaint: 1 week  Pain score: 10 Vitals:   11/22/23 0206  BP: 120/74  Pulse: 82  Resp: 19  Temp: 98.2 F (36.8 C)  SpO2: 99%     FHT:142 Lab orders placed from triage: ua

## 2023-11-26 ENCOUNTER — Other Ambulatory Visit: Payer: Self-pay

## 2023-11-26 ENCOUNTER — Ambulatory Visit: Payer: Medicaid Other | Admitting: Obstetrics and Gynecology

## 2023-11-26 VITALS — BP 115/78 | HR 92 | Wt 210.8 lb

## 2023-11-26 DIAGNOSIS — Z3493 Encounter for supervision of normal pregnancy, unspecified, third trimester: Secondary | ICD-10-CM

## 2023-11-26 DIAGNOSIS — Z3009 Encounter for other general counseling and advice on contraception: Secondary | ICD-10-CM

## 2023-11-26 DIAGNOSIS — Z3A38 38 weeks gestation of pregnancy: Secondary | ICD-10-CM

## 2023-11-26 NOTE — Progress Notes (Signed)
   PRENATAL VISIT NOTE  Subjective:  [name] [name] is a 32 y.o. G2P1001 at [redacted]w[redacted]d being seen today for ongoing prenatal care.  She is currently monitored for the following issues for this low-risk pregnancy and has Supervision of low-risk pregnancy; Obesity affecting pregnancy, antepartum; Alpha thalassemia silent carrier; Nausea and vomiting during pregnancy; Unwanted fertility; and Anemia complicating pregnancy, third trimester on their problem list.  Patient reports occasional contractions.  Contractions: Irritability. Vag. Bleeding: None.  Movement: Present. Denies leaking of fluid.   The following portions of the patient's history were reviewed and updated as appropriate: allergies, current medications, past family history, past medical history, past social history, past surgical history and problem list.   Objective:   Vitals:   11/26/23 0946  BP: 115/78  Pulse: 92  Weight: 210 lb 12.8 oz (95.6 kg)    Fetal Status: Fetal Heart Rate (bpm): 145 Fundal Height: 41 cm Movement: Present  Presentation: Vertex  General:  Alert, oriented and cooperative. Patient is in no acute distress.  Skin: Skin is warm and dry. No rash noted.   Cardiovascular: Normal heart rate noted  Respiratory: Normal respiratory effort, no problems with respiration noted  Abdomen: Soft, gravid, appropriate for gestational age.  Pain/Pressure: Present     Pelvic: Cervical exam performed in the presence of a chaperone Dilation: 1.5 Effacement (%): 10 Station: Ballotable  Extremities: Normal range of motion.  Edema: Trace  Mental Status: Normal mood and affect. Normal behavior. Normal judgment and thought content.   Assessment and Plan:  Pregnancy: G2P1001 at [redacted]w[redacted]d 1. [redacted] weeks gestation of pregnancy (Primary) Patient amenable to so membranes stripped. Last baby only six pounds and efw this baby 3800g, pelvis feels adequate. GTT wnl GBS neg Elective IOL for 1/3 if space available  2. Unwanted fertility Btl  papers signed 10/18  Term labor symptoms and general obstetric precautions including but not limited to vaginal bleeding, contractions, leaking of fluid and fetal movement were reviewed in detail with the patient. Please refer to After Visit Summary for other counseling recommendations.   No follow-ups on file.  Future Appointments  Date Time Provider Department Center  12/11/2023  6:45 AM [facility] SCHED ROOM [facility] None    Bebe Furry, MD

## 2023-11-27 ENCOUNTER — Inpatient Hospital Stay (HOSPITAL_COMMUNITY)
Admission: AD | Admit: 2023-11-27 | Discharge: 2023-11-27 | Disposition: A | Discharge status: Home / Self Care | Payer: Medicaid Other | Attending: Family Medicine | Admitting: Family Medicine

## 2023-11-27 ENCOUNTER — Encounter (HOSPITAL_COMMUNITY): Payer: Self-pay | Admitting: Family Medicine

## 2023-11-27 DIAGNOSIS — Z3A39 39 weeks gestation of pregnancy: Secondary | ICD-10-CM | POA: Diagnosis not present

## 2023-11-27 DIAGNOSIS — O471 False labor at or after 37 completed weeks of gestation: Secondary | ICD-10-CM | POA: Diagnosis present

## 2023-11-27 LAB — URINALYSIS, ROUTINE W REFLEX MICROSCOPIC
Bilirubin Urine: NEGATIVE
Glucose, UA: NEGATIVE mg/dL
Hgb urine dipstick: NEGATIVE
Ketones, ur: NEGATIVE mg/dL
Nitrite: NEGATIVE
Protein, ur: NEGATIVE mg/dL
Specific Gravity, Urine: 1.012 (ref 1.005–1.030)
pH: 7 (ref 5.0–8.0)

## 2023-11-27 NOTE — MAU Note (Addendum)
.  [name] [name] is a 33 y.o. at [redacted]w[redacted]d here in MAU reporting: contractions starting this morning all day every 3 minutes Denies VB, LOF and reports +FM    Onset of complaint: today  Pain score: 10/10 lower abd, 10/10 lower back  Vitals:   11/27/23 2215 11/27/23 2217  BP:  133/82  Pulse:  90  Temp: 98.4 F (36.9 C) 98.4 F (36.9 C)  SpO2:  100%     QYU:eloozi straight to room  Lab orders placed from triage: UA

## 2023-11-27 NOTE — L&D Delivery Note (Signed)
 OB/GYN Faculty Practice Delivery Note  [name] [name] is a 33 y.o. G2P1001 s/p NSVD at [redacted]w[redacted]d. She was admitted for eIOL.   ROM: 19h 33m with light meconium stained fluid GBS Status:  Negative/-- (12/18 1217) Maximum Maternal Temperature: 101.74F  Labor Progress: Initial SVE: 2/40/-3. Augmentation with AROM and Pitocin . Complicated by Chorioamnionitis (Maternal fever, fetal tachycardia) 2 hours prior to delivery, started on ampicillin  and gentamycin prior to delivery. She then progressed to complete.   Delivery Date/Time: 11/30/2023 @ 1415  Delivery: Called to room and patient was complete and pushing. Head delivered LOA. Loose nuchal cord present, reduced at perineum. Shoulder and body delivered in usual fashion. Infant with spontaneous cry, placed on mother's abdomen, dried and stimulated. Cord clamped x 2 after 30-second delay due to fetal bradycardia, and cut by myself. NICU called to the room. Baby taken to the warmer for suction and CPAP. Cord blood drawn. Placenta delivered spontaneously with gentle cord traction. Fundus firm with massage and Pitocin . Labia, perineum, vagina, and cervix inspected with minor hemostatic abrasions at 12, 3, 6 & 9 o'clock positions. Offered repair, patient declined.   Baby Weight: 3855g  Placenta: 3 vessel, intact. Sent to L&D Complications: None Lacerations: minor hemostatic abrasions at 12, 3, 6 & 9 o'clock positions EBL: 167 mL Analgesia: Epidural   Infant:  APGAR (1 MIN): 6  APGAR (5 MINS): 8   Mardy Shropshire, MD OB Family Medicine Fellow, [facility] for [facility], [facility] 11/30/2023, 2:40 PM

## 2023-11-27 NOTE — MAU Provider Note (Signed)
 [name] [name] is a 33 y.o. G2P1001 female at [redacted]w[redacted]d  RN Labor check, not seen by provider SVE by RN: Dilation: 2 Effacement (%): Thick Station: Ballotable Exam by:: CHRISTELLA Cart RN NST: FHR baseline 140s bpm, Variability: moderate, Accelerations:present, Decelerations:  Absent= Cat 1/Reactive Toco: irreg  D/C home  [name] [facility] 11/27/2023 10:48 PM

## 2023-11-29 ENCOUNTER — Encounter (HOSPITAL_COMMUNITY): Payer: Self-pay | Admitting: Obstetrics and Gynecology

## 2023-11-29 ENCOUNTER — Inpatient Hospital Stay (HOSPITAL_COMMUNITY)
Admission: RE | Admit: 2023-11-29 | Discharge: 2023-12-02 | DRG: 805 | Disposition: A | Discharge status: Home / Self Care | Payer: Medicaid Other | Attending: Obstetrics and Gynecology | Admitting: Obstetrics and Gynecology

## 2023-11-29 DIAGNOSIS — Z3A39 39 weeks gestation of pregnancy: Secondary | ICD-10-CM

## 2023-11-29 DIAGNOSIS — Z3009 Encounter for other general counseling and advice on contraception: Principal | ICD-10-CM

## 2023-11-29 DIAGNOSIS — Z91041 Radiographic dye allergy status: Secondary | ICD-10-CM | POA: Diagnosis not present

## 2023-11-29 DIAGNOSIS — O9902 Anemia complicating childbirth: Secondary | ICD-10-CM | POA: Diagnosis present

## 2023-11-29 DIAGNOSIS — O411230 Chorioamnionitis, third trimester, not applicable or unspecified: Secondary | ICD-10-CM | POA: Diagnosis present

## 2023-11-29 DIAGNOSIS — Z3490 Encounter for supervision of normal pregnancy, unspecified, unspecified trimester: Secondary | ICD-10-CM | POA: Diagnosis present

## 2023-11-29 DIAGNOSIS — Z148 Genetic carrier of other disease: Secondary | ICD-10-CM | POA: Diagnosis not present

## 2023-11-29 DIAGNOSIS — Z882 Allergy status to sulfonamides status: Secondary | ICD-10-CM | POA: Diagnosis not present

## 2023-11-29 DIAGNOSIS — Z8719 Personal history of other diseases of the digestive system: Secondary | ICD-10-CM

## 2023-11-29 DIAGNOSIS — Z8249 Family history of ischemic heart disease and other diseases of the circulatory system: Secondary | ICD-10-CM

## 2023-11-29 DIAGNOSIS — O26893 Other specified pregnancy related conditions, third trimester: Secondary | ICD-10-CM | POA: Diagnosis present

## 2023-11-29 DIAGNOSIS — O99214 Obesity complicating childbirth: Secondary | ICD-10-CM | POA: Diagnosis present

## 2023-11-29 DIAGNOSIS — Z87891 Personal history of nicotine dependence: Secondary | ICD-10-CM | POA: Diagnosis not present

## 2023-11-29 LAB — CBC
HCT: 30.3 % — ABNORMAL LOW (ref 36.0–46.0)
Hemoglobin: 9.4 g/dL — ABNORMAL LOW (ref 12.0–15.0)
MCH: 24.1 pg — ABNORMAL LOW (ref 26.0–34.0)
MCHC: 31 g/dL (ref 30.0–36.0)
MCV: 77.7 fL — ABNORMAL LOW (ref 80.0–100.0)
Platelets: 277 10*3/uL (ref 150–400)
RBC: 3.9 MIL/uL (ref 3.87–5.11)
RDW: 14.6 % (ref 11.5–15.5)
WBC: 13.1 10*3/uL — ABNORMAL HIGH (ref 4.0–10.5)
nRBC: 0 % (ref 0.0–0.2)

## 2023-11-29 LAB — TYPE AND SCREEN
ABO/RH(D): O POS
Antibody Screen: NEGATIVE

## 2023-11-29 MED ORDER — FENTANYL CITRATE (PF) 100 MCG/2ML IJ SOLN
50.0000 ug | INTRAMUSCULAR | Status: DC | PRN
  Administered 2023-11-30: 50 ug via INTRAVENOUS
  Administered 2023-11-30: 100 ug via INTRAVENOUS
  Filled 2023-11-29 (×2): qty 2

## 2023-11-29 MED ORDER — LACTATED RINGERS IV SOLN
INTRAVENOUS | Status: DC
Start: 2023-11-29 — End: 2023-11-30

## 2023-11-29 MED ORDER — OXYTOCIN-SODIUM CHLORIDE 30-0.9 UT/500ML-% IV SOLN
2.5000 [IU]/h | INTRAVENOUS | Status: DC
Start: 2023-11-29 — End: 2023-11-30
  Filled 2023-11-29: qty 500

## 2023-11-29 MED ORDER — OXYCODONE-ACETAMINOPHEN 5-325 MG PO TABS: 2.0000 | ORAL_TABLET | ORAL | Status: DC | PRN

## 2023-11-29 MED ORDER — LIDOCAINE HCL (PF) 1 % IJ SOLN: 30.0000 mL | INTRAMUSCULAR | Status: DC | PRN

## 2023-11-29 MED ORDER — OXYCODONE-ACETAMINOPHEN 5-325 MG PO TABS: 1.0000 | ORAL_TABLET | ORAL | Status: DC | PRN

## 2023-11-29 MED ORDER — CYCLOBENZAPRINE HCL 5 MG PO TABS
10.0000 mg | ORAL_TABLET | Freq: Two times a day (BID) | ORAL | Status: DC
  Administered 2023-11-29: 10 mg via ORAL
  Filled 2023-11-29: qty 2

## 2023-11-29 MED ORDER — ONDANSETRON HCL 4 MG/2ML IJ SOLN
4.0000 mg | Freq: Four times a day (QID) | INTRAMUSCULAR | Status: DC | PRN
Start: 2023-11-29 — End: 2023-11-30
  Administered 2023-11-30: 4 mg via INTRAVENOUS
  Filled 2023-11-29: qty 2

## 2023-11-29 MED ORDER — LACTATED RINGERS IV SOLN
500.0000 mL | INTRAVENOUS | Status: DC | PRN
  Administered 2023-11-30: 250 mL via INTRAVENOUS
  Administered 2023-11-30 (×2): 500 mL via INTRAVENOUS

## 2023-11-29 MED ORDER — ACETAMINOPHEN 325 MG PO TABS
650.0000 mg | ORAL_TABLET | ORAL | Status: DC | PRN
  Administered 2023-11-30: 650 mg via ORAL
  Filled 2023-11-29: qty 2

## 2023-11-29 MED ORDER — OXYTOCIN BOLUS FROM INFUSION: 333.0000 mL | Freq: Once | INTRAVENOUS | Status: DC

## 2023-11-29 MED ORDER — SOD CITRATE-CITRIC ACID 500-334 MG/5ML PO SOLN: 30.0000 mL | ORAL | Status: DC | PRN

## 2023-11-29 MED ORDER — TRANEXAMIC ACID-NACL 1000-0.7 MG/100ML-% IV SOLN
1000.0000 mg | Freq: Once | INTRAVENOUS | Status: AC
  Administered 2023-11-30: 1000 mg via INTRAVENOUS
  Filled 2023-11-29: qty 100

## 2023-11-29 NOTE — Progress Notes (Signed)
 LABOR PROGRESS NOTE  Patient Name: [name] [name], female   DOB: 1991/06/01, 33 y.o.  MRN: [id number]  G2P1001 at [redacted]w[redacted]d admitted for eIOL  S: Feeling mostly back pain that's gotten worse throughout her pregnancy. Starting to feel contractions mildly.  O:  BP 121/75   Pulse 85   Temp 98 F (36.7 C) (Oral)   Resp 16   LMP 12/07/2022  EFM:130 bpm/Moderate variability/ 15x15 accels/ None decels CAT: 1 Toco: regular, every 2-4 minutes   CVE: Dilation: 2 Effacement (%): 40 Station: -3 Presentation: Vertex Exam by:: Dr. Von   A&P:   #Labor: S/p AROM and continuing to contract regularly. Pit if needed #Pain: Per pt request. Heating pad #FWB: CAT 1 #GBS negative #Anticipate vaginal delivery  [name], MD FMOB Fellow, Faculty practice [facility], Center for [facility] 11/29/23  9:13 PM

## 2023-11-29 NOTE — H&P (Signed)
 OBSTETRIC ADMISSION HISTORY AND PHYSICAL  [name] [name] is a 33 y.o. female G2P1001 with IUP at [redacted]w[redacted]d (dated by 12 wk US , Estimated Date of Delivery: 12/04/23) presenting for elective IOL.   She reports +FMs, No LOF, no VB, no blurry vision, headaches or peripheral edema, and RUQ pain.    She plans on pumping breast milk and formula feeding. She request PP BTL for birth control.  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   Sprain of MCL (medial collateral ligament) of knee 10/12/2021    Past Surgical History: Past Surgical History:  Procedure Laterality Date   BIOPSY  05/27/2018   Procedure: BIOPSY;  Surgeon: Lennard Lesta FALCON, MD;  Location: [facility] ENDOSCOPY;  Service: Endoscopy;;   ESOPHAGOGASTRODUODENOSCOPY (EGD) WITH PROPOFOL  N/A 09/11/2017   Procedure: ESOPHAGOGASTRODUODENOSCOPY (EGD) WITH PROPOFOL ;  Surgeon: Dianna Specking, MD;  Location: [facility] ENDOSCOPY;  Service: Endoscopy;  Laterality: N/A;   ESOPHAGOGASTRODUODENOSCOPY (EGD) WITH PROPOFOL  N/A 02/20/2018   Procedure: ESOPHAGOGASTRODUODENOSCOPY (EGD) WITH PROPOFOL ;  Surgeon: Teressa Toribio SQUIBB, MD;  Location: [facility] ENDOSCOPY;  Service: Endoscopy;  Laterality: N/A;   ESOPHAGOGASTRODUODENOSCOPY (EGD) WITH PROPOFOL  N/A 05/27/2018   Procedure: ESOPHAGOGASTRODUODENOSCOPY (EGD) WITH PROPOFOL  WITH CLIP PLACEMENT;  Surgeon: Lennard Lesta FALCON, MD;  Location: [facility] ENDOSCOPY;  Service: Endoscopy;  Laterality: N/A;   INSERTION OF MESH N/A 12/26/2016   Procedure: INSERTION OF MESH;  Surgeon: Vernetta Berg, MD;  Location: [facility] ORS;  Service: General;  Laterality: N/A;    KNEE ARTHROSCOPY WITH MEDIAL PATELLAR FEMORAL LIGAMENT RECONSTRUCTION Left 03/09/2022   Procedure: LEFT KNEE ARTHROSCOPY WITH MEDIAL PATELLAR FEMORAL LIGAMENT RECONSTRUCTION WILL ALLOGRAFT;  Surgeon: Sharl Selinda Dover, MD;  Location: [facility] ORS;  Service: Orthopedics;  Laterality: Left;   LAPAROSCOPY N/A 12/26/2016   Procedure: LAPAROSCOPY DIAGNOSTIC;  Surgeon: Vernetta Berg, MD;  Location: [facility] ORS;  Service: General;  Laterality: N/A;   UMBILICAL HERNIA REPAIR N/A 12/26/2016   Procedure: HERNIA REPAIR UMBILICAL ADULT;  Surgeon: Vernetta Berg, MD;  Location: [facility] ORS;  Service: General;  Laterality: N/A;    Obstetrical History: OB History     Gravida  2   Para  1   Term  1   Preterm      AB      Living  1      SAB      IAB      Ectopic      Multiple      Live Births  1           Social History Social History   Socioeconomic History   Marital status: Single    Spouse name: Not on file   Number of children: 1   Years of education: Not on file   Highest education level: Not on file  Occupational History   Not on file  Tobacco Use   Smoking status: Former    Current packs/day: 0.00    Average packs/day: 0.3 packs/day for 4.0 years (1.0 ttl pk-yrs)    Types: Cigarettes    Start date: 2015    Quit date: 2019    Years since quitting: 6.0  Smokeless tobacco: Never  Vaping Use   Vaping status: Never Used  Substance and Sexual Activity   Alcohol use: Not Currently    Comment: rarely   Drug use: No   Sexual activity: Never    Birth control/protection: None  Other Topics Concern   Not on file  Social History Narrative   Right handed   Two story home    Drinks caffeine    Social Drivers of Health   Financial Resource Strain: Not on file  Food Insecurity: No Food Insecurity (11/29/2023)   Hunger Vital Sign    Worried About Running Out of Food in the Last Year: Never true    Ran Out of Food in the Last Year: Never true  Transportation Needs: No  Transportation Needs (11/29/2023)   PRAPARE - Administrator, Civil Service (Medical): No    Lack of Transportation (Non-Medical): No  Physical Activity: Not on file  Stress: Not on file  Social Connections: Unknown (11/29/2023)   Social Connection and Isolation Panel [NHANES]    Frequency of Communication with Friends and Family: Not on file    Frequency of Social Gatherings with Friends and Family: Not on file    Attends Religious Services: Never    Database Administrator or Organizations: No    Attends Engineer, Structural: Not on file    Marital Status: Not on file    Family History: Family History  Problem Relation Age of Onset   Hypertension Mother    Stroke Mother    CAD Mother 10       MI on 10/18   Colon cancer Neg Hx    Stomach cancer Neg Hx    Rectal cancer Neg Hx     Allergies: Allergies  Allergen Reactions   Iohexol Hives, Shortness Of Breath, Itching, Nausea And Vomiting, Swelling and Other (See Comments)    Also caused chest pain PER DR BARRY, PRE MED PROTOCOL SHOULD BE ADMINISTERED PRIOR TO SCANS IN THE FUTURE.  PT HAD A SIGNIFICANT REACTION.  [facility]  08/18/17 Patient had a reaction of SOB, swelling even though premedicated. Physician states no further contrast imaging in the future.   Peanut-Containing Drug Products Anaphylaxis and Swelling    Facial swelling   Other Swelling    Tree nuts cause facial swelling   Sulfa Antibiotics Swelling    Facial swelling (reported by [facility] 2013)   Iodinated Contrast Media Rash    Medications Prior to Admission  Medication Sig Dispense Refill Last Dose/Taking   acetaminophen  (TYLENOL ) 500 MG tablet Take 500 mg by mouth every 6 (six) hours as needed.   11/29/2023   cyclobenzaprine  (FLEXERIL ) 10 MG tablet Take 1 tablet (10 mg total) by mouth 2 (two) times daily as needed. 20 tablet 1 11/29/2023   ferrous sulfate  325 (65 FE) MG EC tablet Take 1 tablet (325 mg total) by mouth every other day. 30 tablet 2  11/29/2023   Prenatal Vit-Fe Phos-FA-Omega (VITAFOL  GUMMIES) 3.33-0.333-34.8 MG CHEW Chew 1 tablet by mouth daily. 90 tablet 5 11/29/2023   albuterol  (VENTOLIN  HFA) 108 (90 Base) MCG/ACT inhaler Inhale 2 puffs into the lungs every 6 (six) hours as needed for wheezing or shortness of breath. 1 each 6    calcium  carbonate (TUMS - DOSED IN MG ELEMENTAL CALCIUM ) 500 MG chewable tablet Chew 1 tablet by mouth daily.      oxyCODONE -acetaminophen  (PERCOCET/ROXICET) 5-325 MG tablet Take 1 tablet by mouth every 6 (six) hours as needed  for severe pain (pain score 7-10). 6 tablet 0    promethazine  (PHENERGAN ) 25 MG tablet Take 1 tablet (25 mg total) by mouth every 6 (six) hours as needed for nausea or vomiting. 30 tablet 2      Review of Systems  All systems reviewed and negative except as stated in HPI.  Blood pressure 128/78, pulse 90, temperature 98 F (36.7 C), temperature source Oral, resp. rate 16, last menstrual period 12/07/2022. General appearance: alert and cooperative Lungs: breathing comfortably on room air Heart: regular rate Abdomen: soft, non-tender; gravid Extremities: no edema of bilateral lower extremities Presentation: cephalic Fetal monitoring: 140/mod/+a/-d Uterine activity: UI Dilation: 2 Effacement (%): 40 Station: -3 Exam by:: Dr. Von   Prenatal labs: ABO, Rh: --/--/O POS (01/03 1634) Antibody: NEG (01/03 1634) Rubella: 1.12 (07/19 1221) RPR: Non Reactive (10/18 0852)  HBsAg: Negative (07/19 1221)  HIV: Non Reactive (10/18 0852)  GBS: Negative/-- (12/18 1217)  2 hr Glucola wnl Genetic screening  LR Anatomy US  wnl Last US : At [redacted]w[redacted]d - cephalic presentation, EFW 1896g (74 %tile), AC 97%tile  Prenatal Transfer Tool  Maternal Diabetes: No Genetic Screening: Normal Maternal Ultrasounds/Referrals: Normal Fetal Ultrasounds or other Referrals:  None Maternal Substance Abuse:  No Significant Maternal Medications:  None Significant Maternal Lab Results:  Group B Strep  negative Number of Prenatal Visits:greater than 3 verified prenatal visits Other Comments:  None  Results for orders placed or performed during the hospital encounter of 11/29/23 (from the past 24 hours)  CBC   Collection Time: 11/29/23  4:34 PM  Result Value Ref Range   WBC 13.1 (H) 4.0 - 10.5 K/uL   RBC 3.90 3.87 - 5.11 MIL/uL   Hemoglobin 9.4 (L) 12.0 - 15.0 g/dL   HCT 69.6 (L) 63.9 - 53.9 %   MCV 77.7 (L) 80.0 - 100.0 fL   MCH 24.1 (L) 26.0 - 34.0 pg   MCHC 31.0 30.0 - 36.0 g/dL   RDW 85.3 88.4 - 84.4 %   Platelets 277 150 - 400 K/uL   nRBC 0.0 0.0 - 0.2 %  Type and screen [facility]   Collection Time: 11/29/23  4:34 PM  Result Value Ref Range   ABO/RH(D) O POS    Antibody Screen NEG    Sample Expiration      12/02/2023,2359 Performed at [facility], [address], [place], [place] [address]     Patient Active Problem List   Diagnosis Date Noted   Encounter for induction of labor 11/29/2023   Anemia complicating pregnancy, third trimester 09/18/2023   Unwanted fertility 09/13/2023   Nausea and vomiting during pregnancy 07/30/2023   Obesity affecting pregnancy, antepartum 07/05/2023   Alpha thalassemia silent carrier 07/05/2023   Supervision of low-risk pregnancy 06/14/2023    Assessment/Plan:  [name] [name] is a 33 y.o. G2P1001 at [redacted]w[redacted]d here for elective IOL  #Labor: Discussed overview of IOL w pt in detail  given multiparous status, introduced role of AROM to pt -- pt verbally consented and AROM was performed with moderate amount of clear fluid #Pain: Per pt request #FWB: Cat I #ID:  GBS neg #MOF: Both #MOC:BTL (tubal papers signed) #Circ:  N/A  #BMI 38  #Anemia: HgB 9.4 on admit  TXA at delivery  Alain Von, MD OB Fellow, Faculty Practice [facility], Center for [facility] 11/29/23 7:14 PM

## 2023-11-30 ENCOUNTER — Encounter (HOSPITAL_COMMUNITY): Payer: Self-pay | Admitting: Obstetrics and Gynecology

## 2023-11-30 ENCOUNTER — Inpatient Hospital Stay (HOSPITAL_COMMUNITY): Payer: Medicaid Other | Admitting: Anesthesiology

## 2023-11-30 DIAGNOSIS — O411230 Chorioamnionitis, third trimester, not applicable or unspecified: Secondary | ICD-10-CM

## 2023-11-30 DIAGNOSIS — Z3A39 39 weeks gestation of pregnancy: Secondary | ICD-10-CM

## 2023-11-30 LAB — RPR: RPR Ser Ql: NONREACTIVE

## 2023-11-30 MED ORDER — SODIUM CHLORIDE 0.9 % IV SOLN
2.0000 g | Freq: Four times a day (QID) | INTRAVENOUS | Status: DC
  Administered 2023-11-30: 2 g via INTRAVENOUS
  Filled 2023-11-30: qty 2000

## 2023-11-30 MED ORDER — DIPHENHYDRAMINE HCL 25 MG PO CAPS: 25.0000 mg | ORAL_CAPSULE | Freq: Four times a day (QID) | ORAL | Status: DC | PRN

## 2023-11-30 MED ORDER — ACETAMINOPHEN 325 MG PO TABS
650.0000 mg | ORAL_TABLET | ORAL | Status: DC | PRN
  Administered 2023-12-01 – 2023-12-02 (×4): 650 mg via ORAL
  Filled 2023-11-30 (×4): qty 2

## 2023-11-30 MED ORDER — OXYTOCIN-SODIUM CHLORIDE 30-0.9 UT/500ML-% IV SOLN: 2.5000 [IU]/h | INTRAVENOUS | Status: DC

## 2023-11-30 MED ORDER — EPHEDRINE 5 MG/ML INJ: 10.0000 mg | INTRAVENOUS | Status: DC | PRN

## 2023-11-30 MED ORDER — IBUPROFEN 600 MG PO TABS
600.0000 mg | ORAL_TABLET | Freq: Four times a day (QID) | ORAL | Status: DC
  Administered 2023-11-30 – 2023-12-02 (×7): 600 mg via ORAL
  Filled 2023-11-30 (×7): qty 1

## 2023-11-30 MED ORDER — CYCLOBENZAPRINE HCL 5 MG PO TABS: 10.0000 mg | ORAL_TABLET | Freq: Two times a day (BID) | ORAL | Status: DC | PRN

## 2023-11-30 MED ORDER — DIBUCAINE (PERIANAL) 1 % EX OINT
1.0000 | TOPICAL_OINTMENT | CUTANEOUS | Status: DC | PRN
  Administered 2023-11-30: 1 via RECTAL
  Filled 2023-11-30: qty 28

## 2023-11-30 MED ORDER — ONDANSETRON HCL 4 MG PO TABS: 4.0000 mg | ORAL_TABLET | ORAL | Status: DC | PRN

## 2023-11-30 MED ORDER — FERROUS SULFATE 325 (65 FE) MG PO TABS: 325.0000 mg | ORAL_TABLET | ORAL | Status: DC

## 2023-11-30 MED ORDER — COCONUT OIL OIL: 1.0000 | TOPICAL_OIL | Status: DC | PRN

## 2023-11-30 MED ORDER — SODIUM CHLORIDE 0.9% FLUSH: 3.0000 mL | Freq: Two times a day (BID) | INTRAVENOUS | Status: DC

## 2023-11-30 MED ORDER — FENTANYL-BUPIVACAINE-NACL 0.5-0.125-0.9 MG/250ML-% EP SOLN
12.0000 mL/h | EPIDURAL | Status: DC | PRN
  Filled 2023-11-30: qty 250

## 2023-11-30 MED ORDER — TERBUTALINE SULFATE 1 MG/ML IJ SOLN: 0.2500 mg | Freq: Once | INTRAMUSCULAR | Status: DC | PRN

## 2023-11-30 MED ORDER — GENTAMICIN SULFATE 40 MG/ML IJ SOLN
5.0000 mg/kg | INTRAVENOUS | Status: DC
  Administered 2023-11-30: 340 mg via INTRAVENOUS
  Filled 2023-11-30: qty 8.5

## 2023-11-30 MED ORDER — TETANUS-DIPHTH-ACELL PERTUSSIS 5-2.5-18.5 LF-MCG/0.5 IM SUSY: 0.5000 mL | PREFILLED_SYRINGE | Freq: Once | INTRAMUSCULAR | Status: DC

## 2023-11-30 MED ORDER — SENNOSIDES-DOCUSATE SODIUM 8.6-50 MG PO TABS
2.0000 | ORAL_TABLET | ORAL | Status: DC
  Administered 2023-12-02: 2 via ORAL
  Filled 2023-11-30 (×2): qty 2

## 2023-11-30 MED ORDER — OXYTOCIN-SODIUM CHLORIDE 30-0.9 UT/500ML-% IV SOLN
1.0000 m[IU]/min | INTRAVENOUS | Status: DC
  Administered 2023-11-30: 2 m[IU]/min via INTRAVENOUS

## 2023-11-30 MED ORDER — SODIUM CHLORIDE 0.9% FLUSH: 3.0000 mL | INTRAVENOUS | Status: DC | PRN

## 2023-11-30 MED ORDER — FENTANYL-BUPIVACAINE-NACL 0.5-0.125-0.9 MG/250ML-% EP SOLN
EPIDURAL | Status: DC | PRN
  Administered 2023-11-30: 12 mL/h via EPIDURAL

## 2023-11-30 MED ORDER — ZOLPIDEM TARTRATE 5 MG PO TABS: 5.0000 mg | ORAL_TABLET | Freq: Every evening | ORAL | Status: DC | PRN

## 2023-11-30 MED ORDER — DIPHENHYDRAMINE HCL 50 MG/ML IJ SOLN: 12.5000 mg | INTRAMUSCULAR | Status: DC | PRN

## 2023-11-30 MED ORDER — LACTATED RINGERS IV SOLN
500.0000 mL | Freq: Once | INTRAVENOUS | Status: AC
  Administered 2023-11-30: 500 mL via INTRAVENOUS

## 2023-11-30 MED ORDER — OXYTOCIN BOLUS FROM INFUSION
333.0000 mL | Freq: Once | INTRAVENOUS | Status: AC
  Administered 2023-11-30: 600 mL via INTRAVENOUS

## 2023-11-30 MED ORDER — FAMOTIDINE 20 MG PO TABS: 40.0000 mg | ORAL_TABLET | Freq: Once | ORAL | Status: DC

## 2023-11-30 MED ORDER — PRENATAL MULTIVITAMIN CH
1.0000 | ORAL_TABLET | Freq: Every day | ORAL | Status: DC
Start: 2023-12-01 — End: 2023-12-02
  Administered 2023-12-01 – 2023-12-02 (×2): 1 via ORAL
  Filled 2023-11-30 (×2): qty 1

## 2023-11-30 MED ORDER — MEASLES, MUMPS & RUBELLA VAC IJ SOLR
0.5000 mL | Freq: Once | INTRAMUSCULAR | Status: DC
Start: 2023-12-01 — End: 2023-12-02

## 2023-11-30 MED ORDER — ONDANSETRON HCL 4 MG/2ML IJ SOLN: 4.0000 mg | INTRAMUSCULAR | Status: DC | PRN

## 2023-11-30 MED ORDER — PHENYLEPHRINE 80 MCG/ML (10ML) SYRINGE FOR IV PUSH (FOR BLOOD PRESSURE SUPPORT)
80.0000 ug | PREFILLED_SYRINGE | INTRAVENOUS | Status: DC | PRN
  Filled 2023-11-30: qty 10

## 2023-11-30 MED ORDER — SODIUM CHLORIDE 0.9 % IV SOLN: 250.0000 mL | INTRAVENOUS | Status: DC | PRN

## 2023-11-30 MED ORDER — WITCH HAZEL-GLYCERIN EX PADS: 1.0000 | MEDICATED_PAD | CUTANEOUS | Status: DC | PRN

## 2023-11-30 MED ORDER — METOCLOPRAMIDE HCL 10 MG PO TABS: 10.0000 mg | ORAL_TABLET | Freq: Once | ORAL | Status: DC

## 2023-11-30 MED ORDER — ACETAMINOPHEN 10 MG/ML IV SOLN: 1000.0000 mg | Freq: Four times a day (QID) | INTRAVENOUS | Status: DC

## 2023-11-30 MED ORDER — PHENYLEPHRINE 80 MCG/ML (10ML) SYRINGE FOR IV PUSH (FOR BLOOD PRESSURE SUPPORT)
80.0000 ug | PREFILLED_SYRINGE | INTRAVENOUS | Status: DC | PRN
  Administered 2023-11-30 (×2): 80 ug via INTRAVENOUS

## 2023-11-30 MED ORDER — BENZOCAINE-MENTHOL 20-0.5 % EX AERO
1.0000 | INHALATION_SPRAY | CUTANEOUS | Status: DC | PRN
  Administered 2023-11-30: 1 via TOPICAL
  Filled 2023-11-30 (×2): qty 56

## 2023-11-30 MED ORDER — LIDOCAINE HCL (PF) 1 % IJ SOLN
INTRAMUSCULAR | Status: DC | PRN
  Administered 2023-11-30: 5 mL via EPIDURAL

## 2023-11-30 MED ORDER — ACETAMINOPHEN 500 MG PO TABS
1000.0000 mg | ORAL_TABLET | Freq: Four times a day (QID) | ORAL | Status: DC | PRN
  Administered 2023-11-30: 1000 mg via ORAL
  Filled 2023-11-30: qty 2

## 2023-11-30 MED ORDER — SIMETHICONE 80 MG PO CHEW: 80.0000 mg | CHEWABLE_TABLET | ORAL | Status: DC | PRN

## 2023-11-30 NOTE — Anesthesia Preprocedure Evaluation (Signed)
 Anesthesia Evaluation  Patient identified by MRN, date of birth, ID band Patient awake    Reviewed: Allergy & Precautions, NPO status , Patient's Chart, lab work & pertinent test results  Airway Mallampati: II  TM Distance: >3 FB Neck ROM: Full    Dental no notable dental hx. (+) Teeth Intact, Dental Advisory Given   Pulmonary asthma , former smoker   Pulmonary exam normal breath sounds clear to auscultation       Cardiovascular negative cardio ROS Normal cardiovascular exam Rhythm:Regular Rate:Normal     Neuro/Psych  Headaches    GI/Hepatic negative GI ROS, Neg liver ROS,,,  Endo/Other    Renal/GU      Musculoskeletal   Abdominal  (+) + obese (BMI 38.6)  Peds  Hematology  (+) Blood dyscrasia, anemia Lab Results      Component                Value               Date                      WBC                      13.1 (H)            11/29/2023                HGB                      9.4 (L)             11/29/2023                HCT                      30.3 (L)            11/29/2023                MCV                      77.7 (L)            11/29/2023                PLT                      277                 11/29/2023              Anesthesia Other Findings All: Sulfa, iohexol  Reproductive/Obstetrics (+) Pregnancy                              Anesthesia Physical Anesthesia Plan  ASA: 3  Anesthesia Plan: Epidural   Post-op Pain Management:    Induction:   PONV Risk Score and Plan:   Airway Management Planned:   Additional Equipment:   Intra-op Plan:   Post-operative Plan:   Informed Consent: I have reviewed the patients History and Physical, chart, labs and discussed the procedure including the risks, benefits and alternatives for the proposed anesthesia with the patient or authorized representative who has indicated his/her understanding and acceptance.       Plan  Discussed with:   Anesthesia Plan Comments: (39.3 wk G2P1 w hgb 9.4  and BMI 38.6 for LEA)         Anesthesia Quick Evaluation

## 2023-11-30 NOTE — Progress Notes (Signed)
NOT REMOVED

## 2023-11-30 NOTE — Progress Notes (Signed)
 ANTIBIOTIC CONSULT NOTE - INITIAL  Pharmacy Consult for Gentamicin  Indication: Chorioamnionitis   Allergies  Allergen Reactions   Iohexol Hives, Shortness Of Breath, Itching, Nausea And Vomiting, Swelling and Other (See Comments)    Also caused chest pain PER DR BARRY, PRE MED PROTOCOL SHOULD BE ADMINISTERED PRIOR TO SCANS IN THE FUTURE.  PT HAD A SIGNIFICANT REACTION.  [facility]  08/18/17 Patient had a reaction of SOB, swelling even though premedicated. Physician states no further contrast imaging in the future.   Peanut-Containing Drug Products Anaphylaxis and Swelling    Facial swelling   Other Swelling    Tree nuts cause facial swelling   Sulfa Antibiotics Swelling    Facial swelling (reported by [facility] 2013)   Iodinated Contrast Media Rash    Patient Measurements: Total Body weight: 95.6 kg Adjusted Body Weight: 68.3 kg  Vital Signs: Temp: 101.2 F (38.4 C) (01/04 1249) Temp Source: Axillary (01/04 1249) BP: 102/43 (01/04 1249) Pulse Rate: 103 (01/04 1249)  Labs: Recent Labs    11/29/23 1634  WBC 13.1*  HGB 9.4*  PLT 277      Microbiology: Recent Results (from the past 720 hours)  Strep Gp B NAA     Status: None   Collection Time: 11/13/23 12:17 PM   Specimen: Vaginal Swab   VR  Result Value Ref Range Status   Strep Gp B NAA Negative Negative Final    Comment: Centers for Disease Control and Prevention (CDC) and American Congress of Obstetricians and Gynecologists (ACOG) guidelines for prevention of perinatal group B streptococcal (GBS) disease specify co-collection of a vaginal and rectal swab specimen to maximize sensitivity of GBS detection. Per the CDC and ACOG, swabbing both the lower vagina and rectum substantially increases the yield of detection compared with sampling the vagina alone. Penicillin  G, ampicillin , or cefazolin  are indicated for intrapartum prophylaxis of perinatal GBS colonization. Reflex susceptibility testing should be performed  prior to use of clindamycin only on GBS isolates from penicillin -allergic women who are considered a high risk for anaphylaxis. Treatment with vancomycin without additional testing is warranted if resistance to clindamycin is noted.     Medications:  Ampicillin  2 grams IV Q6H  Assessment: 33 y.o. female G2P1001 at [redacted]w[redacted]d admitted for IOL.  Now with fever during labor.  Plan:  Gentamicin  5mg /kg IV every 24 hrs  Check Scr with next labs if gentamicin  continued. Will check gentamicin  levels if continued > 72hr or clinically indicated.  Neill Clarity 11/30/2023,1:00 PM

## 2023-11-30 NOTE — Anesthesia Procedure Notes (Signed)
 Epidural Patient location during procedure: OB Start time: 11/30/2023 6:00 AM End time: 11/30/2023 6:17 AM  Staffing Anesthesiologist: Jefm Garnette LABOR, MD Performed: anesthesiologist   Preanesthetic Checklist Completed: patient identified, IV checked, site marked, risks and benefits discussed, surgical consent, monitors and equipment checked, pre-op evaluation and timeout performed  Epidural Patient position: sitting Prep: DuraPrep and site prepped and draped Patient monitoring: continuous pulse ox and blood pressure Approach: midline Location: L3-L4 Injection technique: LOR air  Needle:  Needle type: Tuohy  Needle gauge: 17 G Needle length: 9 cm and 9 Needle insertion depth: 7 cm Catheter type: closed end flexible Catheter size: 19 Gauge Catheter at skin depth: 12 cm Test dose: negative  Assessment Events: blood not aspirated, no cerebrospinal fluid, injection not painful, no injection resistance, no paresthesia and negative IV test  Additional Notes Patient identified. Risks/Benefits/Options discussed with patient including but not limited to bleeding, infection, nerve damage, paralysis, failed block, incomplete pain control, headache, blood pressure changes, nausea, vomiting, reactions to medication both or allergic, itching and postpartum back pain. Confirmed with bedside nurse the patient's most recent platelet count. Confirmed with patient that they are not currently taking any anticoagulation, have any bleeding history or any family history of bleeding disorders. Patient expressed understanding and wished to proceed. All questions were answered. Sterile technique was used throughout the entire procedure. Please see nursing notes for vital signs. Test dose was given through epidural needle and negative prior to continuing to dose epidural or start infusion. Warning signs of high block given to the patient including shortness of breath, tingling/numbness in hands, complete motor  block, or any concerning symptoms with instructions to call for help. Patient was given instructions on fall risk and not to get out of bed. All questions and concerns addressed with instructions to call with any issues.  1 Attempt (S) . Patient tolerated procedure well.

## 2023-11-30 NOTE — Discharge Summary (Addendum)
 Postpartum Discharge Summary     Patient Name: [name] [name] DOB: 1991/11/05 MRN: [id number]  Date of admission: 11/29/2023 Delivery date:11/30/2023 Delivering provider: LEVEQUE, ALYSSA Date of discharge: 12/02/2023  Admitting diagnosis: Encounter for induction of labor [Z34.90] Intrauterine pregnancy: [redacted]w[redacted]d     Secondary diagnosis:  Active Problems:   NSVD (normal spontaneous vaginal delivery)   H/O umbilical hernia repair with mesh  Additional problems: Anemia, undesired fertility, history of umbilical hernia repair with mesh Discharge diagnosis: Term Pregnancy Delivered                                              Post partum procedures: none Augmentation: AROM and Pitocin  Complications: Intrauterine Inflammation or infection (Chorioamniotis)  Hospital course: Induction of Labor With Vaginal Delivery   33 y.o. yo [id number] at [redacted]w[redacted]d was admitted to the hospital 11/29/2023 for induction of labor.  Indication for induction: Elective.  Patient had an labor course complicated by chorioamnionitis for which she received ampicillin  and gentamicin .  Membrane Rupture Time/Date: 6:54 PM,11/29/2023  Delivery Method:Vaginal, Spontaneous Operative Delivery:N/A Episiotomy: None Lacerations:  None Details of delivery can be found in separate delivery note.  Patient had a postpartum course complicated by none. Patient is discharged home 12/02/23.  Newborn Data: Birth date:11/30/2023 Birth time:2:15 PM Gender:Female Living status:Living Apgars:6 ,8  Weight:3855 g  Magnesium  Sulfate received: No BMZ received: No Rhophylac:N/A MMR:N/A T-DaP:Given prenatally Flu: No RSV Vaccine received: No Transfusion:No  Immunizations received: Immunization History  Administered Date(s) Administered   Influenza, Quadrivalent, Recombinant, Inj, Pf 08/06/2019   Tdap 08/27/2023    Physical exam  Vitals:   12/01/23 1600 12/01/23 2115 12/02/23 0525 12/02/23 0654  BP: 121/74 118/77 110/64   Pulse: 95 98 75    Resp:  19 16   Temp: 98.4 F (36.9 C) 98.6 F (37 C)    TempSrc:  Oral    SpO2: 98% 99% 98%   Weight:    95.6 kg  Height:    5' 2 (1.575 m)   General: alert, cooperative, and no distress Lochia: appropriate Uterine Fundus: firm Incision: N/A DVT Evaluation: No evidence of DVT seen on physical exam. No significant calf/ankle edema. Labs: Lab Results  Component Value Date   WBC 18.8 (H) 12/01/2023   HGB 8.7 (L) 12/01/2023   HCT 27.4 (L) 12/01/2023   MCV 77.4 (L) 12/01/2023   PLT 219 12/01/2023      Latest Ref Rng & Units 05/29/2023    5:35 PM  CMP  Glucose 70 - 99 mg/dL 96   BUN 6 - 20 mg/dL <5   Creatinine 9.55 - 1.00 mg/dL 9.35   Sodium 864 - 854 mmol/L 131   Potassium 3.5 - 5.1 mmol/L 3.5   Chloride 98 - 111 mmol/L 101   CO2 22 - 32 mmol/L 22   Calcium  8.9 - 10.3 mg/dL 9.0   Total Protein 6.5 - 8.1 g/dL 6.4   Total Bilirubin 0.3 - 1.2 mg/dL 0.9   Alkaline Phos 38 - 126 U/L 55   AST 15 - 41 U/L 35   ALT 0 - 44 U/L 38    Edinburgh Score:    12/01/2023    5:36 AM  Edinburgh Postnatal Depression Scale Screening Tool  I have been able to laugh and see the funny side of things. 2  I have looked forward with enjoyment to things. 1  I have blamed myself unnecessarily when things went wrong. 2  I have been anxious or worried for no good reason. 2  I have felt scared or panicky for no good reason. 2  Things have been getting on top of me. 2  I have been so unhappy that I have had difficulty sleeping. 2  I have felt sad or miserable. 1  I have been so unhappy that I have been crying. 1  The thought of harming myself has occurred to me. 0  Edinburgh Postnatal Depression Scale Total 15   Edinburgh Postnatal Depression Scale Total: (!) 15   After visit meds:  Allergies as of 12/02/2023       Reactions   Iohexol Hives, Shortness Of Breath, Itching, Nausea And Vomiting, Swelling, Other (See Comments)   Also caused chest pain PER DR BARRY, PRE MED PROTOCOL SHOULD BE  ADMINISTERED PRIOR TO SCANS IN THE FUTURE.  PT HAD A SIGNIFICANT REACTION.  [facility] 08/18/17 Patient had a reaction of SOB, swelling even though premedicated. Physician states no further contrast imaging in the future.   Peanut-containing Drug Products Anaphylaxis, Swelling   Facial swelling   Other Swelling   Tree nuts cause facial swelling   Sulfa Antibiotics Swelling   Facial swelling (reported by [facility] 2013)   Iodinated Contrast Media Rash        Medication List     STOP taking these medications    promethazine  25 MG tablet Commonly known as: PHENERGAN        TAKE these medications    acetaminophen  500 MG tablet Commonly known as: TYLENOL  Take 500 mg by mouth every 6 (six) hours as needed. What changed: Another medication with the same name was added. Make sure you understand how and when to take each.   acetaminophen  325 MG tablet Commonly known as: Tylenol  Take 2 tablets (650 mg total) by mouth every 4 (four) hours as needed (for pain scale < 4). What changed: You were already taking a medication with the same name, and this prescription was added. Make sure you understand how and when to take each.   albuterol  108 (90 Base) MCG/ACT inhaler Commonly known as: VENTOLIN  HFA Inhale 2 puffs into the lungs every 6 (six) hours as needed for wheezing or shortness of breath.   calcium  carbonate 500 MG chewable tablet Commonly known as: TUMS - dosed in mg elemental calcium  Chew 1 tablet by mouth daily.   cyclobenzaprine  10 MG tablet Commonly known as: FLEXERIL  Take 1 tablet (10 mg total) by mouth 2 (two) times daily as needed.   ferrous sulfate  325 (65 FE) MG EC tablet Take 1 tablet (325 mg total) by mouth every other day.   gabapentin  300 MG capsule Commonly known as: NEURONTIN  Take 1 capsule (300 mg total) by mouth at bedtime.   ibuprofen  600 MG tablet Commonly known as: ADVIL  Take 1 tablet (600 mg total) by mouth every 6 (six) hours.   oxyCODONE  5 MG  immediate release tablet Commonly known as: Oxy IR/ROXICODONE  Take 1 tablet (5 mg total) by mouth every 6 (six) hours as needed for breakthrough pain.   sertraline  25 MG tablet Commonly known as: ZOLOFT  Take 1 tablet (25 mg total) by mouth daily.   Vitafol  Gummies 3.33-0.333-34.8 MG Chew Chew 1 tablet by mouth daily.         Discharge home in stable condition Infant Feeding:  Formula Infant Disposition:home with mother Discharge instruction: per After Visit Summary and Postpartum booklet. Activity: Advance  as tolerated. Pelvic rest for 6 weeks.  Diet: routine diet Future Appointments: Future Appointments  Date Time Provider Department Center  12/03/2023  8:35 AM Delores Nidia CROME, FNP [facility]   Follow up Visit:  Follow-up Information     [facility] Follow up in 1 week(s).   Specialty: Obstetrics and Gynecology Contact information: [address] [place]  [address] [phone]                Message sent by Erik on 1/4  Please schedule this patient for a In person postpartum visit in 6 weeks with the following provider: Any provider. Additional Postpartum F/U: MD only visit with GYN surgeon to discuss interval BTL (history of umbilical hernia repair with mesh)   Low risk pregnancy Delivery mode:  Vaginal, Spontaneous Anticipated Birth Control:  Plans Interval BTL. Planned for dose of Depo prior to discharge.    Jeptha Hinnenkamp

## 2023-11-30 NOTE — Progress Notes (Signed)
 LABOR PROGRESS NOTE  Patient Name: [name] [name], female   DOB: 1991-07-19, 33 y.o.  MRN: [id number]  Patient walking halls. Uncomfortable. Slight cervical change since last check and AROM. Start pit. Cat I tracing.  [name], MD FMOB Fellow, Faculty practice [facility], Center for [facility] 11/30/23  3:01 AM

## 2023-12-01 DIAGNOSIS — Z8719 Personal history of other diseases of the digestive system: Secondary | ICD-10-CM

## 2023-12-01 LAB — CBC
HCT: 27.4 % — ABNORMAL LOW (ref 36.0–46.0)
Hemoglobin: 8.7 g/dL — ABNORMAL LOW (ref 12.0–15.0)
MCH: 24.6 pg — ABNORMAL LOW (ref 26.0–34.0)
MCHC: 31.8 g/dL (ref 30.0–36.0)
MCV: 77.4 fL — ABNORMAL LOW (ref 80.0–100.0)
Platelets: 219 10*3/uL (ref 150–400)
RBC: 3.54 MIL/uL — ABNORMAL LOW (ref 3.87–5.11)
RDW: 14.7 % (ref 11.5–15.5)
WBC: 18.8 10*3/uL — ABNORMAL HIGH (ref 4.0–10.5)
nRBC: 0 % (ref 0.0–0.2)

## 2023-12-01 MED ORDER — CYCLOBENZAPRINE HCL 5 MG PO TABS
10.0000 mg | ORAL_TABLET | Freq: Three times a day (TID) | ORAL | Status: DC
  Administered 2023-12-01 – 2023-12-02 (×4): 10 mg via ORAL
  Filled 2023-12-01 (×4): qty 2

## 2023-12-01 MED ORDER — FERROUS SULFATE 325 (65 FE) MG PO TABS
325.0000 mg | ORAL_TABLET | ORAL | Status: DC
  Administered 2023-12-01: 325 mg via ORAL
  Filled 2023-12-01: qty 1

## 2023-12-01 MED ORDER — OXYCODONE HCL 5 MG PO TABS
5.0000 mg | ORAL_TABLET | Freq: Four times a day (QID) | ORAL | Status: DC | PRN
  Administered 2023-12-01 – 2023-12-02 (×5): 10 mg via ORAL
  Filled 2023-12-01 (×5): qty 2

## 2023-12-01 MED ORDER — GABAPENTIN 300 MG PO CAPS
300.0000 mg | ORAL_CAPSULE | Freq: Every day | ORAL | Status: DC
  Administered 2023-12-01: 300 mg via ORAL
  Filled 2023-12-01: qty 1

## 2023-12-01 MED ORDER — SERTRALINE HCL 25 MG PO TABS
25.0000 mg | ORAL_TABLET | Freq: Every day | ORAL | Status: DC
  Administered 2023-12-01 – 2023-12-02 (×2): 25 mg via ORAL
  Filled 2023-12-01 (×2): qty 1

## 2023-12-01 NOTE — Progress Notes (Addendum)
 CSW received consult for Edinburgh Postnatal Depression Scale score of 12. CSW met with MOB to offer support and complete assessment. When CSW entered room, MOB was observed sitting in hospital bed. Infant was asleep on her back in bassinet. MOB's cousins were present. CSW introduced self and requested to speak with MOB alone. Visitors left room. CSW explained reason for consult. MOB presented as calm and was agreeable to consult. MOB remained engaged during visit and presented with a depressed mood and flat affect.   CSW inquired how MOB has felt emotionally since infant's arrival. MOB shares that since delivery, she feels okay. CSW acknowledged MOB's Edinburgh score of 12 and inquired how MOB has felt emotionally leading up to infant's arrival. MOB shared that for the past couple weeks, MOB has been in bed depressed. CSW inquired about stressors. MOB shares that she is financially stressed, reporting she had to stop working 2 weeks ago due to her pregnancy and she is behind on rent and utilities. MOB states she is at risk of being evicted from her home. MOB shares she has contacted several agencies for assistance including [employer], [employer], and [employer] [facility]). MOB shares she rents from a private owner who has threatened eviction but has not given MOB an official notice. MOB reports that she was hospitalized for several days around July due to pregnancy complications and has been behind on her bills since this time. MOB tried talking with her landlord about her financial difficulties but her landlord refused to make alterative arrangements. MOB reports that she is able to get assistance with her electric bill through [facility] but has not yet found rental assistance. CSW provided MOB with a new parent resource list with additional rental/utility assistance agencies and shared information about the [employer] Scholarship to apply for  financial assistance. CSW shared weekday CSW contact information to complete a healthcare verification form once MOB applies for financial assistance.   CSW inquired about MOB's mood throughout her pregnancy. MOB reports a depressed mood since the beginning of her pregnancy, marked by endorsing a low mood, crying often, loss of interest, isolating, and sleeping 2-3 days at a time. MOB also reports feelings of worry and racing thoughts. CSW inquired about feelings of connection with infant. MOB reports she does feel bonded with infant.  MOB reports she was diagnosed with depression and anxiety a couple years back and was prescribed and antidepressant (MOB was unable to recall the name of the medication)  a couple years ago. MOB shares she previously saw a therapist at [facility] in 2018 but is not current on psychotropic medication or therapy. MOB expressed interest in restarting an antidepressant and meeting with a therapist. CSW explained Integrated [facility]al [facility] therapy services through MOB's OBGYN clinic, [facility]. Patient requests a referral to Integrated Behavioral Health. Patient verbalizes understanding that the appointment will be virtual. CSW also provided MOB with an outpatient mental health resource list. MOB expressed interest starting antidepressants through her OBGYN provider while inpatient. CSW notified MOB's RN of MOB's request. MOB requested that OBGYN not talk about mental health in front of visitors. CSW communicated MOB's privacy request to MOB's RN.   CSW inquired about a history of postpartum depression/anxiety. MOB reports she experienced postpartum depression after the birth of her son in 2013. MOB shares her aunt passed away the week after her son was born and she recalls feeling sad and  crying often. MOB shares her symptoms lasted about 6 months and resolved without treatment.  CSW inquired about current coping skills. MOB  identified journaling, reading poetry, and listening to meditation as coping skills. CSW commended MOB for utilizing coping skills and encouraged MOB to prioritize rest and self care as able while she recovers postpartum. MOB shares she plans to take 1 week off from work and return due to financial stress. MOB shares her grandmother lives with her and will help care for her older son and newborn while she works. MOB shares that FOB is supportive but adds that he has 5 other children and is limited in how he can assist financially. MOB denied current SI/HI/DV.  CSW provided education regarding the baby blues period vs. perinatal mood disorders, discussed treatment and gave resources for mental health follow up if concerns arise.  CSW recommends self-evaluation during the postpartum time period using the New Mom Checklist from Postpartum Progress and encouraged MOB to contact a medical professional if symptoms are noted at any time.    MOB reports she has a car seat, bassinet, and clothing for infant but is in need of more diapers. CSW provided MOB with [employer] information for additional baby supplies. MOB reports she receives [facility] and plans to reapply for food stamps. MOB shares she uses [employer] for transportation. CSW provided MOB with Medicaid transportation information. MOB has chosen [facility] for infant's follow up care.  CSW provided review of Sudden Infant Death Syndrome (SIDS) precautions.    CSW identifies no further need for intervention and no barriers to discharge at this time.  Signed,  Sharyne LOIS Roulette, MSW, LCSWA, LCASA 08-16-24 2:55 PM

## 2023-12-01 NOTE — Anesthesia Postprocedure Evaluation (Signed)
 Anesthesia Post Note  Patient: [name] [name]  Procedure(s) Performed: AN AD HOC LABOR EPIDURAL     Patient location during evaluation: Mother Baby Anesthesia Type: Epidural Level of consciousness: awake and alert Pain management: pain level controlled Vital Signs Assessment: post-procedure vital signs reviewed and stable Respiratory status: spontaneous breathing, nonlabored ventilation and respiratory function stable Cardiovascular status: stable Postop Assessment: no headache, no backache, epidural receding, no apparent nausea or vomiting, patient able to bend at knees, able to ambulate and adequate PO intake Anesthetic complications: no   No notable events documented.  Last Vitals:  Vitals:   12/01/23 0018 12/01/23 0526  BP: (!) 126/59 116/73  Pulse: 89 91  Resp: 19 18  Temp: 36.7 C 36.6 C  SpO2: 98% 98%    Last Pain:  Vitals:   12/01/23 0753  TempSrc:   PainSc: 8    Pain Goal:                   Thomos Janetta Lout

## 2023-12-01 NOTE — Plan of Care (Signed)
   Problem: Education: Goal: Knowledge of General Education information will improve Description: Including pain rating scale, medication(s)/side effects and non-pharmacologic comfort measures Outcome: Completed/Met

## 2023-12-01 NOTE — Progress Notes (Signed)
 Daily Post Partum Note  12/01/2023 [name] [name] is a 33 y.o. [id number] PPD#1 s/p SVD/multiple 1st degree lacerations (not repaired) at [redacted]w[redacted]d.  Pregnancy c/b h/o umbilical mesh, chronic anemia, and chorio during labor. Patient induced electively 24hr/overnight events:  none  Subjective:  Meeting all PP goals. No s/s of anemia  Objective:     Current Vital Signs 24h Vital Sign Ranges  T 97.8 F (36.6 C) Temp  Avg: 98.7 F (37.1 C)  Min: 97.8 F (36.6 C)  Max: 101.2 F (38.4 C)  BP 116/73 BP  Min: 84/40  Max: 129/96  HR 91 Pulse  Avg: 99.1  Min: 78  Max: 131  RR 18 Resp  Avg: 18.2  Min: 18  Max: 19  SaO2 98 %   SpO2  Avg: 98.4 %  Min: 97 %  Max: 100 %       24 Hour I/O Current Shift I/O  Time Ins Outs 01/04 0701 - 01/05 0700 In: -  Out: 1145 [Urine:1025] No intake/output data recorded.   General: NAD Abdomen: soft, nttp. Firm fundus, nttp. Perineum: deferred Skin:  Warm and dry.  Cardiovascular: S1, S2 normal, no murmur, rub or gallop, regular rate and rhythm Respiratory:  Clear to auscultation bilateral. Normal respiratory effort  Medications Current Facility-Administered Medications  Medication Dose Route Frequency Provider Last Rate Last Admin   sodium chloride  flush (NS) 0.9 % injection 3 mL  3 mL Intravenous Q12H Leveque, Alyssa, MD       And   sodium chloride  flush (NS) 0.9 % injection 3 mL  3 mL Intravenous PRN Leveque, Alyssa, MD       And   0.9 %  sodium chloride  infusion  250 mL Intravenous PRN Leveque, Alyssa, MD       acetaminophen  (TYLENOL ) tablet 650 mg  650 mg Oral Q4H PRN Leveque, Alyssa, MD   650 mg at 12/01/23 9272   benzocaine -Menthol  (DERMOPLAST) 20-0.5 % topical spray 1 Application  1 Application Topical PRN Leveque, Alyssa, MD   1 Application at 11/30/23 1712   coconut oil  1 Application Topical PRN Leveque, Alyssa, MD       cyclobenzaprine  (FLEXERIL ) tablet 10 mg  10 mg Oral TID Nicholaus Almarie HERO, MD   10 mg at 12/01/23 9243   witch hazel-glycerin   (TUCKS) pad 1 Application  1 Application Topical PRN Leveque, Alyssa, MD       And   dibucaine (NUPERCAINAL) 1 % rectal ointment 1 Application  1 Application Rectal PRN Leveque, Alyssa, MD   1 Application at 11/30/23 1743   diphenhydrAMINE  (BENADRYL ) capsule 25 mg  25 mg Oral Q6H PRN Leveque, Alyssa, MD       ibuprofen  (ADVIL ) tablet 600 mg  600 mg Oral Q6H Leveque, Alyssa, MD   600 mg at 12/01/23 0532   measles, mumps & rubella vaccine (MMR) injection 0.5 mL  0.5 mL Subcutaneous Once Leveque, Alyssa, MD       ondansetron  (ZOFRAN ) tablet 4 mg  4 mg Oral Q4H PRN Loyola Fish, MD       Or   ondansetron  (ZOFRAN ) injection 4 mg  4 mg Intravenous Q4H PRN Leveque, Alyssa, MD       oxyCODONE  (Oxy IR/ROXICODONE ) immediate release tablet 5-10 mg  5-10 mg Oral Q6H PRN Nicholaus Almarie HERO, MD   10 mg at 12/01/23 9273   prenatal multivitamin tablet 1 tablet  1 tablet Oral Q1200 Leveque, Alyssa, MD       senna-docusate (Senokot-S) tablet 2  tablet  2 tablet Oral Q24H Leveque, Alyssa, MD       simethicone  (MYLICON) chewable tablet 80 mg  80 mg Oral PRN Leveque, Alyssa, MD       Tdap (BOOSTRIX) injection 0.5 mL  0.5 mL Intramuscular Once Leveque, Alyssa, MD       zolpidem  (AMBIEN ) tablet 5 mg  5 mg Oral QHS PRN Loyola Fish, MD        Labs:  Recent Labs  Lab 11/29/23 1634 12/01/23 0424  WBC 13.1* 18.8*  HGB 9.4* 8.7*  HCT 30.3* 27.4*  PLT 277 219  O POS RPR neg  Assessment & Plan:  Patient doing well *Postpartum/postop: routine care. Girl. Breast & formula. H/o umbilical hernia repair with mesh. F/u as outpatient for consideration for interval BTL.  *acute on chronic anemia: f/u s/s. Po iron ordered *Dispo: tomorrow.   Bebe Izell Overcast MD Attending Center for [facility])

## 2023-12-02 ENCOUNTER — Other Ambulatory Visit: Payer: Self-pay

## 2023-12-02 ENCOUNTER — Other Ambulatory Visit (HOSPITAL_COMMUNITY): Payer: Self-pay

## 2023-12-02 MED ORDER — GABAPENTIN 300 MG PO CAPS
300.0000 mg | ORAL_CAPSULE | Freq: Every day | ORAL | 0 refills | Status: DC
  Filled 2023-12-02: qty 30, 30d supply, fill #0

## 2023-12-02 MED ORDER — OXYCODONE HCL 5 MG PO TABS
5.0000 mg | ORAL_TABLET | Freq: Four times a day (QID) | ORAL | 0 refills | Status: DC | PRN
  Filled 2023-12-02: qty 5, 2d supply, fill #0

## 2023-12-02 MED ORDER — GABAPENTIN 300 MG PO CAPS: 300.0000 mg | ORAL_CAPSULE | Freq: Every day | ORAL | 0 refills | Status: DC

## 2023-12-02 MED ORDER — IBUPROFEN 600 MG PO TABS
600.0000 mg | ORAL_TABLET | Freq: Four times a day (QID) | ORAL | 0 refills | Status: DC
  Filled 2023-12-02: qty 30, 8d supply, fill #0

## 2023-12-02 MED ORDER — SERTRALINE HCL 25 MG PO TABS
25.0000 mg | ORAL_TABLET | Freq: Every day | ORAL | 2 refills | Status: DC
  Filled 2023-12-02: qty 30, 30d supply, fill #0

## 2023-12-02 MED ORDER — ACETAMINOPHEN 325 MG PO TABS
650.0000 mg | ORAL_TABLET | ORAL | 0 refills | Status: DC | PRN
  Filled 2023-12-02: qty 60, 5d supply, fill #0

## 2023-12-02 MED ORDER — SERTRALINE HCL 25 MG PO TABS: 25.0000 mg | ORAL_TABLET | Freq: Every day | ORAL | 2 refills | Status: DC

## 2023-12-02 NOTE — Plan of Care (Signed)
  Problem: Elimination: Goal: Will not experience complications related to urinary retention Outcome: Progressing   Problem: Education: Goal: Individualized Educational Video(s) Outcome: Progressing   Problem: Education: Goal: Individualized Newborn Educational Video(s) Outcome: Progressing   Problem: Activity: Goal: Will verbalize the importance of balancing activity with adequate rest periods Outcome: Progressing Goal: Ability to tolerate increased activity will improve Outcome: Progressing   Problem: Coping: Goal: Ability to identify and utilize available resources and services will improve Outcome: Progressing   Problem: Life Cycle: Goal: Chance of risk for complications during the postpartum period will decrease Outcome: Progressing   Problem: Role Relationship: Goal: Ability to demonstrate positive interaction with newborn will improve Outcome: Progressing   Problem: Skin Integrity: Goal: Demonstration of wound healing without infection will improve Outcome: Progressing   Problem: Education: Goal: Knowledge of condition will improve Outcome: Progressing Goal: Individualized Educational Video(s) Outcome: Progressing Goal: Individualized Newborn Educational Video(s) Outcome: Progressing   Problem: Activity: Goal: Will verbalize the importance of balancing activity with adequate rest periods Outcome: Progressing Goal: Ability to tolerate increased activity will improve Outcome: Progressing   Problem: Coping: Goal: Ability to identify and utilize available resources and services will improve Outcome: Progressing   Problem: Life Cycle: Goal: Chance of risk for complications during the postpartum period will decrease Outcome: Progressing   Problem: Role Relationship: Goal: Ability to demonstrate positive interaction with newborn will improve Outcome: Progressing   Problem: Skin Integrity: Goal: Demonstration of wound healing without infection will  improve Outcome: Progressing

## 2023-12-02 NOTE — Plan of Care (Signed)
  Problem: Elimination: Goal: Will not experience complications related to urinary retention 12/02/2023 0847 by Madison Rosina LABOR, LPN Outcome: Adequate for Discharge 12/02/2023 0718 by Madison Rosina LABOR, LPN Outcome: Progressing   Problem: Education: Goal: Individualized Educational Video(s) 12/02/2023 0847 by Madison Rosina LABOR, LPN Outcome: Adequate for Discharge 12/02/2023 0718 by Madison Rosina LABOR, LPN Outcome: Progressing   Problem: Education: Goal: Individualized Newborn Educational Video(s) 12/02/2023 0847 by Madison Rosina LABOR, LPN Outcome: Adequate for Discharge 12/02/2023 0718 by Madison Rosina LABOR, LPN Outcome: Progressing   Problem: Activity: Goal: Will verbalize the importance of balancing activity with adequate rest periods 12/02/2023 0847 by Madison Rosina LABOR, LPN Outcome: Adequate for Discharge 12/02/2023 0718 by Madison Rosina LABOR, LPN Outcome: Progressing Goal: Ability to tolerate increased activity will improve 12/02/2023 0847 by Madison Rosina LABOR, LPN Outcome: Adequate for Discharge 12/02/2023 0718 by Madison Rosina LABOR, LPN Outcome: Progressing   Problem: Coping: Goal: Ability to identify and utilize available resources and services will improve 12/02/2023 0847 by Madison Rosina LABOR, LPN Outcome: Adequate for Discharge 12/02/2023 0718 by Madison Rosina LABOR, LPN Outcome: Progressing   Problem: Life Cycle: Goal: Chance of risk for complications during the postpartum period will decrease 12/02/2023 0847 by Madison Rosina LABOR, LPN Outcome: Adequate for Discharge 12/02/2023 0718 by Madison Rosina LABOR, LPN Outcome: Progressing   Problem: Role Relationship: Goal: Ability to demonstrate positive interaction with newborn will improve 12/02/2023 0847 by Madison Rosina LABOR, LPN Outcome: Adequate for Discharge 12/02/2023 0718 by Madison Rosina LABOR, LPN Outcome: Progressing   Problem: Skin Integrity: Goal: Demonstration of wound healing without infection will improve 12/02/2023 0847 by Madison Rosina LABOR, LPN Outcome: Adequate for  Discharge 12/02/2023 0718 by Madison Rosina LABOR, LPN Outcome: Progressing   Problem: Education: Goal: Knowledge of condition will improve 12/02/2023 0847 by Madison Rosina LABOR, LPN Outcome: Adequate for Discharge 12/02/2023 0718 by Madison Rosina LABOR, LPN Outcome: Progressing Goal: Individualized Educational Video(s) 12/02/2023 0847 by Madison Rosina LABOR, LPN Outcome: Adequate for Discharge 12/02/2023 0718 by Madison Rosina LABOR, LPN Outcome: Progressing Goal: Individualized Newborn Educational Video(s) 12/02/2023 0847 by Madison Rosina LABOR, LPN Outcome: Adequate for Discharge 12/02/2023 0718 by Madison Rosina LABOR, LPN Outcome: Progressing   Problem: Activity: Goal: Will verbalize the importance of balancing activity with adequate rest periods 12/02/2023 0847 by Madison Rosina LABOR, LPN Outcome: Adequate for Discharge 12/02/2023 0718 by Madison Rosina LABOR, LPN Outcome: Progressing Goal: Ability to tolerate increased activity will improve 12/02/2023 0847 by Madison Rosina LABOR, LPN Outcome: Adequate for Discharge 12/02/2023 0718 by Madison Rosina LABOR, LPN Outcome: Progressing   Problem: Coping: Goal: Ability to identify and utilize available resources and services will improve 12/02/2023 0847 by Madison Rosina LABOR, LPN Outcome: Adequate for Discharge 12/02/2023 0718 by Madison Rosina LABOR, LPN Outcome: Progressing   Problem: Life Cycle: Goal: Chance of risk for complications during the postpartum period will decrease 12/02/2023 0847 by Madison Rosina LABOR, LPN Outcome: Adequate for Discharge 12/02/2023 0718 by Madison Rosina LABOR, LPN Outcome: Progressing   Problem: Role Relationship: Goal: Ability to demonstrate positive interaction with newborn will improve 12/02/2023 0847 by Madison Rosina LABOR, LPN Outcome: Adequate for Discharge 12/02/2023 0718 by Madison Rosina LABOR, LPN Outcome: Progressing   Problem: Skin Integrity: Goal: Demonstration of wound healing without infection will improve 12/02/2023 0847 by Madison Rosina LABOR, LPN Outcome: Adequate for Discharge 12/02/2023  0718 by Madison Rosina LABOR, LPN Outcome: Progressing

## 2023-12-03 ENCOUNTER — Telehealth (HOSPITAL_COMMUNITY): Payer: Self-pay | Admitting: *Deleted

## 2023-12-03 ENCOUNTER — Encounter: Payer: Medicaid Other | Admitting: Obstetrics and Gynecology

## 2023-12-03 DIAGNOSIS — Z1331 Encounter for screening for depression: Secondary | ICD-10-CM

## 2023-12-03 NOTE — Telephone Encounter (Signed)
 Patient scored 15 on EPDS in the hospital, answer to question ten was "0-Never".  Placed order for IBH referral.  Dr. Donavan Foil notified via [facility] order.  Salena Saner, RN 12/03/2023 13:32

## 2023-12-05 NOTE — BH Specialist Note (Unsigned)
Integrated Behavioral Health via Telemedicine Visit  12/18/2023 Chakakhan Hannegan [id number]  Number of Integrated Behavioral Health Clinician visits: 1- Initial Visit  Session Start time: 1439   Session End time: 1520  Total time in minutes: 41   Referring Provider: Harvie Bridge, MD Patient/Family location: Home [facility] Provider location: Center for [facility]  All persons participating in visit: Patient [name] [name] and [facility] Amonie Wisser   Types of Service: Individual psychotherapy and Video visit  I connected with Remigio Eisenmenger and/or Office manager  n/a  via  Psychologist, clinical  (Video is Surveyor, mining) and verified that I am speaking with the correct person using two identifiers. Discussed confidentiality: Yes   I discussed the limitations of telemedicine and the availability of in person appointments.  Discussed there is a possibility of technology failure and discussed alternative modes of communication if that failure occurs.  I discussed that engaging in this telemedicine visit, they consent to the provision of behavioral healthcare and the services will be billed under their insurance.  Patient and/or legal guardian expressed understanding and consented to Telemedicine visit: Yes   Presenting Concerns: Patient and/or family reports the following symptoms/concerns: Anxiety and depression; attributed primarily to  life stress increased postpartum (evicted/staying with a friend; no maternity leave; EBT cut in 1/2 with no income, no transportation, no childcare yet); pt feeling more physical pain that she expected with a vaginal birth; open to community resources and utilizing a self-coping strategy to help manage anxiety in her current situation.  Duration of problem: Increase postpartum; Severity of problem:  moderately severe  Patient and/or Family's Strengths/Protective  Factors: Social connections, Concrete supports in place (healthy food, safe environments, etc.), and Sense of purpose  Goals Addressed: Patient will:  Reduce symptoms of: anxiety, depression, and stress   Increase knowledge and/or ability of: healthy habits and self-management skills   Demonstrate ability to: Increase healthy adjustment to current life circumstances and Increase adequate support systems for patient/family  Progress towards Goals: Ongoing  Interventions: Interventions utilized:  Mindfulness or Management consultant, Psychoeducation and/or Health Education, and Link to [employer] Standardized Assessments completed: Not Needed  Patient and/or Family Response: Patient agrees with treatment plan.   Assessment: Patient currently experiencing Adjustment disorder with mixed anxious and depressed mood; Psychosocial tress.   Patient may benefit from psychoeducation and brief therapeutic interventions regarding coping with symptoms of depression, anxiety, life stress .  Plan: Follow up with behavioral health clinician on : Two weeks Behavioral recommendations:  -Continue taking Zoloft as prescribed -CALM relaxation breathing exercise twice daily (morning; at bedtime with sleep sounds); as needed throughout the day. --Continue prioritizing healthy self-care (regular meals, adequate rest; allowing practical help from supportive friends and family) until at least postpartum medical appointment -Consider new mom support group as needed at either www.postpartum.net or www.conehealthybaby.com  --Read through information on After Visit Summary; use as needed and discussed  Referral(s): Housing, transportation, child care  I discussed the assessment and treatment plan with the patient and/or parent/guardian. They were provided an opportunity to ask questions and all were answered. They agreed with the plan and demonstrated an understanding of the instructions.   They were  advised to call back or seek an in-person evaluation if the symptoms worsen or if the condition fails to improve as anticipated.  Rae Lips, LCSW     06/23/2018    3:14 PM  Depression screen PHQ 2/9  Decreased Interest 2  Down, Depressed, Hopeless 2  PHQ - 2 Score 4  Altered sleeping 2  Tired, decreased energy 2  Change in appetite 2  Feeling bad or failure about yourself  3  Trouble concentrating 2  Moving slowly or fidgety/restless 3  Suicidal thoughts 0  PHQ-9 Score 18      06/23/2018    3:14 PM  GAD 7 : Generalized Anxiety Score  Nervous, Anxious, on Edge 3  Control/stop worrying 3  Worry too much - different things 3  Trouble relaxing 3  Restless 3  Easily annoyed or irritable 3  Afraid - awful might happen 2  Total GAD 7 Score 20      12/01/2023    5:36 AM  Edinburgh Postnatal Depression Scale Screening Tool  I have been able to laugh and see the funny side of things. 2  I have looked forward with enjoyment to things. 1  I have blamed myself unnecessarily when things went wrong. 2  I have been anxious or worried for no good reason. 2  I have felt scared or panicky for no good reason. 2  Things have been getting on top of me. 2  I have been so unhappy that I have had difficulty sleeping. 2  I have felt sad or miserable. 1  I have been so unhappy that I have been crying. 1  The thought of harming myself has occurred to me. 0  Edinburgh Postnatal Depression Scale Total 15

## 2023-12-11 ENCOUNTER — Inpatient Hospital Stay (HOSPITAL_COMMUNITY): Payer: Medicaid Other

## 2023-12-12 ENCOUNTER — Telehealth (HOSPITAL_COMMUNITY): Payer: Self-pay | Admitting: *Deleted

## 2023-12-12 NOTE — Telephone Encounter (Signed)
12/12/2023  Name: Marialis Kuder MRN: [id number] DOB: [birth year]  Reason for Call:  Transition of Care Hospital Discharge Call  Contact Status: Patient Contact Status: Message  Language assistant needed:          Follow-Up Questions:    Inocente Salles Postnatal Depression Scale:  In the Past 7 Days:    PHQ2-9 Depression Scale:     Discharge Follow-up:    Post-discharge interventions: NA  Salena Saner, RN 12/12/2023 11:14

## 2023-12-18 ENCOUNTER — Ambulatory Visit: Payer: Medicaid Other | Admitting: Clinical

## 2023-12-18 DIAGNOSIS — F4323 Adjustment disorder with mixed anxiety and depressed mood: Secondary | ICD-10-CM

## 2023-12-18 DIAGNOSIS — Z658 Other specified problems related to psychosocial circumstances: Secondary | ICD-10-CM

## 2023-12-18 NOTE — Patient Instructions (Addendum)
Center for [facility] at [facility] for Women [address] [place], [place] [address] [phone] (main office) [phone] (Annastyn Silvey's office)  [facility] www.womenscentergso.org   New Parent Support Groups www.postpartum.net www.conehealthybaby.com  Estate manager/land agent  (Childcare options, Early childcare development, etc.) DietDisorder.cz  [employer] Child Care Facility Search Engine  https://ncchildcare.http://cook.com/    Housing Resources [facility] [address], [place], [place] [address] [phone] www.gha-[place].[facility] [address] [place], [place] [address] [phone] PhoneCaptions.ch **Programs include: Hospital doctor and Housing Counseling, Healthy Radiographer, therapeutic, Homeless Prevention and Housing Assistance  Government [facility] [address], [place], [place] [address] [phone] www.PaintingEmporium.co.za **housing applications/recertification; tax payment relief/exemption under specific qualifications  [facility] [address] [place], [place] [address] www.onlinegreensboro.com/~maryshouse **transitional housing for women in recovery who have minor children or are pregnant  [facility] [address] [place], [place], [place] [address] https://johnson-smith.net/  **emergency shelter and support services for families facing homelessness  [employer] [address] [place], [place] [address] [phone] www.youthfocus.org **transitional housing to pregnant women; emergency housing for youth who have run away, are experiencing a family crisis, are victims of abuse or neglect, or are homeless  [facility] [address] [place], [place] [address] [phone] ircgso.org **Drop-in center for people experiencing homelessness; overnight warming  center when temperature is 25 degrees or below  [employer] [address] [place], [place], [place] [address] [phone] https://reentrystaffingagency.org/ **help with affordable housing to people experiencing homelessness or unemployment due to incarceration  [facility] [address], [place], [place] [address] [phone] www.greensborourbanministry.org  **emergency and transitional housing, rent/mortgage assistance, utility assistance  [facility] [address] [place], [place] [address] [phone] www.salvationarmyofgreensboro.org **emergency and transitional housing  [employer] for [employer] [address], [place], [place] [address] [phone] Www.habitatgreensboro.[facility]   [employer] [address], [place], [place] [address] [phone] https://chshousing.org **Home Ownership/Affordable Housing Program and [facility]  [employer] [facility] [address], [place], [place] [address] [phone] PaidValue.com.cy **home buyer education courses, foreclosure prevention  [facility] [address] [place], [place] [address] [phone] DefMagazine.is **Environmental Exposure Assessment (investigation of homes where either children or pregnant women with a confirmed elevated blood lead level reside)  [facility] [address] [place], [place] [address] [phone] ShowReturn.ca **Home Expense Assistance/Repairs Program; offers home accessibility updates, such as ramps or bars in the bathroom  [facility] [address], [place], [place] [address] [phone] https://www.self-help.org/locations/[place]-branch **Offers credit-building and banking services to people  unable to use traditional banking  Transportation Resources [employer] ([employer]) [address] [place] [place], [place], [place] [address] https://www.[place].gov/departments/transportation/gdot-divisions/[place]-transit-agency-public-transportation-division     Fixed-route bus services, including regional fare cards for PART, [place], [place], and [place] buses.  Reduced fare bus ID's available for Medicaid, Medicare, and "orange card" recipients.  [employer] offers curb-to-curb and door-to-door bus services for people with disabilities who are unable to use a fixed-bus route; also offers a shared-ride program.   Helpful tips:  -Routes available online and physical maps available at the main bus hub lobby (each for a specific route) -Smartphone directions often include bus routes (see the "bus" icon, next to the "car" and "walk" icons) -Routes differ on weekends, evenings and holidays, so plan ahead!  -If you have  Medicaid, Medicare, or orange card, plan to obtain a reduced-fare ID to save 50% on rides. Check days and times to obtain an ID, and bring all necessary documents.   [employer] [address] [place], [place] [address] [phone] www.wheels4hope.org **REFERRAL NEEDED by specific agencies (see website), after meeting specified criteria only

## 2023-12-19 ENCOUNTER — Telehealth: Payer: Self-pay | Admitting: *Deleted

## 2023-12-19 NOTE — Telephone Encounter (Signed)
Voicemail message received from [facility] RN who stated she had visited pt earlier today. Pt delivered her baby on 11/30/23 and is now having new onset of heavier bleeding. She is changing her pad every 2 hours and this is happening every other Aylan Bayona. Pt is asymptomatic. The RN also stated that pt has a history of migraine headaches and has been having some H/A which are not relieved with Tylenol and Ibuprofen. Pt's BP was 122/86. Pt can be reached @ [phone]

## 2023-12-23 ENCOUNTER — Encounter: Payer: Self-pay | Admitting: Family Medicine

## 2023-12-23 NOTE — BH Specialist Note (Signed)
 Integrated Behavioral Health via Telemedicine Visit  01/01/2024 [name] [name] [id number]  Number of Integrated Behavioral Health Clinician visits: 2- Second Visit  Session Start time: [phone]   Session End time: 1021  Total time in minutes: 23   Referring Provider: Kieth Carolin, MD Patient/Family location: Home [facility] Provider location: Center for [facility]  All persons participating in visit: Patient [name] [name] and [facility] Zosia Lucchese   Types of Service: Individual psychotherapy and Video visit  I connected with Jeoffrey Code and/or Office Manager  n/a  via  Psychologist, Clinical  (Video is Surveyor, mining) and verified that I am speaking with the correct person using two identifiers. Discussed confidentiality: Yes   I discussed the limitations of telemedicine and the availability of in person appointments.  Discussed there is a possibility of technology failure and discussed alternative modes of communication if that failure occurs.  I discussed that engaging in this telemedicine visit, they consent to the provision of behavioral healthcare and the services will be billed under their insurance.  Patient and/or legal guardian expressed understanding and consented to Telemedicine visit: Yes   Presenting Concerns: Patient and/or family reports the following symptoms/concerns: Additional life stress (unable to go back into work due to unknown health issue); stomach pain unmanaged, due to unable to take medication other than Tylenol  until after upcoming CT scan. Pt is coping by using TV and writing as distractions as well as looking for work-from-home position.  Duration of problem: Postpartum; Severity of problem:  moderately severe  Patient and/or Family's Strengths/Protective Factors: Social connections, Concrete supports in place (healthy food, safe environments, etc.), and Sense of  purpose  Goals Addressed: Patient will:  Reduce symptoms of: anxiety, depression, and stress   Increase knowledge and/or ability of: stress reduction   Demonstrate ability to: Increase healthy adjustment to current life circumstances  Progress towards Goals: Ongoing  Interventions: Interventions utilized:  Solution-Focused Strategies and Supportive Reflection Standardized Assessments completed: Not Needed  Patient and/or Family Response: Patient agrees with treatment plan.   Assessment: Patient currently experiencing Adjustment disorder with mixed anxious and depressed mood; Psychosocial stress.   Patient may benefit from continued therapeutic intervention  .  Plan: Follow up with behavioral health clinician on : Two weeks Behavioral recommendations:  -Continue following medical recommendations regarding when to resume medications -Continue using daily self-coping strategies (relaxation breathing, journal, TV shows) daily as needed -Continue applying for work-from-home positions, while waiting for results of CT scan -Continue plan to use [employer] as needed; continue working with EBT worker regarding increasing benefit while out of work Referral(s): Integrated [employer] (In Clinic)  I discussed the assessment and treatment plan with the patient and/or parent/guardian. They were provided an opportunity to ask questions and all were answered. They agreed with the plan and demonstrated an understanding of the instructions.   They were advised to call back or seek an in-person evaluation if the symptoms worsen or if the condition fails to improve as anticipated.  Warren JAYSON Mering, LCSW     06/23/2018    3:14 PM  Depression screen PHQ 2/9  Decreased Interest 2  Down, Depressed, Hopeless 2  PHQ - 2 Score 4  Altered sleeping 2  Tired, decreased energy 2  Change in appetite 2  Feeling bad or failure about yourself  3  Trouble concentrating 2  Moving  slowly or fidgety/restless 3  Suicidal thoughts 0  PHQ-9 Score 18  06/23/2018    3:14 PM  GAD 7 : Generalized Anxiety Score  Nervous, Anxious, on Edge 3  Control/stop worrying 3  Worry too much - different things 3  Trouble relaxing 3  Restless 3  Easily annoyed or irritable 3  Afraid - awful might happen 2  Total GAD 7 Score 20

## 2023-12-24 NOTE — Telephone Encounter (Signed)
Left message that I am f/u [facility] nurse visit.  If she could please give the office a call back.    Leonette Nutting

## 2023-12-27 ENCOUNTER — Encounter: Payer: Self-pay | Admitting: Family Medicine

## 2023-12-27 ENCOUNTER — Ambulatory Visit: Payer: Medicaid Other | Admitting: Family Medicine

## 2023-12-27 DIAGNOSIS — R1084 Generalized abdominal pain: Secondary | ICD-10-CM | POA: Diagnosis not present

## 2023-12-27 DIAGNOSIS — L989 Disorder of the skin and subcutaneous tissue, unspecified: Secondary | ICD-10-CM

## 2023-12-27 MED ORDER — IBUPROFEN 600 MG PO TABS: 600.0000 mg | ORAL_TABLET | Freq: Four times a day (QID) | ORAL | 0 refills | Status: AC

## 2023-12-27 MED ORDER — HYDROCORTISONE 1 % EX OINT: 1.0000 | TOPICAL_OINTMENT | Freq: Two times a day (BID) | CUTANEOUS | 0 refills | Status: AC

## 2023-12-27 MED ORDER — SERTRALINE HCL 50 MG PO TABS: 50.0000 mg | ORAL_TABLET | Freq: Every day | ORAL | 3 refills | Status: AC

## 2023-12-27 MED ORDER — PROMETHAZINE HCL 25 MG PO TABS: 25.0000 mg | ORAL_TABLET | Freq: Four times a day (QID) | ORAL | 1 refills | Status: AC | PRN

## 2023-12-27 MED ORDER — ACETAMINOPHEN 500 MG PO TABS: 500.0000 mg | ORAL_TABLET | Freq: Four times a day (QID) | ORAL | 3 refills | Status: AC | PRN

## 2023-12-27 MED ORDER — GABAPENTIN 300 MG PO CAPS: 300.0000 mg | ORAL_CAPSULE | Freq: Every day | ORAL | 0 refills | Status: AC

## 2023-12-27 NOTE — Progress Notes (Signed)
Post Partum Visit Note  [name] [name] is a 33 y.o. G59P2002 female who presents for a postpartum visit. She is 4 week postpartum following a normal spontaneous vaginal delivery.  I have fully reviewed the prenatal and intrapartum course. The delivery was at 39.3 gestational weeks.  Anesthesia: epidural. Postpartum course has been good. Baby is doing well. Baby is feeding by bottle - Similac Advance. Bleeding  heavy then light every other day . Bowel function is normal. Bladder function is normal. Patient is not sexually active. Contraception method is none. Postpartum depression screening: positive.   The pregnancy intention screening data noted above was reviewed. Potential methods of contraception were discussed. The patient elected to proceed with No data recorded.   Edinburgh Postnatal Depression Scale - 12/27/23 1130       Edinburgh Postnatal Depression Scale:  In the Past 7 Days   I have been able to laugh and see the funny side of things. 0    I have looked forward with enjoyment to things. 3    I have blamed myself unnecessarily when things went wrong. 0    I have been anxious or worried for no good reason. 3    I have felt scared or panicky for no good reason. 3    Things have been getting on top of me. 3    I have been so unhappy that I have had difficulty sleeping. 2    I have felt sad or miserable. 2    I have been so unhappy that I have been crying. 2    The thought of harming myself has occurred to me. 0    Edinburgh Postnatal Depression Scale Total 18           Abdominal pain Patient is reporting worsening abdominal pain.  She has skin lesions that were present during the pregnancy on her left lower abdomen and a new skin lesion on her right lower abdomen.  She reports that lesions are soft and extremely painful.  She has not done anything for them.  She does have history of a hernia repair and feels that the mesh is on the right lower abdomen near where the new  lesion is but is unsure if it is where the other lesion is present.  Health Maintenance Due  Topic Date Due   COVID-19 Vaccine (1 - 2024-25 season) Never done    The following portions of the patient's history were reviewed and updated as appropriate: allergies, current medications, past family history, past medical history, past social history, past surgical history, and problem list.  Review of Systems Pertinent items are noted in HPI.  Objective:  BP 125/75   Pulse (!) 104   Wt 184 lb 14.4 oz (83.9 kg)   LMP 12/07/2022   Breastfeeding No   BMI 33.82 kg/m    General:  alert, cooperative, and appears stated age   Breasts:  not indicated  Lungs: Normal work of breathing   Heart:  Regular rate   Abdomen: Soft, tender to palpation around lesion that is just inferior and lateral to umbilicus on the left and on the right.  GU exam:  normal       Assessment:    There are no diagnoses linked to this encounter.  Normal postpartum exam.   Abdominal lesion of unclear etiology Unsure the cause of the 2 lesions on the patient's abdomen.  Their appearance is similar to a healed burn mark but patient denies any burns.  Discussed the option of continued monitoring, punch biopsy, imaging and elected for imaging first with topical steroid cream for 1 week.  Patient is unclear where mesh is located so will wait for imaging before doing punch biopsy.  Consider punch biopsy after imaging results return.  Plan:   Essential components of care per ACOG recommendations:  1.  Mood and well being: Patient with positive depression screening today. Reviewed local resources for support.  Patient follows with integrated behavioral health but needs refill on Zoloft.  Refill sent to patient's pharmacy. - Patient tobacco use? No.   - hx of drug use? No.    2. Infant care and feeding:  -Patient currently breastmilk feeding? No.  -Social determinants of health (SDOH) reviewed in EPIC. No concerns  3.  Sexuality, contraception and birth spacing - Patient does not want a pregnancy in the next year.  Desired family size is 2 children.  - Reviewed reproductive life planning. Reviewed contraceptive methods based on pt preferences and effectiveness.  Patient desired tubal ligation.  She was supposed to have postpartum tubal but due to abdominal mesh that was postponed and patient will have interval tubal.  Patient to be scheduled with gynecologist for consult for this. - Discussed birth spacing of 18 months  4. Sleep and fatigue -Encouraged family/partner/community support of 4 hrs of uninterrupted sleep to help with mood and fatigue  5. Physical Recovery  - Discussed patients delivery and complications. She describes her labor as good. - Patient had a Vaginal, no problems at delivery. Patient had abrasions.  Perineal healing reviewed. Patient expressed understanding - Patient has urinary incontinence? No. - Patient is safe to resume physical and sexual activity  6.  Health Maintenance - HM due items addressed Yes - Last pap smear  Diagnosis  Date Value Ref Range Status  06/14/2023   Final   - Negative for intraepithelial lesion or malignancy (NILM)   Pap smear not done at today's visit.  -Breast Cancer screening indicated? No.   7. Chronic Disease/Pregnancy Condition follow up: None  - PCP follow up  Celedonio Savage, MD Center for [facility], [facility]

## 2023-12-30 NOTE — Telephone Encounter (Signed)
Called patient following up her visit with [facility] nurse visit. Patient was seen by Dr. Nobie Putnam and has not had any changes in vaginal bleeding since that visit. Informed patient of precaution to return to [facility]. Patient stated that she has an CT schedule on 01/10/24. Patient also stated she told physician her interest in getting BTL done but had a hernia repair and needs to get a GYN visit to discuss her BTL option. Notify patient the front office can schedule her an GYN appointment and will be notify once it has been scheduled.  Marcelino Duster, RN

## 2024-01-01 ENCOUNTER — Ambulatory Visit (INDEPENDENT_AMBULATORY_CARE_PROVIDER_SITE_OTHER): Payer: Medicaid Other | Admitting: Clinical

## 2024-01-01 DIAGNOSIS — F4323 Adjustment disorder with mixed anxiety and depressed mood: Secondary | ICD-10-CM | POA: Diagnosis not present

## 2024-01-01 DIAGNOSIS — Z658 Other specified problems related to psychosocial circumstances: Secondary | ICD-10-CM

## 2024-01-07 NOTE — Telephone Encounter (Signed)
Patient is requesting new Rx for [facility] adding whole milk.

## 2024-01-10 ENCOUNTER — Ambulatory Visit (HOSPITAL_COMMUNITY): Admission: RE | Admit: 2024-01-10 | Payer: Medicaid Other | Source: Ambulatory Visit

## 2024-01-14 ENCOUNTER — Ambulatory Visit (HOSPITAL_COMMUNITY)
Admission: RE | Admit: 2024-01-14 | Discharge: 2024-01-14 | Disposition: A | Discharge status: Home / Self Care | Payer: Medicaid Other | Source: Ambulatory Visit | Attending: Family Medicine | Admitting: Family Medicine

## 2024-01-14 DIAGNOSIS — R1084 Generalized abdominal pain: Secondary | ICD-10-CM | POA: Insufficient documentation

## 2024-02-03 ENCOUNTER — Encounter: Payer: Self-pay | Admitting: Family Medicine

## 2024-02-12 ENCOUNTER — Other Ambulatory Visit: Payer: Self-pay

## 2024-02-12 ENCOUNTER — Ambulatory Visit: Payer: Medicaid Other | Admitting: Obstetrics and Gynecology

## 2024-02-12 VITALS — BP 119/74 | HR 96 | Wt 198.0 lb

## 2024-02-12 DIAGNOSIS — Z3009 Encounter for other general counseling and advice on contraception: Secondary | ICD-10-CM

## 2024-02-12 DIAGNOSIS — N6322 Unspecified lump in the left breast, upper inner quadrant: Secondary | ICD-10-CM | POA: Diagnosis not present

## 2024-02-12 DIAGNOSIS — L989 Disorder of the skin and subcutaneous tissue, unspecified: Secondary | ICD-10-CM | POA: Diagnosis not present

## 2024-02-12 NOTE — Progress Notes (Signed)
 GYNECOLOGY VISIT  Patient name: [name] [name] MRN [id number]  Date of birth: Dec 26, 1990 Chief Complaint:   GYN visit  History:  [name] [name] is a 33 y.o. G2P2002 being seen today for discussion of sterilization, skin lesion of unknown etiology, and tender breast lump.  Discussed the use of AI scribe software for clinical note transcription with the patient, who gave verbal consent to proceed.  History of Present Illness   [name] [name] is a 33 year old female who presents for discussion about tubal ligation.  She is seeking a tubal ligation and is very certain about her decision. She previously wanted it postpartum but it was decided against due to her history of umbilical hernia repair with mesh. She has a son and a daughter and feels her family is complete.  She has a concern regarding a lump in her right breast, which she noticed last week. The lump is painful, but there are no skin changes or nipple retraction. She mentions a white dot on the breast but notes she did not breastfeed or produce milk.  She also has persistent, painful bumps on her abdomen that have been present in the same spot without any skin changes. Previous CT scans have not provided clarity on the issue.   She works night shifts and has a disrupted sleep schedule due to her daughter's sleep pattern. Her son recently made the basketball team, adding to her responsibilities.         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   Sprain of MCL (medial collateral ligament) of knee 10/12/2021    Past Surgical History:  Procedure Laterality Date   BIOPSY  05/27/2018   Procedure: BIOPSY;  Surgeon: Graylin Shiver, MD;  Location: [facility]  ENDOSCOPY;  Service: Endoscopy;;   ESOPHAGOGASTRODUODENOSCOPY (EGD) WITH PROPOFOL N/A 09/11/2017   Procedure: ESOPHAGOGASTRODUODENOSCOPY (EGD) WITH PROPOFOL;  Surgeon: Charlott Rakes, MD;  Location: [facility] ENDOSCOPY;  Service: Endoscopy;  Laterality: N/A;   ESOPHAGOGASTRODUODENOSCOPY (EGD) WITH PROPOFOL N/A 02/20/2018   Procedure: ESOPHAGOGASTRODUODENOSCOPY (EGD) WITH PROPOFOL;  Surgeon: Rachael Fee, MD;  Location: [facility] ENDOSCOPY;  Service: Endoscopy;  Laterality: N/A;   ESOPHAGOGASTRODUODENOSCOPY (EGD) WITH PROPOFOL N/A 05/27/2018   Procedure: ESOPHAGOGASTRODUODENOSCOPY (EGD) WITH PROPOFOL WITH CLIP PLACEMENT;  Surgeon: Graylin Shiver, MD;  Location: [facility] ENDOSCOPY;  Service: Endoscopy;  Laterality: N/A;   INSERTION OF MESH N/A 12/26/2016   Procedure: INSERTION OF MESH;  Surgeon: Abigail Miyamoto, MD;  Location: [facility] ORS;  Service: General;  Laterality: N/A;   KNEE ARTHROSCOPY WITH MEDIAL PATELLAR FEMORAL LIGAMENT RECONSTRUCTION Left 03/09/2022   Procedure: LEFT KNEE ARTHROSCOPY WITH MEDIAL PATELLAR FEMORAL LIGAMENT RECONSTRUCTION WILL ALLOGRAFT;  Surgeon: Yolonda Kida, MD;  Location: [facility] ORS;  Service: Orthopedics;  Laterality: Left;   LAPAROSCOPY N/A 12/26/2016   Procedure: LAPAROSCOPY DIAGNOSTIC;  Surgeon: Abigail Miyamoto, MD;  Location: [facility] ORS;  Service: General;  Laterality: N/A;   UMBILICAL HERNIA REPAIR N/A 12/26/2016   Procedure: HERNIA REPAIR UMBILICAL ADULT;  Surgeon: Abigail Miyamoto, MD;  Location: [facility] ORS;  Service: General;  Laterality: N/A;    The following portions of the patient's history were reviewed and updated as appropriate: allergies, current medications, past family history, past medical history, past  social history, past surgical history and problem list.   Health Maintenance:   Last pap     Component Value Date/Time   DIAGPAP  06/14/2023 0945    - Negative for intraepithelial lesion or malignancy (NILM)   HPVHIGH Negative 06/14/2023 0945   ADEQPAP Satisfactory  for evaluation.  The absence of an 06/14/2023 0945   ADEQPAP  06/14/2023 0945    endocervical/transformation zone component is not uncommon in pregnant   ADEQPAP patients. 06/14/2023 0945    High Risk HPV: Positive  Adequacy:  Satisfactory for evaluation, transformation zone component PRESENT  Diagnosis:  Atypical squamous cells of undetermined significance (ASC-US)  Last mammogram: n/a   Review of Systems:  Pertinent items are noted in HPI. Comprehensive review of systems was otherwise negative.   Objective:  Physical Exam BP 119/74   Pulse 96   Wt 198 lb (89.8 kg)   LMP 11/28/2022 (Exact Date)   Breastfeeding No   BMI 36.21 kg/m    Physical Exam Vitals and nursing note reviewed. Exam conducted with a chaperone present.  Constitutional:      Appearance: Normal appearance.  HENT:     Head: Normocephalic and atraumatic.  Pulmonary:     Effort: Pulmonary effort is normal.  Chest:  Breasts:    Right: Mass and tenderness present.    Abdominal:     Comments: ~2cm mildly erythematous, tender macule without any underlying mass or fluctuance in LLQ & similar, less erythematous lesion in RLQ  Skin:    General: Skin is warm and dry.  Neurological:     General: No focal deficit present.     Mental Status: She is alert.  Psychiatric:        Mood and Affect: Mood normal.        Behavior: Behavior normal.        Thought Content: Thought content normal.        Judgment: Judgment normal.      Labs and Imaging CT ABDOMEN WO CONTRAST Result Date: 02/01/2024 CLINICAL DATA:  Abdominal pain.  Previous hernia repair. EXAM: CT ABDOMEN WITHOUT CONTRAST TECHNIQUE: Multidetector CT imaging of the abdomen was performed following the standard protocol without IV contrast. RADIATION DOSE REDUCTION: This exam was performed according to the departmental dose-optimization program which includes automated exposure control, adjustment of the mA and/or kV according to patient size and/or use  of iterative reconstruction technique. COMPARISON:  None Available. FINDINGS: Lower chest: No acute abnormality. Hepatobiliary: No focal liver abnormality is seen. No gallstones, gallbladder wall thickening, or biliary dilatation. Pancreas: Unremarkable. No pancreatic ductal dilatation or surrounding inflammatory changes. Spleen: Normal in size without focal abnormality. Adrenals/Urinary Tract: Adrenal glands are unremarkable. Kidneys are normal, without renal calculi, focal lesion, or hydronephrosis. Stomach/Bowel: Stomach is within normal limits. Appendix not visualized. No evidence of bowel wall thickening, distention, or inflammatory changes. Vascular/Lymphatic: No significant vascular findings are present. No enlarged abdominal or pelvic lymph nodes. Other: No abdominal wall hernia or abnormality. Musculoskeletal: No acute or significant osseous findings. IMPRESSION: Unremarkable examination of the abdomen. Electronically Signed   By: Layla Maw M.D.   On: 02/01/2024 18:45       Assessment & Plan:     Sterilization Consult She opted for permanent sterilization via laparoscopic salpingectomy, understanding its irreversibility and potential to reduce ovarian cancer risk. Informed of risks including bleeding, pelvic organ injury, and possible additional surgery. Consented to blood transfusion and resuscitation if needed. - Schedule laparoscopic salpingectomy. - Surgical schedule request submitted -  Advise on postoperative pain management with acetaminophen, ibuprofen, or oxycodone.   She desires permanent sterilization. Discussed alternatives including LARC options and vasectomy. She declines these options. Discussed surgery of salpingectomy vs tubal ligation. She would like to do a salpingectomy.  Risks of surgery include but are not limited to: bleeding, infection, injury to surrounding organs/tissues (i.e. bowel/bladder/ureters), need for additional procedures, wound complications, hospital  re-admission, regret,  conversion to open surgery, and VTE.  Reviewed restrictions and recovery following surgery   Right breast mass Painful right breast mass of unknown etiology - Order breast ultrasound to evaluate the left breast mass.  Abdominal bumps Persistent, painless abdominal bumps with unclear etiology despite normal CT scans. - Refer to dermatology for further evaluation of abdominal bumps.      Routine preventative health maintenance measures emphasized.  Lorriane Shire, MD Minimally Invasive Gynecologic Surgery Center for [facility], [facility]

## 2024-02-13 ENCOUNTER — Encounter: Payer: Self-pay | Admitting: Obstetrics and Gynecology

## 2024-02-13 ENCOUNTER — Other Ambulatory Visit: Payer: Self-pay | Admitting: Obstetrics and Gynecology

## 2024-02-13 DIAGNOSIS — N6322 Unspecified lump in the left breast, upper inner quadrant: Secondary | ICD-10-CM

## 2024-02-27 ENCOUNTER — Other Ambulatory Visit

## 2024-02-27 ENCOUNTER — Encounter

## 2024-03-18 ENCOUNTER — Encounter

## 2024-03-18 ENCOUNTER — Other Ambulatory Visit

## 2024-03-27 ENCOUNTER — Encounter: Payer: Self-pay | Admitting: *Deleted

## 2024-03-27 ENCOUNTER — Telehealth: Payer: Self-pay | Admitting: *Deleted

## 2024-03-27 NOTE — Telephone Encounter (Signed)
 Patient desires BTL. Notified by OR scheduler her BTL consent has expired. I called [name] and left a message we are trying to reach you with important information and am sending  a MyChart message. Please read and respond or call us  if you cannot read your messages. [name] [name]

## 2024-04-01 NOTE — Telephone Encounter (Signed)
 Patient has read Mychart message sent, she has not responded.

## 2024-04-02 ENCOUNTER — Encounter

## 2024-04-02 ENCOUNTER — Inpatient Hospital Stay: Admission: RE | Admit: 2024-04-02 | Source: Ambulatory Visit

## 2024-04-15 ENCOUNTER — Encounter: Payer: Self-pay | Admitting: *Deleted

## 2024-06-29 ENCOUNTER — Ambulatory Visit: Admitting: Dermatology

## 2024-06-29 ENCOUNTER — Encounter: Payer: Self-pay | Admitting: Dermatology

## 2024-06-29 DIAGNOSIS — Z8719 Personal history of other diseases of the digestive system: Secondary | ICD-10-CM

## 2024-06-29 DIAGNOSIS — L906 Striae atrophicae: Secondary | ICD-10-CM

## 2024-06-29 NOTE — Progress Notes (Signed)
   New Patient Visit   Subjective  [name] [name] is a 33 y.o. female who presents for a NEW PATIENT appointment to be examined for the concerns as listed below.  Spot Check:  located at the stomach that presented 1.5 years ago that started during pregnancy. The area is consistantly painful - rating it 8 out of 10. She has previously been treated for these areas by OBGYN but was referred to dermatology.  History of umbilical hernia, previously treated with general surgery.   Patient denied Hx of Bx. Patient denied family Hx of skin cancer.   The following portions of the chart were reviewed this encounter and updated as appropriate: medications, allergies, medical history  Review of Systems:  No other skin or systemic complaints except as noted in HPI or Assessment and Plan.  Objective  Well appearing patient in no apparent distress; mood and affect are within normal limits.  A focused examination was performed of the following areas: abdomen  Abdomen  Relevant exam findings are noted in the Assessment and Plan.          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   Documentation: I have reviewed the above documentation for accuracy and completeness, and I agree with the above.  I, Shirron Maranda, CMA, am acting as Neurosurgeon for [employer], DO.   RUFUS CHRISTELLA HOLY, MD

## 2024-08-04 ENCOUNTER — Telehealth: Payer: Self-pay | Admitting: Family Medicine

## 2024-08-04 ENCOUNTER — Telehealth: Payer: Self-pay

## 2024-08-04 NOTE — Telephone Encounter (Signed)
 Good Afternoon, this patient is wanting to know the status of her surgery (Tubal). She said she has signed the paper twice and she wants to make sure that the consent does not expire again.

## 2024-08-04 NOTE — Telephone Encounter (Signed)
 I called patient to advise her of an surgery opening w/ Dr. Jeralyn on 08/31/24 at 8:30 am @[facility]. I left a voicemail providing details and requesting a call back at [phone]. I also sent the patient a message via mychart.

## 2024-08-05 ENCOUNTER — Telehealth: Payer: Self-pay

## 2024-08-05 NOTE — Telephone Encounter (Signed)
 Second attempt to reach patient to schedule surgery. I left a second voicemail asking patient to call me back to confirm her availability for 08/31/24.

## 2024-09-16 ENCOUNTER — Telehealth: Payer: Self-pay

## 2024-09-16 NOTE — Telephone Encounter (Signed)
 Third attempt to reach patient to schedule her procedure with Dr. Jeralyn. I notified patient that her Medicaid consent form was expiring on 09/30/24, also that Dr. Jeralyn had an afternoon spot available on 11/3. Patient states she is planning to move and prefers to wait. Patient agreed to call me once she re-signs the Medicaid consent form.
